# Patient Record
Sex: Female | Born: 1960 | Race: Asian | Hispanic: No | Marital: Married | Smoking: Never smoker
Health system: Southern US, Community
[De-identification: ages and names within clinical notes are randomized; demographics above are authoritative.]

## PROBLEM LIST (undated history)

## (undated) DIAGNOSIS — M1990 Unspecified osteoarthritis, unspecified site: Secondary | ICD-10-CM

## (undated) DIAGNOSIS — R002 Palpitations: Secondary | ICD-10-CM

## (undated) DIAGNOSIS — J45909 Unspecified asthma, uncomplicated: Secondary | ICD-10-CM

## (undated) DIAGNOSIS — K5900 Constipation, unspecified: Secondary | ICD-10-CM

## (undated) DIAGNOSIS — Z8709 Personal history of other diseases of the respiratory system: Secondary | ICD-10-CM

## (undated) DIAGNOSIS — N979 Female infertility, unspecified: Secondary | ICD-10-CM

## (undated) DIAGNOSIS — Z8619 Personal history of other infectious and parasitic diseases: Secondary | ICD-10-CM

## (undated) DIAGNOSIS — L409 Psoriasis, unspecified: Secondary | ICD-10-CM

## (undated) DIAGNOSIS — Z8489 Family history of other specified conditions: Secondary | ICD-10-CM

## (undated) DIAGNOSIS — R51 Headache: Secondary | ICD-10-CM

## (undated) DIAGNOSIS — F329 Major depressive disorder, single episode, unspecified: Secondary | ICD-10-CM

## (undated) DIAGNOSIS — M62838 Other muscle spasm: Secondary | ICD-10-CM

## (undated) DIAGNOSIS — R05 Cough: Secondary | ICD-10-CM

## (undated) DIAGNOSIS — K219 Gastro-esophageal reflux disease without esophagitis: Secondary | ICD-10-CM

## (undated) DIAGNOSIS — L4050 Arthropathic psoriasis, unspecified: Secondary | ICD-10-CM

## (undated) DIAGNOSIS — T7840XA Allergy, unspecified, initial encounter: Secondary | ICD-10-CM

## (undated) DIAGNOSIS — K829 Disease of gallbladder, unspecified: Secondary | ICD-10-CM

## (undated) DIAGNOSIS — R519 Headache, unspecified: Secondary | ICD-10-CM

## (undated) DIAGNOSIS — M2540 Effusion, unspecified joint: Secondary | ICD-10-CM

## (undated) DIAGNOSIS — M2550 Pain in unspecified joint: Secondary | ICD-10-CM

## (undated) DIAGNOSIS — R059 Cough, unspecified: Secondary | ICD-10-CM

## (undated) DIAGNOSIS — F32A Depression, unspecified: Secondary | ICD-10-CM

## (undated) DIAGNOSIS — I10 Essential (primary) hypertension: Secondary | ICD-10-CM

## (undated) DIAGNOSIS — M7989 Other specified soft tissue disorders: Secondary | ICD-10-CM

## (undated) DIAGNOSIS — J189 Pneumonia, unspecified organism: Secondary | ICD-10-CM

## (undated) DIAGNOSIS — R0602 Shortness of breath: Secondary | ICD-10-CM

## (undated) DIAGNOSIS — G8929 Other chronic pain: Secondary | ICD-10-CM

## (undated) DIAGNOSIS — M549 Dorsalgia, unspecified: Secondary | ICD-10-CM

## (undated) DIAGNOSIS — F419 Anxiety disorder, unspecified: Secondary | ICD-10-CM

## (undated) DIAGNOSIS — R7303 Prediabetes: Secondary | ICD-10-CM

## (undated) DIAGNOSIS — M797 Fibromyalgia: Secondary | ICD-10-CM

## (undated) DIAGNOSIS — E559 Vitamin D deficiency, unspecified: Secondary | ICD-10-CM

## (undated) DIAGNOSIS — D649 Anemia, unspecified: Secondary | ICD-10-CM

## (undated) DIAGNOSIS — E039 Hypothyroidism, unspecified: Secondary | ICD-10-CM

## (undated) HISTORY — DX: Shortness of breath: R06.02

## (undated) HISTORY — PX: DILATION AND CURETTAGE OF UTERUS: SHX78

## (undated) HISTORY — DX: Anemia, unspecified: D64.9

## (undated) HISTORY — DX: Vitamin D deficiency, unspecified: E55.9

## (undated) HISTORY — DX: Unspecified asthma, uncomplicated: J45.909

## (undated) HISTORY — PX: DIAGNOSTIC LAPAROSCOPY: SUR761

## (undated) HISTORY — DX: Psoriasis, unspecified: L40.9

## (undated) HISTORY — DX: Female infertility, unspecified: N97.9

## (undated) HISTORY — DX: Constipation, unspecified: K59.00

## (undated) HISTORY — DX: Hypothyroidism, unspecified: E03.9

## (undated) HISTORY — PX: APPENDECTOMY: SHX54

## (undated) HISTORY — DX: Other specified soft tissue disorders: M79.89

## (undated) HISTORY — DX: Depression, unspecified: F32.A

## (undated) HISTORY — PX: REPLACEMENT TOTAL KNEE: SUR1224

## (undated) HISTORY — DX: Prediabetes: R73.03

## (undated) HISTORY — DX: Arthropathic psoriasis, unspecified: L40.50

## (undated) HISTORY — DX: Palpitations: R00.2

## (undated) HISTORY — DX: Disease of gallbladder, unspecified: K82.9

## (undated) HISTORY — DX: Dorsalgia, unspecified: M54.9

## (undated) HISTORY — DX: Gastro-esophageal reflux disease without esophagitis: K21.9

## (undated) HISTORY — DX: Anxiety disorder, unspecified: F41.9

## (undated) HISTORY — PX: TONSILLECTOMY: SUR1361

---
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
## 1998-07-12 ENCOUNTER — Other Ambulatory Visit: Admission: RE | Admit: 1998-07-12 | Discharge: 1998-07-12 | Payer: Self-pay | Admitting: Obstetrics and Gynecology

## 1998-08-12 ENCOUNTER — Ambulatory Visit (HOSPITAL_COMMUNITY): Admission: RE | Admit: 1998-08-12 | Discharge: 1998-08-12 | Payer: Self-pay | Admitting: Obstetrics and Gynecology

## 1998-12-01 ENCOUNTER — Ambulatory Visit (HOSPITAL_COMMUNITY): Admission: RE | Admit: 1998-12-01 | Discharge: 1998-12-01 | Payer: Self-pay | Admitting: Internal Medicine

## 1998-12-02 ENCOUNTER — Encounter: Payer: Self-pay | Admitting: Internal Medicine

## 1999-07-20 ENCOUNTER — Other Ambulatory Visit: Admission: RE | Admit: 1999-07-20 | Discharge: 1999-07-20 | Payer: Self-pay | Admitting: Obstetrics and Gynecology

## 1999-09-17 ENCOUNTER — Encounter (INDEPENDENT_AMBULATORY_CARE_PROVIDER_SITE_OTHER): Payer: Self-pay

## 1999-09-17 ENCOUNTER — Ambulatory Visit (HOSPITAL_COMMUNITY): Admission: RE | Admit: 1999-09-17 | Discharge: 1999-09-17 | Payer: Self-pay | Admitting: Obstetrics and Gynecology

## 2000-07-25 ENCOUNTER — Other Ambulatory Visit: Admission: RE | Admit: 2000-07-25 | Discharge: 2000-07-25 | Payer: Self-pay | Admitting: Obstetrics and Gynecology

## 2001-08-22 ENCOUNTER — Other Ambulatory Visit: Admission: RE | Admit: 2001-08-22 | Discharge: 2001-08-22 | Payer: Self-pay | Admitting: Obstetrics and Gynecology

## 2006-07-10 HISTORY — PX: CHOLECYSTECTOMY: SHX55

## 2006-09-21 ENCOUNTER — Encounter: Admission: RE | Admit: 2006-09-21 | Discharge: 2006-09-21 | Payer: Self-pay | Admitting: Gastroenterology

## 2006-10-23 ENCOUNTER — Ambulatory Visit (HOSPITAL_COMMUNITY): Admission: RE | Admit: 2006-10-23 | Discharge: 2006-10-23 | Payer: Self-pay | Admitting: General Surgery

## 2006-10-23 ENCOUNTER — Encounter (INDEPENDENT_AMBULATORY_CARE_PROVIDER_SITE_OTHER): Payer: Self-pay | Admitting: Specialist

## 2007-06-08 ENCOUNTER — Emergency Department (HOSPITAL_COMMUNITY): Admission: EM | Admit: 2007-06-08 | Discharge: 2007-06-08 | Payer: Self-pay | Admitting: *Deleted

## 2008-05-17 ENCOUNTER — Encounter: Admission: RE | Admit: 2008-05-17 | Discharge: 2008-05-17 | Payer: Self-pay | Admitting: Orthopedic Surgery

## 2008-05-29 ENCOUNTER — Ambulatory Visit (HOSPITAL_BASED_OUTPATIENT_CLINIC_OR_DEPARTMENT_OTHER): Admission: RE | Admit: 2008-05-29 | Discharge: 2008-05-29 | Payer: Self-pay | Admitting: Orthopedic Surgery

## 2008-09-04 ENCOUNTER — Encounter: Admission: RE | Admit: 2008-09-04 | Discharge: 2008-09-04 | Payer: Self-pay | Admitting: Occupational Medicine

## 2008-10-09 ENCOUNTER — Emergency Department (HOSPITAL_COMMUNITY): Admission: EM | Admit: 2008-10-09 | Discharge: 2008-10-09 | Payer: Self-pay | Admitting: Emergency Medicine

## 2009-07-10 HISTORY — PX: KNEE ARTHROSCOPY: SHX127

## 2009-07-26 ENCOUNTER — Ambulatory Visit (HOSPITAL_COMMUNITY): Admission: RE | Admit: 2009-07-26 | Discharge: 2009-07-26 | Payer: Self-pay | Admitting: Surgery

## 2009-07-30 ENCOUNTER — Ambulatory Visit (HOSPITAL_COMMUNITY): Admission: RE | Admit: 2009-07-30 | Discharge: 2009-07-30 | Payer: Self-pay | Admitting: Surgery

## 2010-07-31 ENCOUNTER — Encounter: Payer: Self-pay | Admitting: Gastroenterology

## 2010-11-22 NOTE — Op Note (Signed)
NAME:  Natasha Lyons, Natasha Lyons          ACCOUNT NO.:  [id number]   MEDICAL RECORD NO.:  [id number]          PATIENT TYPE:  AMB   LOCATION:  DSC                          FACILITY:  [facility]   PHYSICIAN:  Harvie Junior, M.D.   DATE OF BIRTH:  1960-10-13   DATE OF PROCEDURE:  05/29/2008  DATE OF DISCHARGE:                               OPERATIVE REPORT   PREOPERATIVE DIAGNOSIS:  Medial meniscal tear and lateral meniscus tear.   POSTOPERATIVE DIAGNOSES:  1. Lateral meniscal tear.  2. Medial femoral condylar defect.  3. Chondromalacia of the trochlea.   PROCEDURES:  1. Partial lateral meniscectomy.  2. Medial femoral chondroplasty.  3. Chondroplasty of the trochlea.   SURGEON:  Harvie Junior, MD   ASSISTANT:  Marshia Ly, PA   ANESTHESIA:  General.   BRIEF HISTORY:  Ms. Belleville is a 50 year old female with a history of  having had significant left knee pain.  We treated conservatively for a  period of time.  Because of continued complaints of left knee  pain, she  was taken to the operating room for operative knee arthroscopy.  After  failure of conservative care, she was brought to the operating room for  this procedure.   PROCEDURE IN DETAIL:  The patient was taken to the operating room.  After adequate anesthesia obtained with general anesthetic, the patient  was placed supine on the operating room table.  The left leg was prepped  and draped in usual sterile fashion.  Following the routine arthroscopic  examination, the knee revealed there was medial femoral condylar defect.  This was debrided with a smooth and stable rim back to a smooth surface.  Once this was completed, attention was turned to the medial meniscus,  which was probed at length.  As I had talked about some undersurface  tears on her MRI.  The posterior horn was probed at length and felt to  be within normal limits.  There was some degenerative feel, but  certainly not a frank tear and felt giving her size and  the condylar  defect that she needed to preserve the meniscus and at this point, no  action was taken at the meniscus.  Attention was turned to the ACL,  which was normal.  Attention was turned to the lateral meniscus where  MRI talked about the mid body changes, the mid body was fine, was probed  at length.  There was a small tear of the posterior horn right at the  root, which was debrided.  Attention was turned to the lateral femoral  condyle, which had no significant condylar defect.  Attention was turned  up to the patellofemoral joint where there was chondral injury of the  patellofemoral trochlea, which was debrided with a suction shaver.  Once  this was completed, a final check was made for loosening and fragment  pieces and seeing none, the knee was copiously and thoroughly irrigated  and suctioned dry.  The  arthroscopic portals were closed with bandage.  Sterile compression  dressing was applied and the patient was taken to the recovery room and  was noted to be  in satisfactory condition.   Estimated blood loss for the procedure was none.      Harvie Junior, M.D.  Electronically Signed     JLG/MEDQ  D:  05/29/2008  T:  05/29/2008  Job:  [id number]

## 2010-11-25 NOTE — Op Note (Signed)
NAME:  Natasha Lyons, Natasha Lyons          ACCOUNT NO.:  [id number]   MEDICAL RECORD NO.:  [id number]          PATIENT TYPE:  AMB   LOCATION:  DAY                          FACILITY:  [facility]   PHYSICIAN:  Timothy E. Earlene Plater, M.D. DATE OF BIRTH:  Jun 15, 1961   DATE OF PROCEDURE:  10/23/2006  DATE OF DISCHARGE:                               OPERATIVE REPORT   PREOPERATIVE DIAGNOSIS:  Cholecystolithiasis.   POSTOPERATIVE DIAGNOSIS:  Cholecystolithiasis.   PROCEDURE:  Laparoscopic cholecystectomy with cholangiogram.   SURGEON:  Timothy E. Earlene Plater, M.D.   ASSISTANT:  Clovis Pu. Cornett, M.D.   ANESTHESIA:  Generalized.   INDICATION:  The patient is symptomatic with gallstones, known obesity,  hypertension, and other stable medical conditions.  She has agreed to  proceed with this operation as planned.  She was seen and identified,  and the permit signed.   PROCEDURE IN DETAIL:  The patient was taken to the operating room and  placed supine.  General endotracheal anesthesia was administered.  The  abdomen was prepped and draped in the usual fashion.  I elected to go  above the umbilicus after infiltrating the skin.  The fascia was  identified and opened in the midline and the peritoneum entered.  I did  buttonhole the umbilical skin, which I repaired with a 3-0 Monocryl.  The [facility] catheter was placed, and tied in place with a #1 Vicryl.  The  abdomen was insufflated.  General peritoneoscopy was unremarkable,  except for a chronically inflamed gallbladder and some adhesions to the  lower midline.  Attention was turned to the gallbladder.  A second 10-mm  trocar in midepigastrium, two 5-mm trocars in the right upper quadrant.  The gallbladder was grasped, placed on tension.  Careful dissection at  the base of the gallbladder revealed a normal-appearing cystic duct.  It  was completely dissected out as well behind the cystic artery.  A clear  window was seen.  A clip was placed on the gallbladder  side of the  cystic duct.  The cystic duct was opened.  Its lumen was tiny, perhaps 1  mm.  I was able to thread a catheter past percutaneously into the cystic  duct remnant and a clip applied and real time cholangiography showing a  rapid and complete filling of the biliary tree and into the duodenum.  The clip and catheter were removed.  The stump of the cystic duct was  doubly clipped and divided.  The cystic artery was triply clipped and  divided.  The gallbladder was dissected from the gallbladder bed without  incident or complication.  The gallbladder bed was carefully checked and  cautery used where necessary.  The gallbladder was placed in an  EndoCatch bag, and then that was passed from the abdomen through the  supraumbilical incision.  That incision was  closed with the existing #1 Vicryl.  All areas were checked, and then  all irrigant, CO2, instruments, and trocars were removed under direct  vision.  the wounds were closed with momocrile. pt removed to the  recovery room.  instructions were given and she will be followed as op  Timothy E. Earlene Plater, M.D.  Electronically Signed     TED/MEDQ  D:  10/23/2006  T:  10/23/2006  Job:  [id number]

## 2010-11-25 NOTE — Op Note (Signed)
[facility]  Patient:    Natasha Lyons, Natasha Lyons                 MRN: [id number] Proc. Date: 09/17/99 Adm. Date:  [id number] Disc. Date: [id number] Attending:  Cordelia Pen Ii                           Operative Report  PREOPERATIVE DIAGNOSIS:       Endometriosis.  Menorrhagia.  POSTOPERATIVE DIAGNOSIS:      Endometriosis.  Pelvic adhesions.  Endometrial polyps.  OPERATION:                    Laparoscopy with ablation of endometriosis and lysis of adhesions.  Hysteroscopy with dilatation and curettage.  SURGEON:                      Guy Sandifer. Arleta Creek, M.D.  ASSISTANT:  ANESTHESIA:                   General with endotracheal intubation by Octaviano Glow.  Pamalee Leyden, M.D.  ESTIMATED BLOOD LOSS:         Less than 50 cc.  SORBITOL:                     I&Os are even.  INDICATIONS:                  This patient is a 50 year old married black female, gravida 0, para 0, with known endometriosis, increasing pelvic pain, and menorrhagia.  Details are dictated in the history and physical.  Hysteroscopy with D&C and laparoscopy is discussed with the patient.  Possible risks and complications have been discussed including, but not limited to, infection, uterine perforation, bowel, bladder, or ureteral damage, bleeding requiring transfusion of blood products with possible transfusion reactive, HIV, and hepatitis acquisition, DVT, PE, and pneumonia, and hysterectomy.  All questions have been answered and  consent is signed on the chart.  FINDINGS:                     The uterine cavity has multiple polypoid structures approximately 5 to 8 mm in length.  These are found in the uterine fundus as well as bilaterally on the superior lateral walls of the endometrial canal.  The upper abdomen is normal.  There are omental adhesions to the anterior abdominal wall ver the site of her previous appendectomy.  The uterus is upper limits of  normal size, smooth in contour.  The anterior cul-de-sac is normal.  The right tube and ovary are normal.  The left tube contains a small hydatid cyst.  The left pelvic side  wall contains several small white patches of endometriosis.  The right pelvic side wall contains a single raised, berry-shaped lesion of endometriosis approximately 5 mm in diameter.  The posterior cul-de-sac is normal.  DESCRIPTION OF PROCEDURE:     The patient is taken to the operating room where he is placed in the dorsal supine position.  General anesthesia is then induced via endotracheal intubation.  She is then placed in the dorsal lithotomy position where she is prepped abdominally, vaginally, the bladder is straight catheterized, and she is draped in a sterile fashion.  Bivalve speculum is placed in the vagina and the cervix is injected with 1% Xylocaine and then grasped with a single tooth tenaculum.  Paracervical block is placed at  the 2, 4, 5, 7, 8, and 10 oclock positions with 1% Xylocaine.  The cervix is gently and progressively dilated to a 29 Pratt dilator.  Diagnostic hysteroscope is then placed in the cervix and advanced under direct visualization using Sorbitol distending media.  The above  findings are noted.  The hysteroscope is withdrawn and thorough sharp curettage is done.  Reinspection with the hysteroscope reveals two or three small polypoid structures remaining.  Sharp curettage is then again done.  Final inspection with the hysteroscope reveals that all the polypoid structures have been removed in their entirety.  Hulka tenaculum is then placed in the uterus as a manipulator.  The single tooth tenaculum is removed and attention is turned to the abdomen. Small infraumbilical incision is made and  10/11 mm disposable trocar sleeve is  placed without difficulty on the first attempt.  Placement is verified with the  laparoscope and no damage to the surrounding structures is  noted.  The pneumoperitoneum is induced.  A small suprapubic incision is made and a 5 mm nondisposable trocar sleeve is placed under direct visualization without difficulty. The above findings are noted.  The adhesions to the anterior abdominal wall are cauterized with bipolar cautery at their point of insertion into the anterior abdominal wall and taken down sharply.  Good hemostasis is obtained. Irrigation is carried out and excess fluid is removed and good hemostasis is again noted at all sites.  The above findings are then noted in the pelvis.  Course of the ureters is carefully identified bilaterally and bipolar cautery is used to ablate the areas of endometriosis.  The hydatid cyst on the left tube is also cauterized.  Excess fluid is removed.  The suprapubic trocar sleeve is removed, the pneumoperitoneum is reduced, and no bleeding is noted.  The umbilical trocar sleeve is removed and the skin incisions are closed with subcuticular 3-0 Vicryl suture. The incisions are injected with 0.5% plain Marcaine.  The Hulka tenaculum is removed and no bleeding is noted.  All counts are correct.  The patient is awakened and taken to the recovery room in stable condition. DD:  09/17/99 TD:  09/18/99 Job: [id number]

## 2011-04-11 LAB — BASIC METABOLIC PANEL
BUN: 7
CO2: 27
Calcium: 9
Chloride: 106
Creatinine, Ser: 0.58
GFR calc Af Amer: >60
GFR calc non Af Amer: >60
Glucose, Bld: 105 — ABNORMAL HIGH
Potassium: 4
Sodium: 138

## 2011-04-11 LAB — GLUCOSE, CAPILLARY
Glucose-Capillary: 123 — ABNORMAL HIGH
Glucose-Capillary: 127 — ABNORMAL HIGH

## 2011-04-11 LAB — POCT HEMOGLOBIN-HEMACUE: Hemoglobin: 14.2

## 2012-06-27 ENCOUNTER — Other Ambulatory Visit: Payer: Self-pay | Admitting: Orthopedic Surgery

## 2012-06-27 DIAGNOSIS — M545 Low back pain, unspecified: Secondary | ICD-10-CM

## 2012-07-05 ENCOUNTER — Ambulatory Visit
Admission: RE | Admit: 2012-07-05 | Discharge: 2012-07-05 | Disposition: A | Discharge status: Home / Self Care | Payer: BC Managed Care – PPO | Source: Ambulatory Visit | Attending: Orthopedic Surgery | Admitting: Orthopedic Surgery

## 2012-07-05 DIAGNOSIS — M545 Low back pain, unspecified: Secondary | ICD-10-CM

## 2012-07-10 HISTORY — PX: BIOPSY THYROID: PRO38

## 2013-01-09 ENCOUNTER — Other Ambulatory Visit: Payer: Self-pay | Admitting: *Deleted

## 2013-01-09 MED ORDER — PHENTERMINE-TOPIRAMATE ER 7.5-46 MG PO CP24: 1.0000 | ORAL_CAPSULE | Freq: Every day | ORAL | Status: DC

## 2013-01-09 NOTE — Telephone Encounter (Signed)
Ok per Dr Lucianne Muss.

## 2013-02-24 ENCOUNTER — Other Ambulatory Visit: Payer: Self-pay | Admitting: *Deleted

## 2013-02-24 ENCOUNTER — Other Ambulatory Visit (INDEPENDENT_AMBULATORY_CARE_PROVIDER_SITE_OTHER): Payer: BC Managed Care – PPO

## 2013-02-24 DIAGNOSIS — R7303 Prediabetes: Secondary | ICD-10-CM

## 2013-02-24 DIAGNOSIS — E785 Hyperlipidemia, unspecified: Secondary | ICD-10-CM

## 2013-02-24 DIAGNOSIS — R7309 Other abnormal glucose: Secondary | ICD-10-CM

## 2013-02-24 DIAGNOSIS — E039 Hypothyroidism, unspecified: Secondary | ICD-10-CM

## 2013-02-24 LAB — LIPID PANEL
Cholesterol: 129 mg/dL (ref 0–200)
HDL: 31 mg/dL — ABNORMAL LOW (ref >39.00)
LDL Cholesterol: 84 mg/dL (ref 0–99)
Total CHOL/HDL Ratio: 4
Triglycerides: 68 mg/dL (ref 0.0–149.0)
VLDL: 13.6 mg/dL (ref 0.0–40.0)

## 2013-02-24 LAB — COMPREHENSIVE METABOLIC PANEL
ALT: 24 U/L (ref 0–35)
AST: 23 U/L (ref 0–37)
Albumin: 4 g/dL (ref 3.5–5.2)
Alkaline Phosphatase: 57 U/L (ref 39–117)
BUN: 9 mg/dL (ref 6–23)
CO2: 25 mEq/L (ref 19–32)
Calcium: 9 mg/dL (ref 8.4–10.5)
Chloride: 108 mEq/L (ref 96–112)
Creatinine, Ser: 0.6 mg/dL (ref 0.4–1.2)
GFR: 138.63 mL/min (ref >60.00)
Glucose, Bld: 107 mg/dL — ABNORMAL HIGH (ref 70–99)
Potassium: 4 mEq/L (ref 3.5–5.1)
Sodium: 138 mEq/L (ref 135–145)
Total Bilirubin: 0.6 mg/dL (ref 0.3–1.2)
Total Protein: 7.2 g/dL (ref 6.0–8.3)

## 2013-02-24 LAB — VITAMIN B12: Vitamin B-12: >1500 pg/mL — ABNORMAL HIGH (ref 211–911)

## 2013-02-24 LAB — HEMOGLOBIN A1C: Hgb A1c MFr Bld: 5.9 % (ref 4.6–6.5)

## 2013-02-24 LAB — T4, FREE: Free T4: 1.06 ng/dL (ref 0.60–1.60)

## 2013-02-24 LAB — TSH: TSH: 0.15 u[IU]/mL — ABNORMAL LOW (ref 0.35–5.50)

## 2013-02-25 ENCOUNTER — Ambulatory Visit: Payer: BC Managed Care – PPO | Admitting: Endocrinology

## 2013-02-25 LAB — VITAMIN D 25 HYDROXY (VIT D DEFICIENCY, FRACTURES): Vit D, 25-Hydroxy: 40 ng/mL (ref 30–89)

## 2013-02-27 ENCOUNTER — Encounter: Payer: Self-pay | Admitting: Endocrinology

## 2013-02-27 ENCOUNTER — Ambulatory Visit (INDEPENDENT_AMBULATORY_CARE_PROVIDER_SITE_OTHER): Payer: BC Managed Care – PPO | Admitting: Endocrinology

## 2013-02-27 VITALS — BP 138/70 | HR 86 | Temp 98.3°F | Resp 12 | Ht 64.0 in | Wt 224.2 lb

## 2013-02-27 DIAGNOSIS — I10 Essential (primary) hypertension: Secondary | ICD-10-CM

## 2013-02-27 DIAGNOSIS — R7309 Other abnormal glucose: Secondary | ICD-10-CM

## 2013-02-27 DIAGNOSIS — J45909 Unspecified asthma, uncomplicated: Secondary | ICD-10-CM

## 2013-02-27 DIAGNOSIS — E669 Obesity, unspecified: Secondary | ICD-10-CM

## 2013-02-27 DIAGNOSIS — R7303 Prediabetes: Secondary | ICD-10-CM | POA: Insufficient documentation

## 2013-02-27 DIAGNOSIS — E039 Hypothyroidism, unspecified: Secondary | ICD-10-CM

## 2013-02-27 NOTE — Progress Notes (Signed)
Patient ID: Natasha Lyons, female   DOB: 03-23-1961, 52 y.o.   MRN: [id number]   History of Present Illness:  She has had multiple medical problems and is here for followup:  1. OBESITY: Because of failure of diet and exercise to help her lose weight she was started on Qsymia a few months ago and has had excellent results with continued weight loss. Her maximum weight has been about 258 in the past. She has also been able to get more active with walking and running although unable to do this recently with joint pain. Has good portion control and has very little craving for sweets and carbohydrates well. Has lost 15 pounds in the last 2 months also. 2.  Prediabetes: She has been on metformin previously since 2009 with mostly impaired fasting glucose and normal A1c but recently has stopped taking this on her own. However fasting glucose is still relatively high at 107 3. HYPOTHYROIDISM: She has had primary hypothyroidism since about 2006. She does feel subjectively better with taking Armour Thyroid compared to Synthroid. No unusual fatigue or cold intolerance. No palpitations but her TSH is now relatively low 4. HYPERTENSION: She has been on treatment for the last few years and has been on stable doses of Micardis. Blood pressure is higher and she has not taken her medication. Usually blood pressure at work or otherwise is 125-30/80. No dizziness. She does not know what her pulse is at home but it is relatively fast today  5. Vitamin B12 deficiency: This appears to have resolved with stopping metformin and level is normal      Medication List       This list is accurate as of: 02/27/13 11:59 PM.  Always use your most recent med list.               cetirizine 10 MG tablet  Commonly known as:  ZYRTEC  Take 10 mg by mouth daily.     etanercept 50 MG/ML injection  Commonly known as:  ENBREL  Inject 50 mg into the skin once a week.     mometasone 220 MCG/INH inhaler  Commonly known as:   ASMANEX  Inhale 2 puffs into the lungs daily.     Phentermine-Topiramate 7.5-46 MG Cp24  Commonly known as:  QSYMIA  Take 1 capsule by mouth daily.     PROVENTIL HFA 108 (90 BASE) MCG/ACT inhaler  Generic drug:  albuterol  Inhale 2 puffs into the lungs every 6 (six) hours as needed for wheezing.     telmisartan 40 MG tablet  Commonly known as:  MICARDIS  Take 40 mg by mouth daily.     thyroid 90 MG tablet  Commonly known as:  ARMOUR  Take 90 mg by mouth daily.     vitamin B-12 500 MCG tablet  Commonly known as:  CYANOCOBALAMIN  Take 500 mcg by mouth daily.     Vitamin D (Ergocalciferol) 50000 UNITS Caps capsule  Commonly known as:  DRISDOL  Take 50,000 Units by mouth.        Allergies: Allergies not on file  No past medical history on file.  No past surgical history on file.  No family history on file.  Social History:  reports that she has never smoked. She has never used smokeless tobacco. Her alcohol and drug histories are not on file.  Review of Systems -   Has had history of severe psoriasis, is on treatment History of vitamin D deficiency, taking 50,000 units  weekly Had low HDL, LDL is normal   Appointment on 02/24/2013  Component Date Value Range Status  . Hemoglobin A1C 02/24/2013 5.9  4.6 - 6.5 % Final   Glycemic Control Guidelines for People with Diabetes:Non Diabetic:  <6%Goal of Therapy: <7%Additional Action Suggested:  >8%   . Sodium 02/24/2013 138  135 - 145 mEq/L Final  . Potassium 02/24/2013 4.0  3.5 - 5.1 mEq/L Final  . Chloride 02/24/2013 108  96 - 112 mEq/L Final  . CO2 02/24/2013 25  19 - 32 mEq/L Final  . Glucose, Bld 02/24/2013 107* 70 - 99 mg/dL Final  . BUN 16/04/9603 9  6 - 23 mg/dL Final  . Creatinine, Ser 02/24/2013 0.6  0.4 - 1.2 mg/dL Final  . Total Bilirubin 02/24/2013 0.6  0.3 - 1.2 mg/dL Final  . Alkaline Phosphatase 02/24/2013 57  39 - 117 U/L Final  . AST 02/24/2013 23  0 - 37 U/L Final  . ALT 02/24/2013 24  0 - 35 U/L  Final  . Total Protein 02/24/2013 7.2  6.0 - 8.3 g/dL Final  . Albumin 54/03/8118 4.0  3.5 - 5.2 g/dL Final  . Calcium 14/78/2956 9.0  8.4 - 10.5 mg/dL Final  . GFR 21/30/8657 138.63  >60.00 mL/min Final  . Vit D, 25-Hydroxy 02/24/2013 40  30 - 89 ng/mL Final   Comment: This assay accurately quantifies Vitamin D, which is the sum of the                          25-Hydroxy forms of Vitamin D2 and D3.  Studies have shown that the                          optimum concentration of 25-Hydroxy Vitamin D is 30 ng/mL or higher.                           Concentrations of Vitamin D between 20 and 29 ng/mL are considered to                          be insufficient and concentrations less than 20 ng/mL are considered                          to be deficient for Vitamin D.  . Vitamin B-12 02/24/2013 >1500* 211 - 911 pg/mL Final  . Free T4 02/24/2013 1.06  0.60 - 1.60 ng/dL Final  . TSH 84/69/6295 0.15* 0.35 - 5.50 uIU/mL Final  . Cholesterol 02/24/2013 129  0 - 200 mg/dL Final   ATP III Classification       Desirable:  < 200 mg/dL               Borderline High:  200 - 239 mg/dL          High:  > = 284 mg/dL  . Triglycerides 02/24/2013 68.0  0.0 - 149.0 mg/dL Final   Normal:  <132 mg/dLBorderline High:  150 - 199 mg/dL  . HDL 02/24/2013 31.00* >39.00 mg/dL Final  . VLDL 44/07/270 13.6  0.0 - 40.0 mg/dL Final  . LDL Cholesterol 02/24/2013 84  0 - 99 mg/dL Final  . Total CHOL/HDL Ratio 02/24/2013 4   Final  Men          Women1/2 Average Risk     3.4          3.3Average Risk          5.0          4.42X Average Risk          9.6          7.13X Average Risk          15.0          11.0                        EXAM:  BP 138/70  Pulse 86  Temp(Src) 98.3 F (36.8 C)  Resp 12  Ht 5\' 4"  (1.626 m)  Wt 224 lb 3.2 oz (101.696 kg)  BMI 38.46 kg/m2  SpO2 99%  LMP 02/07/2013 Pulse 76 on repeat measurement No pedal edema  Assessment/Plan:   See history of present illness for list of  problems. Changes made to her regimen: She will leave off B12 as the level is now normal without taking metformin. A1c is upper normal She will reduce her Armour Thyroid by half tablet twice a week. Resume exercise, avoid running which will affect her knees Consider lipoprotein NMR analysis Followup TSH short-term Consider reducing dose of Qsymia   Bjorn Hallas 03/06/2013, 7:56 AM

## 2013-02-27 NOTE — Patient Instructions (Addendum)
Armour thyroid 1/2  Twice a week and 1 daily other days  Watch pulse and call if consistently over 80  Monitor blood pressure at least every other week  Resume his walking

## 2013-03-06 ENCOUNTER — Encounter: Payer: Self-pay | Admitting: Endocrinology

## 2013-03-06 DIAGNOSIS — E039 Hypothyroidism, unspecified: Secondary | ICD-10-CM | POA: Insufficient documentation

## 2013-03-06 DIAGNOSIS — J45909 Unspecified asthma, uncomplicated: Secondary | ICD-10-CM | POA: Insufficient documentation

## 2013-03-06 DIAGNOSIS — E669 Obesity, unspecified: Secondary | ICD-10-CM | POA: Insufficient documentation

## 2013-03-06 DIAGNOSIS — I10 Essential (primary) hypertension: Secondary | ICD-10-CM | POA: Insufficient documentation

## 2013-03-19 ENCOUNTER — Other Ambulatory Visit: Payer: Self-pay | Admitting: Internal Medicine

## 2013-03-19 DIAGNOSIS — E010 Iodine-deficiency related diffuse (endemic) goiter: Secondary | ICD-10-CM

## 2013-03-26 ENCOUNTER — Ambulatory Visit
Admission: RE | Admit: 2013-03-26 | Discharge: 2013-03-26 | Disposition: A | Discharge status: Home / Self Care | Payer: BC Managed Care – PPO | Source: Ambulatory Visit | Attending: Internal Medicine | Admitting: Internal Medicine

## 2013-03-26 DIAGNOSIS — E010 Iodine-deficiency related diffuse (endemic) goiter: Secondary | ICD-10-CM

## 2013-04-08 ENCOUNTER — Other Ambulatory Visit: Payer: Self-pay | Admitting: Internal Medicine

## 2013-04-08 DIAGNOSIS — E041 Nontoxic single thyroid nodule: Secondary | ICD-10-CM

## 2013-04-09 ENCOUNTER — Telehealth: Payer: Self-pay | Admitting: *Deleted

## 2013-04-09 ENCOUNTER — Ambulatory Visit
Admission: RE | Admit: 2013-04-09 | Discharge: 2013-04-09 | Disposition: A | Discharge status: Home / Self Care | Payer: BC Managed Care – PPO | Source: Ambulatory Visit | Attending: Internal Medicine | Admitting: Internal Medicine

## 2013-04-09 ENCOUNTER — Other Ambulatory Visit (HOSPITAL_COMMUNITY)
Admission: RE | Admit: 2013-04-09 | Discharge: 2013-04-09 | Disposition: A | Discharge status: Home / Self Care | Payer: BC Managed Care – PPO | Source: Ambulatory Visit | Attending: Diagnostic Radiology | Admitting: Diagnostic Radiology

## 2013-04-09 DIAGNOSIS — E041 Nontoxic single thyroid nodule: Secondary | ICD-10-CM

## 2013-04-09 NOTE — Telephone Encounter (Signed)
Pts mom has an appt on Friday, wants to know if she can have her labs drawn on the same day?   Patient also says since she's lost so much weight her bp has come down and she's stopped taking her meds,  She also wants to know if she should wean herself off the Qysmia?  Please advise

## 2013-04-09 NOTE — Telephone Encounter (Signed)
Okay to have labs, will need followup visit however She can take Qsymia every other day and then stop after 10 days

## 2013-04-09 NOTE — Telephone Encounter (Signed)
What about her bp meds?

## 2013-04-09 NOTE — Telephone Encounter (Signed)
Noted, pt is aware 

## 2013-04-09 NOTE — Telephone Encounter (Signed)
None

## 2013-04-11 ENCOUNTER — Other Ambulatory Visit: Payer: Self-pay | Admitting: Endocrinology

## 2013-04-11 LAB — BASIC METABOLIC PANEL
BUN: 6 mg/dL (ref 6–23)
CO2: 25 mEq/L (ref 19–32)
Calcium: 9.6 mg/dL (ref 8.4–10.5)
Chloride: 106 mEq/L (ref 96–112)
Creat: 0.53 mg/dL (ref 0.50–1.10)
Glucose, Bld: 93 mg/dL (ref 70–99)
Potassium: 3.9 mEq/L (ref 3.5–5.3)
Sodium: 137 mEq/L (ref 135–145)

## 2013-04-12 LAB — TSH: TSH: 1.84 u[IU]/mL (ref 0.350–4.500)

## 2013-04-12 LAB — T4, FREE: Free T4: 1.12 ng/dL (ref 0.80–1.80)

## 2013-04-14 ENCOUNTER — Telehealth: Payer: Self-pay | Admitting: *Deleted

## 2013-04-14 NOTE — Telephone Encounter (Signed)
Message copied by Hermenia Bers on Mon Apr 14, 2013  5:17 PM ------      Message from: Reather Littler      Created: Sat Apr 12, 2013  4:35 PM       Labs okay but need to see her for followup office visit within the next week, next appointment is next month ------

## 2013-04-14 NOTE — Telephone Encounter (Signed)
Left message to return call 

## 2013-04-22 ENCOUNTER — Telehealth: Payer: Self-pay | Admitting: *Deleted

## 2013-04-22 NOTE — Telephone Encounter (Signed)
Left message to return call and schedule appt

## 2013-05-02 ENCOUNTER — Ambulatory Visit (INDEPENDENT_AMBULATORY_CARE_PROVIDER_SITE_OTHER): Payer: BC Managed Care – PPO | Admitting: Endocrinology

## 2013-05-02 ENCOUNTER — Encounter: Payer: Self-pay | Admitting: Endocrinology

## 2013-05-02 VITALS — BP 120/70 | HR 75 | Temp 97.7°F | Ht 64.0 in | Wt 203.6 lb

## 2013-05-02 DIAGNOSIS — Z23 Encounter for immunization: Secondary | ICD-10-CM

## 2013-05-02 DIAGNOSIS — E669 Obesity, unspecified: Secondary | ICD-10-CM

## 2013-05-02 DIAGNOSIS — E041 Nontoxic single thyroid nodule: Secondary | ICD-10-CM

## 2013-05-02 NOTE — Progress Notes (Addendum)
Patient ID: Natasha Lyons, female   DOB: Aug 15, 1960, 52 y.o.   MRN: [id number]  History of Present Illness:  She has had multiple medical problems and is here for followup:  1. OBESITY: Because of failure of diet and exercise to help her lose weight she has been on Qsymia a few months ago and has had excellent results with continued weight loss. Her maximum weight has been about 258 in the past. She has also been able to get more active with walking and running although unable to do this consistently wit because of knee joint pain. Has good portion control and has very little craving for sweets and carbohydrates well. She is now trying to avoid carbohydrates and have smoothies for one or 2 meals a day. With her rapid weight loss with the different diet she wanted to get off Qsymia and has been able to taper it off  Wt Readings from Last 3 Encounters:  05/02/13 203 lb 9.6 oz (92.352 kg)  02/27/13 224 lb 3.2 oz (101.696 kg)    2.  Prediabetes: She had been on metformin previously since 2009 with mostly impaired fasting glucose and normal A1c but she has stopped taking this on her own. Recent glucose 93, previously 107 3. ?  HYPOTHYROIDISM: She reportedly had primary hypothyroidism since about 2006. She felt subjectively better with taking Armour Thyroid compared to Synthroid. Because of low TSH she was told to reduce her dose by 1 tablet a week but she misunderstood and is taking only a half tablet twice a week. Her thyroid level is still normal  4.  HYPERTENSION: She has been on treatment for the last few years and has been able to get off meds, not taking Micardis now for at least a couple of weeks. Blood pressure is still normal. Usually blood pressure at work or otherwise is  118-125/75-80.  5. Thyroid nodule: On the routine exam with a new primary care physician he was found to have mostly cystic thyroid nodule on the left which is benign on biopsy. Has not had left-sided thyroid enlargement  in the past      Medication List       This list is accurate as of: 05/02/13 11:59 PM.  Always use your most recent med list.               cetirizine 10 MG tablet  Commonly known as:  ZYRTEC  Take 10 mg by mouth daily.     etanercept 50 MG/ML injection  Commonly known as:  ENBREL  Inject 50 mg into the skin once a week.     mometasone 220 MCG/INH inhaler  Commonly known as:  ASMANEX  Inhale 2 puffs into the lungs daily.     Phentermine-Topiramate 7.5-46 MG Cp24  Commonly known as:  QSYMIA  Take 1 capsule by mouth daily.     PROVENTIL HFA 108 (90 BASE) MCG/ACT inhaler  Generic drug:  albuterol  Inhale 2 puffs into the lungs every 6 (six) hours as needed for wheezing.     telmisartan 40 MG tablet  Commonly known as:  MICARDIS  Take 40 mg by mouth daily.     thyroid 90 MG tablet  Commonly known as:  ARMOUR  Take 90 mg by mouth daily.     vitamin B-12 500 MCG tablet  Commonly known as:  CYANOCOBALAMIN  Take 500 mcg by mouth daily.     Vitamin D (Ergocalciferol) 50000 UNITS Caps capsule  Commonly known as:  DRISDOL  Take 50,000 Units by mouth.        Allergies: No Known Allergies  Past Medical History  Diagnosis Date  . Asthma     History reviewed. No pertinent past surgical history.  Family History  Problem Relation Age of Onset  . Diabetes Mother   . Hypertension Mother     Social History:  reports that she has never smoked. She has never used smokeless tobacco. Her alcohol and drug histories are not on file.  Review of Systems -   Has had history of severe psoriasis, is on treatment History of vitamin D deficiency, taking 50,000 units weekly Had history of low HDL, LDL is normal  LABS  No visits with results within 1 Week(s) from this visit. Latest known visit with results is:  Orders Only on 04/11/2013  Component Date Value Range Status  . Sodium 04/11/2013 137  135 - 145 mEq/L Final  . Potassium 04/11/2013 3.9  3.5 - 5.3 mEq/L Final   . Chloride 04/11/2013 106  96 - 112 mEq/L Final  . CO2 04/11/2013 25  19 - 32 mEq/L Final  . Glucose, Bld 04/11/2013 93  70 - 99 mg/dL Final  . BUN 16/04/9603 6  6 - 23 mg/dL Final  . Creat 54/03/8118 0.53  0.50 - 1.10 mg/dL Final  . Calcium 14/78/2956 9.6  8.4 - 10.5 mg/dL Final  . TSH 21/30/8657 1.840  0.350 - 4.500 uIU/mL Final  . Free T4 04/11/2013 1.12  0.80 - 1.80 ng/dL Final    EXAM:  BP 846/96  Pulse 75  Temp(Src) 97.7 F (36.5 C) (Oral)  Ht 5\' 4"  (1.626 m)  Wt 203 lb 9.6 oz (92.352 kg)  BMI 34.93 kg/m2  SpO2 98%  She has a 2.5-2 cm smooth left-sided thyroid nodule Right lobe was just palpable, slightly firm  Assessment/Plan:   1. Weight loss: She is now achieving this on her own without any weight loss medications and can continue her diet and exercise regimen 2. Her glucose appears normal now 3. Her thyroid is normal with minimal doses of Armour Thyroid. Most likely did not have primary hypothyroidism but only a goiter in the past. She will continue without thyroid supplement and discussed that are mostly cystic thyroid nodules would not respond to suppressive therapy 4. Blood pressure is still normal without medications, continue monitoring   Kellin Fifer 05/04/2013, 9:45 PM

## 2013-05-02 NOTE — Patient Instructions (Signed)
No thyroid Rx   Check BP weekly

## 2013-05-28 ENCOUNTER — Other Ambulatory Visit: Payer: BC Managed Care – PPO

## 2013-05-30 ENCOUNTER — Ambulatory Visit: Payer: BC Managed Care – PPO | Admitting: Endocrinology

## 2013-05-30 DIAGNOSIS — Z0289 Encounter for other administrative examinations: Secondary | ICD-10-CM

## 2013-07-28 ENCOUNTER — Other Ambulatory Visit: Payer: Self-pay | Admitting: Endocrinology

## 2013-07-28 ENCOUNTER — Other Ambulatory Visit: Payer: BC Managed Care – PPO

## 2013-07-28 LAB — COMPREHENSIVE METABOLIC PANEL
ALT: 11 U/L (ref 0–35)
AST: 16 U/L (ref 0–37)
Albumin: 3.5 g/dL (ref 3.5–5.2)
Alkaline Phosphatase: 54 U/L (ref 39–117)
BUN: 7 mg/dL (ref 6–23)
CO2: 25 mEq/L (ref 19–32)
Calcium: 8.5 mg/dL (ref 8.4–10.5)
Chloride: 107 mEq/L (ref 96–112)
Creat: 0.51 mg/dL (ref 0.50–1.10)
Glucose, Bld: 112 mg/dL — ABNORMAL HIGH (ref 70–99)
Potassium: 4 mEq/L (ref 3.5–5.3)
Sodium: 139 mEq/L (ref 135–145)
Total Bilirubin: 0.4 mg/dL (ref 0.3–1.2)
Total Protein: 6.2 g/dL (ref 6.0–8.3)

## 2013-07-28 LAB — LIPID PANEL
Cholesterol: 149 mg/dL (ref 0–200)
HDL: 42 mg/dL (ref >39)
LDL Cholesterol: 96 mg/dL (ref 0–99)
Total CHOL/HDL Ratio: 3.5 Ratio
Triglycerides: 56 mg/dL (ref <150)
VLDL: 11 mg/dL (ref 0–40)

## 2013-07-28 LAB — T3, FREE: T3, Free: 2.6 pg/mL (ref 2.3–4.2)

## 2013-07-28 LAB — T4, FREE: Free T4: 0.89 ng/dL (ref 0.80–1.80)

## 2013-07-30 LAB — TSH: TSH: 1.984 u[IU]/mL (ref 0.350–4.500)

## 2013-08-08 ENCOUNTER — Other Ambulatory Visit: Payer: Self-pay | Admitting: *Deleted

## 2013-08-08 ENCOUNTER — Encounter: Payer: Self-pay | Admitting: Endocrinology

## 2013-08-08 ENCOUNTER — Ambulatory Visit (INDEPENDENT_AMBULATORY_CARE_PROVIDER_SITE_OTHER): Payer: BC Managed Care – PPO | Admitting: Endocrinology

## 2013-08-08 VITALS — BP 118/74 | HR 68 | Temp 98.2°F | Resp 14 | Ht 64.0 in | Wt 195.8 lb

## 2013-08-08 DIAGNOSIS — R7303 Prediabetes: Secondary | ICD-10-CM

## 2013-08-08 DIAGNOSIS — L408 Other psoriasis: Secondary | ICD-10-CM

## 2013-08-08 DIAGNOSIS — E559 Vitamin D deficiency, unspecified: Secondary | ICD-10-CM

## 2013-08-08 DIAGNOSIS — E041 Nontoxic single thyroid nodule: Secondary | ICD-10-CM

## 2013-08-08 DIAGNOSIS — L409 Psoriasis, unspecified: Secondary | ICD-10-CM

## 2013-08-08 DIAGNOSIS — R7309 Other abnormal glucose: Secondary | ICD-10-CM

## 2013-08-08 MED ORDER — GLUCOSE BLOOD VI STRP: ORAL_STRIP | Status: DC

## 2013-08-08 NOTE — Patient Instructions (Signed)
Please check blood sugars at least half the time about 2 hours after any meal and weekly on waking up. Please bring blood sugar monitor to each visit

## 2013-08-08 NOTE — Progress Notes (Signed)
Patient ID: ILYA ESS, female   DOB: 02-09-1961, 53 y.o.   MRN: [id number]   History of Present Illness:  She has had multiple medical problems and is here for three-month followup:  1. OBESITY: Because of failure of diet and exercise to help her lose weight she tried Qsymia a few months ago with excellent results and significant weight loss. Her maximum weight has been about 258 in the past. She has also been able to get more active with walking and toning exercises despite her joint pains. Has good portion control and has very little craving for sweets and carbohydrates. She stopped Qsymia in 10/14 and has been able to lose more weight with lifestyle changes only  Wt Readings from Last 3 Encounters:  08/08/13 195 lb 12.8 oz (88.814 kg)  05/02/13 203 lb 9.6 oz (92.352 kg)  02/27/13 224 lb 3.2 oz (101.696 kg)    2.  Prediabetes: She had been on metformin previously since 2009 with mostly impaired fasting glucose and normal A1c but she has stopped taking this on her own. Recent fasting glucose 112, previously 93, no recent A1c done recently 3.  Thyroid nodule: On the routine exam with a new primary care physician  was found to have mostly cystic thyroid nodule on the left which is benign on biopsy. Has no local discomfort but she can feel the nodule itself. Also likely she has had hypothyroidism in the past and thyroid levels are quite normal with not taking any supplement 4. History of hypertension: She has been on treatment for the last few years and has been able to get off medications completely with her weight loss. She thinks blood pressure readings have been normal.       Medication List       This list is accurate as of: 08/08/13  4:25 PM.  Always use your most recent med list.               cetirizine 10 MG tablet  Commonly known as:  ZYRTEC  Take 10 mg by mouth daily.     etanercept 50 MG/ML injection  Commonly known as:  ENBREL  Inject 50 mg into the skin once a  week.     Fish Oil 1000 MG Caps  Take by mouth.     mometasone 220 MCG/INH inhaler  Commonly known as:  ASMANEX  Inhale 2 puffs into the lungs daily.     PROVENTIL HFA 108 (90 BASE) MCG/ACT inhaler  Generic drug:  albuterol  Inhale 2 puffs into the lungs every 6 (six) hours as needed for wheezing.     Vitamin D (Ergocalciferol) 50000 UNITS Caps capsule  Commonly known as:  DRISDOL  Take 50,000 Units by mouth.        Allergies: No Known Allergies  Past Medical History  Diagnosis Date  . Asthma     No past surgical history on file.  Family History  Problem Relation Age of Onset  . Diabetes Mother   . Hypertension Mother     Social History:  reports that she has never smoked. She has never used smokeless tobacco. Her alcohol and drug histories are not on file.  Review of Systems -   Has had history of severe psoriasis, is on treatment History of vitamin D deficiency, taking 50,000 units weekly over the last 2 or 3 years with previously normal level Had history of low HDL: This is now improved at 42, LDL is normal  LABS  Orders  Only on 07/28/2013  Component Date Value Range Status  . Free T4 07/28/2013 0.89  0.80 - 1.80 ng/dL Final  Orders Only on 07/28/2013  Component Date Value Range Status  . Cholesterol 07/28/2013 149  0 - 200 mg/dL Final   Comment: ATP III Classification:                                < 200        mg/dL        Desirable                               200 - 239     mg/dL        Borderline High                               >= 240        mg/dL        High                             . Triglycerides 07/28/2013 56  <150 mg/dL Final  . HDL 07/28/2013 42  >39 mg/dL Final  . Total CHOL/HDL Ratio 07/28/2013 3.5   Final  . VLDL 07/28/2013 11  0 - 40 mg/dL Final  . LDL Cholesterol 07/28/2013 96  0 - 99 mg/dL Final   Comment:                            Total Cholesterol/HDL Ratio:CHD Risk                                                 Coronary  Heart Disease Risk Table                                                                 Men       Women                                   1/2 Average Risk              3.4        3.3                                       Average Risk              5.0        4.4                                    2X Average Risk  9.6        7.1                                    3X Average Risk             23.4       11.0                          Use the calculated Patient Ratio above and the CHD Risk table                           to determine the patient's CHD Risk.                          ATP III Classification (LDL):                                < 100        mg/dL         Optimal                               100 - 129     mg/dL         Near or Above Optimal                               130 - 159     mg/dL         Borderline High                               160 - 189     mg/dL         High                                > 190        mg/dL         Very High                             Orders Only on 07/28/2013  Component Date Value Range Status  . Sodium 07/28/2013 139  135 - 145 mEq/L Final  . Potassium 07/28/2013 4.0  3.5 - 5.3 mEq/L Final  . Chloride 07/28/2013 107  96 - 112 mEq/L Final  . CO2 07/28/2013 25  19 - 32 mEq/L Final  . Glucose, Bld 07/28/2013 112* 70 - 99 mg/dL Final  . BUN 07/28/2013 7  6 - 23 mg/dL Final  . Creat 07/28/2013 0.51  0.50 - 1.10 mg/dL Final  . Total Bilirubin 07/28/2013 0.4  0.3 - 1.2 mg/dL Final  . Alkaline Phosphatase 07/28/2013 54  39 - 117 U/L Final  . AST 07/28/2013 16  0 - 37 U/L Final  . ALT 07/28/2013 11  0 - 35 U/L Final  . Total Protein 07/28/2013 6.2  6.0 - 8.3 g/dL Final  . Albumin 07/28/2013 3.5  3.5 - 5.2 g/dL Final  . Calcium 07/28/2013 8.5  8.4 - 10.5 mg/dL Final  . T3, Free 07/28/2013 2.6  2.3 - 4.2 pg/mL Final  Orders Only on 07/28/2013  Component Date Value Range Status  . TSH 07/28/2013 1.984  0.350 - 4.500 uIU/mL Final      EXAM:  BP 118/74  Pulse 68  Temp(Src) 98.2 F (36.8 C)  Resp 14  Ht 5\' 4"  (1.626 m)  Wt 195 lb 12.8 oz (88.814 kg)  BMI 33.59 kg/m2  SpO2 97%  LMP 08/02/2013  She has a 2.5 cm smooth left-sided thyroid nodule which is slightly tender Right lobe just palpable, no distinct nodule  Assessment/Plan:   1. Weight loss: She is now achieving this on her own without any weight loss medications and can continue her diet and exercise regimen. This has also helped her dyslipidemia 2. Her glucose appears a little higher fasting this month despite her weight loss. We'll try to get her home glucose monitoring started to help identify if blood sugars are consistently high. Given Contour meter to start. Consider metformin if fasting readings are over 110 consistently 3. Her thyroid nodule is stable and thyroid levels again quite good without medications 4. Blood pressure is still normal without medications, continue monitoring 5. Continue vitamin D supplements, and followup level on the next visit   Josua Ferrebee 08/08/2013, 4:25 PM

## 2013-08-10 DIAGNOSIS — L409 Psoriasis, unspecified: Secondary | ICD-10-CM | POA: Insufficient documentation

## 2013-08-10 DIAGNOSIS — E559 Vitamin D deficiency, unspecified: Secondary | ICD-10-CM | POA: Insufficient documentation

## 2013-11-04 ENCOUNTER — Other Ambulatory Visit (INDEPENDENT_AMBULATORY_CARE_PROVIDER_SITE_OTHER): Payer: BC Managed Care – PPO

## 2013-11-04 DIAGNOSIS — R7309 Other abnormal glucose: Secondary | ICD-10-CM

## 2013-11-04 DIAGNOSIS — E041 Nontoxic single thyroid nodule: Secondary | ICD-10-CM

## 2013-11-04 DIAGNOSIS — R7303 Prediabetes: Secondary | ICD-10-CM

## 2013-11-04 DIAGNOSIS — E559 Vitamin D deficiency, unspecified: Secondary | ICD-10-CM

## 2013-11-04 LAB — COMPREHENSIVE METABOLIC PANEL
ALT: 15 U/L (ref 0–35)
AST: 17 U/L (ref 0–37)
Albumin: 3.9 g/dL (ref 3.5–5.2)
Alkaline Phosphatase: 58 U/L (ref 39–117)
BUN: 8 mg/dL (ref 6–23)
CO2: 25 mEq/L (ref 19–32)
Calcium: 9 mg/dL (ref 8.4–10.5)
Chloride: 106 mEq/L (ref 96–112)
Creatinine, Ser: 0.5 mg/dL (ref 0.4–1.2)
GFR: 188.98 mL/min (ref >60.00)
Glucose, Bld: 92 mg/dL (ref 70–99)
Potassium: 3.7 mEq/L (ref 3.5–5.1)
Sodium: 137 mEq/L (ref 135–145)
Total Bilirubin: 0.4 mg/dL (ref 0.3–1.2)
Total Protein: 7.2 g/dL (ref 6.0–8.3)

## 2013-11-04 LAB — TSH: TSH: 0.63 u[IU]/mL (ref 0.35–5.50)

## 2013-11-04 LAB — HEMOGLOBIN A1C: Hgb A1c MFr Bld: 5.2 % (ref 4.6–6.5)

## 2013-11-05 LAB — VITAMIN D 25 HYDROXY (VIT D DEFICIENCY, FRACTURES): Vit D, 25-Hydroxy: 33 ng/mL (ref 30–89)

## 2013-11-07 ENCOUNTER — Encounter: Payer: Self-pay | Admitting: Endocrinology

## 2013-11-07 ENCOUNTER — Ambulatory Visit (INDEPENDENT_AMBULATORY_CARE_PROVIDER_SITE_OTHER): Payer: BC Managed Care – PPO | Admitting: Endocrinology

## 2013-11-07 VITALS — BP 120/78 | HR 62 | Temp 97.9°F | Wt 192.6 lb

## 2013-11-07 DIAGNOSIS — E786 Lipoprotein deficiency: Secondary | ICD-10-CM

## 2013-11-07 DIAGNOSIS — E041 Nontoxic single thyroid nodule: Secondary | ICD-10-CM

## 2013-11-07 DIAGNOSIS — E785 Hyperlipidemia, unspecified: Secondary | ICD-10-CM

## 2013-11-07 DIAGNOSIS — R7301 Impaired fasting glucose: Secondary | ICD-10-CM

## 2013-11-07 DIAGNOSIS — E669 Obesity, unspecified: Secondary | ICD-10-CM

## 2013-11-07 NOTE — Progress Notes (Signed)
Patient ID: Natasha Lyons, female   DOB: 11/29/1960, 53 y.o.   MRN: [id number]   History of Present Illness:  She is here for periodic followup:  1. OBESITY: Because of failure of diet and exercise to help her lose weight she was on Qsymia in 2014 with excellent results and significant weight loss. Her maximum weight has been about 258 in the past. She stopped Qsymia in 10/14 and has been able to lose more weight with lifestyle changes only. She has been consistently active with walking and toning exercises despite her joint pains. Has good portion control and has very little craving for sweets and carbohydrates. She is concerned that her weight loss has slowed down.  Wt Readings from Last 3 Encounters:  11/07/13 192 lb 9.6 oz (87.363 kg)  08/08/13 195 lb 12.8 oz (88.814 kg)  05/02/13 203 lb 9.6 oz (92.352 kg)    2.  Prediabetes: She had been on metformin previously since 2009 with mostly impaired fasting glucose and normal A1c but she has stopped taking this on her own. Recent fasting glucose 92, previously 112 and highest reading in the past 115. A1c is quite normal at 5.2 now. She is checking periodically at home and her readings are about 100-108 fasting but slightly lower after meals. 3.  Thyroid nodule: On the routine exam with a new primary care physician  was found to have mostly cystic thyroid nodule on the left which was benign on biopsy.  Also apparently had hypothyroidism in the past but her thyroid levels are quite normal with not taking any supplement  4. Vitamin D deficiency: She is not taking OTC vitamin D3 2000 units qd and her level is normal at 33       Medication List       This list is accurate as of: 11/07/13  3:54 PM.  Always use your most recent med list.               cetirizine 10 MG tablet  Commonly known as:  ZYRTEC  Take 10 mg by mouth daily.     etanercept 50 MG/ML injection  Commonly known as:  ENBREL  Inject 50 mg into the skin once a week.      Fish Oil 1000 MG Caps  Take by mouth.     glucose blood test strip  Commonly known as:  BAYER CONTOUR NEXT TEST  Use as instructed to check blood sugars once a day     mometasone 220 MCG/INH inhaler  Commonly known as:  ASMANEX  Inhale 2 puffs into the lungs daily.     PROVENTIL HFA 108 (90 BASE) MCG/ACT inhaler  Generic drug:  albuterol  Inhale 2 puffs into the lungs every 6 (six) hours as needed for wheezing.     Vitamin D (Ergocalciferol) 50000 UNITS Caps capsule  Commonly known as:  DRISDOL  Take 50,000 Units by mouth.        Allergies: No Known Allergies  Past Medical History  Diagnosis Date  . Asthma     No past surgical history on file.  Family History  Problem Relation Age of Onset  . Diabetes Mother   . Hypertension Mother     Social History:  reports that she has never smoked. She has never used smokeless tobacco. Her alcohol and drug histories are not on file.  Review of Systems    History of hypertension: She had been on treatment for the last few years and has been able  to get off medications completely with her weight loss.   Has had history of severe psoriasis, is on treatment with Enbrel  Had history of low HDL: This improved with her weight loss  Lab Results  Component Value Date   CHOL 149 07/28/2013   HDL 42 07/28/2013   LDLCALC 96 07/28/2013   TRIG 56 07/28/2013   CHOLHDL 3.5 07/28/2013     LABS  Appointment on 11/04/2013  Component Date Value Ref Range Status  . Hemoglobin A1C 11/04/2013 5.2  4.6 - 6.5 % Final   Glycemic Control Guidelines for People with Diabetes:Non Diabetic:  <6%Goal of Therapy: <7%Additional Action Suggested:  >8%   . Sodium 11/04/2013 137  135 - 145 mEq/L Final  . Potassium 11/04/2013 3.7  3.5 - 5.1 mEq/L Final  . Chloride 11/04/2013 106  96 - 112 mEq/L Final  . CO2 11/04/2013 25  19 - 32 mEq/L Final  . Glucose, Bld 11/04/2013 92  70 - 99 mg/dL Final  . BUN 11/04/2013 8  6 - 23 mg/dL Final  . Creatinine, Ser  11/04/2013 0.5  0.4 - 1.2 mg/dL Final  . Total Bilirubin 11/04/2013 0.4  0.3 - 1.2 mg/dL Final  . Alkaline Phosphatase 11/04/2013 58  39 - 117 U/L Final  . AST 11/04/2013 17  0 - 37 U/L Final  . ALT 11/04/2013 15  0 - 35 U/L Final  . Total Protein 11/04/2013 7.2  6.0 - 8.3 g/dL Final  . Albumin 11/04/2013 3.9  3.5 - 5.2 g/dL Final  . Calcium 11/04/2013 9.0  8.4 - 10.5 mg/dL Final  . GFR 11/04/2013 188.98  >60.00 mL/min Final  . Vit D, 25-Hydroxy 11/04/2013 33  30 - 89 ng/mL Final   Comment: This assay accurately quantifies Vitamin D, which is the sum of the                          25-Hydroxy forms of Vitamin D2 and D3.  Studies have shown that the                          optimum concentration of 25-Hydroxy Vitamin D is 30 ng/mL or higher.                           Concentrations of Vitamin D between 20 and 29 ng/mL are considered to                          be insufficient and concentrations less than 20 ng/mL are considered                          to be deficient for Vitamin D.  . TSH 11/04/2013 0.63  0.35 - 5.50 uIU/mL Final     EXAM:  BP 120/78  Pulse 62  Temp(Src) 97.9 F (36.6 C) (Oral)  Wt 192 lb 9.6 oz (87.363 kg)  SpO2 96%  LMP 09/26/2013    Right lobe enlargement about 1-1/2-2 times palpable, firm, no distinct nodule. No nodule palpable on the left side which is minimally enlarged  Assessment/Plan:   1. Weight loss: She is now maintaining her weight loss without any weight loss medications. Although she wants to lose weight more rapidly explained to her that this may be more difficult to achieve; she has had significant metabolic  benefit from losing over 60 pounds. She will continue her diet and exercise regimen.  2. Her glucose is quite normal fasting this month along with excellent A1c. Also blood sugars are relatively good at home although maybe just over 100 on waking up. She can check her blood glucose twice a week 3. Her thyroid nodule is nonpalpable on the left  side but she does appear to have small goiter on the right. Thyroid functions are still normal without any supplement. Will follow her goiter clinically 4. Blood pressure is still normal without medications 5. Continue vitamin D supplements unchanged   Natasha Lyons Snare 11/07/2013, 3:54 PM

## 2014-02-04 ENCOUNTER — Telehealth: Payer: Self-pay | Admitting: Endocrinology

## 2014-02-04 NOTE — Telephone Encounter (Signed)
Pt would like a call back after 4 pm if possible--no reason given

## 2014-02-04 NOTE — Telephone Encounter (Signed)
Has nothing to do with cortisol. We can see her and check her thyroid. May need to consider Qsymia again

## 2014-02-04 NOTE — Telephone Encounter (Signed)
Your first available time she can make it is August 24th, but that's the first day of school, she's coming in for labs on 8/4, what labs do you want ordered?

## 2014-02-04 NOTE — Telephone Encounter (Signed)
Patient has been working very hard on losing weight, but she says lately she has started to gain again and she has no idea why, she wants to know if you would do hormone testing or check her cortisol level before school starts again.  Please advise

## 2014-02-05 ENCOUNTER — Other Ambulatory Visit: Payer: Self-pay | Admitting: *Deleted

## 2014-02-05 DIAGNOSIS — E041 Nontoxic single thyroid nodule: Secondary | ICD-10-CM

## 2014-02-05 NOTE — Telephone Encounter (Signed)
Free T4, TSH. Let her know that we need to do cortisol only if exam is abnormal

## 2014-02-06 NOTE — Telephone Encounter (Signed)
Patient asked if you would call her sometime after 4:00.  Thank you

## 2014-02-10 ENCOUNTER — Other Ambulatory Visit: Payer: BC Managed Care – PPO

## 2014-02-12 NOTE — Telephone Encounter (Signed)
Pl let her know 

## 2014-02-12 NOTE — Telephone Encounter (Signed)
She has already been told.

## 2014-03-23 ENCOUNTER — Other Ambulatory Visit (HOSPITAL_COMMUNITY)
Admission: RE | Admit: 2014-03-23 | Discharge: 2014-03-23 | Disposition: A | Discharge status: Home / Self Care | Payer: BC Managed Care – PPO | Source: Ambulatory Visit | Attending: Internal Medicine | Admitting: Internal Medicine

## 2014-03-23 ENCOUNTER — Other Ambulatory Visit: Payer: Self-pay | Admitting: Internal Medicine

## 2014-03-23 DIAGNOSIS — Z1151 Encounter for screening for human papillomavirus (HPV): Secondary | ICD-10-CM | POA: Insufficient documentation

## 2014-03-23 DIAGNOSIS — Z01419 Encounter for gynecological examination (general) (routine) without abnormal findings: Secondary | ICD-10-CM | POA: Diagnosis present

## 2014-03-26 LAB — CYTOLOGY - PAP

## 2014-04-06 ENCOUNTER — Other Ambulatory Visit (INDEPENDENT_AMBULATORY_CARE_PROVIDER_SITE_OTHER): Payer: Self-pay | Admitting: Surgery

## 2014-04-15 ENCOUNTER — Encounter (HOSPITAL_BASED_OUTPATIENT_CLINIC_OR_DEPARTMENT_OTHER): Payer: Self-pay | Admitting: *Deleted

## 2014-04-15 NOTE — Progress Notes (Signed)
No labs needed

## 2014-04-20 NOTE — H&P (Signed)
Natasha Lyons 04/06/2014 4:27 PM Location: [facility] Patient #: [id number] DOB: [birth year] Married / Language: English / Race: Black or African American Female  History of Present Illness (Shemia Bevel A. Ninfa Linden MD; 04/06/2014 4:42 PM) Patient words: rt upper back subcuateneous lesions.  The patient is a 53 year old female who presents with a complaint of Mass. She is referred to me by [facility] for evaluation of a mass on her left back. It has been there for some time but is now becoming much larger and causing her to have significant discomfort. It hurts with contact as well and keeps her up at night. She has no other complaints.   Other Problems Ivor Costa, [place]; 04/06/2014 4:28 PM) Arthritis Asthma High blood pressure Thyroid Disease  Past Surgical History Ivor [place], [place]; 04/06/2014 4:28 PM) Appendectomy Gallbladder Surgery - Laparoscopic Knee Surgery Bilateral. Oral Surgery Tonsillectomy  Diagnostic Studies History Ivor [place], [place]; 04/06/2014 4:28 PM) Colonoscopy never Mammogram 1-3 years ago Pap Smear 1-5 years ago  Allergies Ivor [place], [place]; 04/06/2014 4:29 PM) No Known Drug Allergies09/28/2015  Medication History Ivor [place], [place]; 04/06/2014 4:31 PM) Albuterol (90MCG/ACT Aerosol Soln, Inhalation as needed) Active. Enbrel SureClick (50MG /ML Soln Auto-inj, Subcutaneous) Active. Vitamin D (Ergocalciferol) (50000UNIT Capsule, Oral) Active. Fish Oil (1000MG  Capsule, Oral) Active.  Social History Alyse Low [place], [place]; 04/06/2014 4:28 PM) Alcohol use Occasional alcohol use. Caffeine use Coffee, Tea. No drug use Tobacco use Never smoker.  Family History Alyse Low [place], [place]; 04/06/2014 4:28 PM) Arthritis Father, Mother. Cerebrovascular Accident Mother. Colon Polyps Mother. Diabetes Mellitus Mother. Hypertension Father, Mother. Respiratory Condition Father. Thyroid problems Mother.  Pregnancy / Birth History Ivor [place], [place]; 04/06/2014 4:28 PM) Age at menarche 52 years. Contraceptive History Oral contraceptives. Gravida 0 Irregular periods Para 0  Review of Systems Alyse Low [place]; 04/06/2014 4:28 PM) General Present- Fatigue. Not Present- Appetite Loss, Chills, Fever, Night Sweats, Weight Gain and Weight Loss. Skin Not Present- Change in Wart/Mole, Dryness, Hives, Jaundice, New Lesions, Non-Healing Wounds, Rash and Ulcer. HEENT Present- Seasonal Allergies. Not Present- Earache, Hearing Loss, Hoarseness, Nose Bleed, Oral Ulcers, Ringing in the Ears, Sinus Pain, Sore Throat, Visual Disturbances, Wears glasses/contact lenses and Yellow Eyes. Respiratory Present- Snoring. Not Present- Bloody sputum, Chronic Cough, Difficulty Breathing and Wheezing. Breast Not Present- Breast Mass, Breast Pain, Nipple Discharge and Skin Changes. Cardiovascular Not Present- Chest Pain, Difficulty Breathing Lying Down, Leg Cramps, Palpitations, Rapid Heart Rate, Shortness of Breath and Swelling of Extremities. Gastrointestinal Not Present- Abdominal Pain, Bloating, Bloody Stool, Change in Bowel Habits, Chronic diarrhea, Constipation, Difficulty Swallowing, Excessive gas, Gets full quickly at meals, Hemorrhoids, Indigestion, Nausea, Rectal Pain and Vomiting. Female Genitourinary Not Present- Frequency, Nocturia, Painful Urination, Pelvic Pain and Urgency. Musculoskeletal Present- Back Pain, Joint Pain, Joint Stiffness, Muscle Pain and Swelling of Extremities. Not Present- Muscle Weakness. Neurological Not Present- Decreased Memory, Fainting, Headaches, Numbness, Seizures, Tingling, Tremor, Trouble walking and Weakness. Psychiatric Not Present- Anxiety, Bipolar, Change in Sleep Pattern, Depression, Fearful and Frequent crying. Endocrine Present- Cold Intolerance and Hot flashes. Not Present- Excessive Hunger, Hair Changes, Heat Intolerance and New Diabetes. Hematology Not Present- Easy Bruising, Excessive bleeding, Gland  problems, HIV and Persistent Infections.   Vitals Ivor Costa MA; 04/06/2014 4:28 PM) 04/06/2014 4:28 PM Weight: 206.4 lb Height: 63.5in Body Surface Area: 2.05 m Body Mass Index: 35.99 kg/m Temp.: 97.10F(Temporal)  Pulse: 72 (Regular)  BP: 118/90 (Sitting, Left Arm, Standard)    Physical Exam (Winta Barcelo A. Ninfa Linden MD; 04/06/2014 4:43 PM) General Note: well-developed, well-nourished  in no distress   Integumentary Note: there is a large, 6 cm mass on the left back at the lower edge of the scapula which is deep, mobile, and tender. It feels well-circumscribed. There are no skin changes   Chest and Lung Exam Note: Clear bilaterally with no respiratory effort   Cardiovascular Note: regular rate and rhythm     Assessment & Plan (Zadyn Yardley A. Ninfa Linden MD; 04/06/2014 4:45 PM) MASS ON BACK (782.2  R22.2) Impression: because of her symptoms, excision of the mass is recommended. This is also recommended for histologic evaluation to rule out malignancy given the size. I discussed this with the patient in detail. I discussed the risk of surgery which includes but is not limited to bleeding, infection, seroma formation, recurrence, need for fuether surgery if malignancy is present, et Ronney Asters. I also discussed postoperative recovery. She understands and wishes to proceed.

## 2014-04-21 ENCOUNTER — Ambulatory Visit (HOSPITAL_BASED_OUTPATIENT_CLINIC_OR_DEPARTMENT_OTHER): Payer: BC Managed Care – PPO | Admitting: Anesthesiology

## 2014-04-21 ENCOUNTER — Ambulatory Visit (HOSPITAL_BASED_OUTPATIENT_CLINIC_OR_DEPARTMENT_OTHER)
Admission: RE | Admit: 2014-04-21 | Discharge: 2014-04-21 | Disposition: A | Discharge status: Home / Self Care | Payer: BC Managed Care – PPO | Source: Ambulatory Visit | Attending: Surgery | Admitting: Surgery

## 2014-04-21 ENCOUNTER — Encounter (HOSPITAL_BASED_OUTPATIENT_CLINIC_OR_DEPARTMENT_OTHER)
Admission: RE | Disposition: A | Discharge status: Home / Self Care | Payer: Self-pay | Source: Ambulatory Visit | Attending: Surgery

## 2014-04-21 ENCOUNTER — Encounter (HOSPITAL_BASED_OUTPATIENT_CLINIC_OR_DEPARTMENT_OTHER): Payer: Self-pay | Admitting: *Deleted

## 2014-04-21 ENCOUNTER — Encounter (HOSPITAL_BASED_OUTPATIENT_CLINIC_OR_DEPARTMENT_OTHER): Payer: BC Managed Care – PPO | Admitting: Anesthesiology

## 2014-04-21 DIAGNOSIS — Z79899 Other long term (current) drug therapy: Secondary | ICD-10-CM | POA: Diagnosis not present

## 2014-04-21 DIAGNOSIS — J45909 Unspecified asthma, uncomplicated: Secondary | ICD-10-CM | POA: Diagnosis not present

## 2014-04-21 DIAGNOSIS — M1990 Unspecified osteoarthritis, unspecified site: Secondary | ICD-10-CM | POA: Diagnosis not present

## 2014-04-21 DIAGNOSIS — I10 Essential (primary) hypertension: Secondary | ICD-10-CM | POA: Diagnosis not present

## 2014-04-21 DIAGNOSIS — E079 Disorder of thyroid, unspecified: Secondary | ICD-10-CM | POA: Insufficient documentation

## 2014-04-21 DIAGNOSIS — D171 Benign lipomatous neoplasm of skin and subcutaneous tissue of trunk: Secondary | ICD-10-CM | POA: Insufficient documentation

## 2014-04-21 DIAGNOSIS — R222 Localized swelling, mass and lump, trunk: Secondary | ICD-10-CM | POA: Diagnosis present

## 2014-04-21 DIAGNOSIS — E669 Obesity, unspecified: Secondary | ICD-10-CM | POA: Insufficient documentation

## 2014-04-21 HISTORY — PX: MASS EXCISION: SHX2000

## 2014-04-21 LAB — POCT HEMOGLOBIN-HEMACUE: Hemoglobin: 11.9 g/dL — ABNORMAL LOW (ref 12.0–15.0)

## 2014-04-21 SURGERY — EXCISION MASS
Anesthesia: General | Site: Back | Laterality: Right

## 2014-04-21 MED ORDER — ALBUTEROL SULFATE HFA 108 (90 BASE) MCG/ACT IN AERS
INHALATION_SPRAY | RESPIRATORY_TRACT | Status: DC | PRN
  Administered 2014-04-21: 2 via RESPIRATORY_TRACT

## 2014-04-21 MED ORDER — BUPIVACAINE-EPINEPHRINE 0.5% -1:200000 IJ SOLN
INTRAMUSCULAR | Status: DC | PRN
  Administered 2014-04-21: 20 mL

## 2014-04-21 MED ORDER — FENTANYL CITRATE 0.05 MG/ML IJ SOLN
INTRAMUSCULAR | Status: AC
  Filled 2014-04-21: qty 6

## 2014-04-21 MED ORDER — OXYCODONE HCL 5 MG/5ML PO SOLN: 5.0000 mg | Freq: Once | ORAL | Status: DC | PRN

## 2014-04-21 MED ORDER — CEFAZOLIN SODIUM-DEXTROSE 2-3 GM-% IV SOLR
INTRAVENOUS | Status: DC | PRN
  Administered 2014-04-21: 2 g via INTRAVENOUS

## 2014-04-21 MED ORDER — PROPOFOL 10 MG/ML IV BOLUS
INTRAVENOUS | Status: AC
  Filled 2014-04-21: qty 20

## 2014-04-21 MED ORDER — FENTANYL CITRATE 0.05 MG/ML IJ SOLN
INTRAMUSCULAR | Status: AC
  Filled 2014-04-21: qty 2

## 2014-04-21 MED ORDER — SODIUM CHLORIDE 0.9 % IJ SOLN
3.0000 mL | Freq: Two times a day (BID) | INTRAMUSCULAR | Status: DC
Start: 2014-04-21 — End: 2014-04-21

## 2014-04-21 MED ORDER — SODIUM CHLORIDE 0.9 % IV SOLN: 250.0000 mL | INTRAVENOUS | Status: DC | PRN

## 2014-04-21 MED ORDER — KETOROLAC TROMETHAMINE 30 MG/ML IJ SOLN
INTRAMUSCULAR | Status: DC | PRN
  Administered 2014-04-21: 30 mg via INTRAVENOUS

## 2014-04-21 MED ORDER — MIDAZOLAM HCL 2 MG/2ML IJ SOLN: 1.0000 mg | INTRAMUSCULAR | Status: DC | PRN

## 2014-04-21 MED ORDER — FENTANYL CITRATE 0.05 MG/ML IJ SOLN
25.0000 ug | INTRAMUSCULAR | Status: DC | PRN
  Administered 2014-04-21: 50 ug via INTRAVENOUS
  Administered 2014-04-21: 25 ug via INTRAVENOUS

## 2014-04-21 MED ORDER — ACETAMINOPHEN 325 MG PO TABS: 650.0000 mg | ORAL_TABLET | ORAL | Status: DC | PRN

## 2014-04-21 MED ORDER — MORPHINE SULFATE 2 MG/ML IJ SOLN: 1.0000 mg | INTRAMUSCULAR | Status: DC | PRN

## 2014-04-21 MED ORDER — OXYCODONE-ACETAMINOPHEN 5-325 MG PO TABS: 1.0000 | ORAL_TABLET | ORAL | Status: DC | PRN

## 2014-04-21 MED ORDER — CEFAZOLIN SODIUM-DEXTROSE 2-3 GM-% IV SOLR
INTRAVENOUS | Status: AC
  Filled 2014-04-21: qty 50

## 2014-04-21 MED ORDER — OXYCODONE HCL 5 MG PO TABS: 5.0000 mg | ORAL_TABLET | ORAL | Status: DC | PRN

## 2014-04-21 MED ORDER — BUPIVACAINE-EPINEPHRINE (PF) 0.5% -1:200000 IJ SOLN
INTRAMUSCULAR | Status: AC
  Filled 2014-04-21: qty 30

## 2014-04-21 MED ORDER — ACETAMINOPHEN 650 MG RE SUPP: 650.0000 mg | RECTAL | Status: DC | PRN

## 2014-04-21 MED ORDER — LIDOCAINE HCL (PF) 1 % IJ SOLN
INTRAMUSCULAR | Status: AC
  Filled 2014-04-21: qty 30

## 2014-04-21 MED ORDER — ONDANSETRON HCL 4 MG/2ML IJ SOLN
INTRAMUSCULAR | Status: DC | PRN
  Administered 2014-04-21: 4 mg via INTRAVENOUS

## 2014-04-21 MED ORDER — LACTATED RINGERS IV SOLN
INTRAVENOUS | Status: DC
  Administered 2014-04-21 (×2): via INTRAVENOUS

## 2014-04-21 MED ORDER — FENTANYL CITRATE 0.05 MG/ML IJ SOLN: 50.0000 ug | INTRAMUSCULAR | Status: DC | PRN

## 2014-04-21 MED ORDER — SODIUM BICARBONATE 4 % IV SOLN
INTRAVENOUS | Status: AC
  Filled 2014-04-21: qty 5

## 2014-04-21 MED ORDER — SODIUM CHLORIDE 0.9 % IJ SOLN: 3.0000 mL | INTRAMUSCULAR | Status: DC | PRN

## 2014-04-21 MED ORDER — SUCCINYLCHOLINE CHLORIDE 20 MG/ML IJ SOLN
INTRAMUSCULAR | Status: DC | PRN
  Administered 2014-04-21: 50 mg via INTRAVENOUS

## 2014-04-21 MED ORDER — MIDAZOLAM HCL 2 MG/2ML IJ SOLN
INTRAMUSCULAR | Status: AC
  Filled 2014-04-21: qty 2

## 2014-04-21 MED ORDER — MIDAZOLAM HCL 5 MG/5ML IJ SOLN
INTRAMUSCULAR | Status: DC | PRN
  Administered 2014-04-21: 2 mg via INTRAVENOUS

## 2014-04-21 MED ORDER — PROPOFOL 10 MG/ML IV BOLUS
INTRAVENOUS | Status: DC | PRN
  Administered 2014-04-21: 200 mg via INTRAVENOUS

## 2014-04-21 MED ORDER — ONDANSETRON HCL 4 MG/2ML IJ SOLN: 4.0000 mg | Freq: Four times a day (QID) | INTRAMUSCULAR | Status: DC | PRN

## 2014-04-21 MED ORDER — CEFAZOLIN SODIUM-DEXTROSE 2-3 GM-% IV SOLR: 2.0000 g | INTRAVENOUS | Status: DC

## 2014-04-21 MED ORDER — OXYCODONE HCL 5 MG PO TABS: 5.0000 mg | ORAL_TABLET | Freq: Once | ORAL | Status: DC | PRN

## 2014-04-21 MED ORDER — LIDOCAINE HCL (CARDIAC) 10 MG/ML IV SOLN
INTRAVENOUS | Status: DC | PRN
  Administered 2014-04-21: 100 mg via INTRAVENOUS

## 2014-04-21 MED ORDER — DEXAMETHASONE SODIUM PHOSPHATE 4 MG/ML IJ SOLN
INTRAMUSCULAR | Status: DC | PRN
  Administered 2014-04-21: 10 mg via INTRAVENOUS

## 2014-04-21 MED ORDER — FENTANYL CITRATE 0.05 MG/ML IJ SOLN
INTRAMUSCULAR | Status: DC | PRN
  Administered 2014-04-21 (×2): 50 ug via INTRAVENOUS

## 2014-04-21 SURGICAL SUPPLY — 45 items
BENZOIN TINCTURE PRP APPL 2/3 (GAUZE/BANDAGES/DRESSINGS) ×2 IMPLANT
BLADE CLIPPER SURG (BLADE) IMPLANT
BLADE HEX COATED 2.75 (ELECTRODE) ×2 IMPLANT
BLADE SURG 15 STRL LF DISP TIS (BLADE) ×1 IMPLANT
BLADE SURG 15 STRL SS (BLADE) ×1
CANISTER SUCT 1200ML W/VALVE (MISCELLANEOUS) ×2 IMPLANT
CHLORAPREP W/TINT 26ML (MISCELLANEOUS) ×2 IMPLANT
COVER BACK TABLE 60X90IN (DRAPES) ×2 IMPLANT
COVER MAYO STAND STRL (DRAPES) ×2 IMPLANT
DECANTER SPIKE VIAL GLASS SM (MISCELLANEOUS) IMPLANT
DRAPE PED LAPAROTOMY (DRAPES) ×2 IMPLANT
DRAPE UTILITY XL STRL (DRAPES) ×2 IMPLANT
DRSG TEGADERM 4X4.75 (GAUZE/BANDAGES/DRESSINGS) ×2 IMPLANT
ELECT REM PT RETURN 9FT ADLT (ELECTROSURGICAL) ×2
ELECTRODE REM PT RTRN 9FT ADLT (ELECTROSURGICAL) ×1 IMPLANT
GLOVE BIOGEL PI IND STRL 7.0 (GLOVE) ×1 IMPLANT
GLOVE BIOGEL PI INDICATOR 7.0 (GLOVE) ×1
GLOVE ECLIPSE 6.5 STRL STRAW (GLOVE) ×2 IMPLANT
GLOVE EXAM NITRILE EXT CUFF MD (GLOVE) ×2 IMPLANT
GLOVE SURG SIGNA 7.5 PF LTX (GLOVE) ×2 IMPLANT
GOWN STRL REUS W/ TWL LRG LVL3 (GOWN DISPOSABLE) ×1 IMPLANT
GOWN STRL REUS W/ TWL XL LVL3 (GOWN DISPOSABLE) ×1 IMPLANT
GOWN STRL REUS W/TWL LRG LVL3 (GOWN DISPOSABLE) ×1
GOWN STRL REUS W/TWL XL LVL3 (GOWN DISPOSABLE) ×1
NEEDLE HYPO 25X1 1.5 SAFETY (NEEDLE) ×2 IMPLANT
NS IRRIG 1000ML POUR BTL (IV SOLUTION) ×2 IMPLANT
PACK BASIN DAY SURGERY FS (CUSTOM PROCEDURE TRAY) ×2 IMPLANT
PENCIL BUTTON HOLSTER BLD 10FT (ELECTRODE) ×2 IMPLANT
SLEEVE SCD COMPRESS KNEE MED (MISCELLANEOUS) ×2 IMPLANT
SPONGE GAUZE 4X4 12PLY STER LF (GAUZE/BANDAGES/DRESSINGS) ×2 IMPLANT
SPONGE LAP 4X18 X RAY DECT (DISPOSABLE) IMPLANT
STRIP CLOSURE SKIN 1/2X4 (GAUZE/BANDAGES/DRESSINGS) ×2 IMPLANT
SUT MNCRL AB 4-0 PS2 18 (SUTURE) ×2 IMPLANT
SUT PROLENE 3 0 PS 2 (SUTURE) IMPLANT
SUT VIC AB 2-0 SH 27 (SUTURE) ×1
SUT VIC AB 2-0 SH 27XBRD (SUTURE) ×1 IMPLANT
SUT VIC AB 3-0 SH 27 (SUTURE) ×1
SUT VIC AB 3-0 SH 27X BRD (SUTURE) ×1 IMPLANT
SYR BULB 3OZ (MISCELLANEOUS) ×2 IMPLANT
SYR CONTROL 10ML LL (SYRINGE) ×2 IMPLANT
TOWEL OR 17X24 6PK STRL BLUE (TOWEL DISPOSABLE) ×2 IMPLANT
TOWEL OR NON WOVEN STRL DISP B (DISPOSABLE) IMPLANT
TRAY DSU PREP LF (CUSTOM PROCEDURE TRAY) IMPLANT
TUBE CONNECTING 20X1/4 (TUBING) ×2 IMPLANT
YANKAUER SUCT BULB TIP NO VENT (SUCTIONS) ×2 IMPLANT

## 2014-04-21 NOTE — Anesthesia Postprocedure Evaluation (Signed)
Anesthesia Post Note  Patient: Natasha Lyons  Procedure(s) Performed: Procedure(s) (LRB): EXCISION OF RIGHT BACK MASS (Right)  Anesthesia type: General  Patient location: PACU  Post pain: Pain level controlled and Adequate analgesia  Post assessment: Post-op Vital signs reviewed, Patient's Cardiovascular Status Stable, Respiratory Function Stable, Patent Airway and Pain level controlled  Last Vitals:  Filed Vitals:   04/21/14 1545  BP: 128/67  Pulse: 61  Temp:   Resp: 15    Post vital signs: Reviewed and stable  Level of consciousness: awake, alert  and oriented  Complications: No apparent anesthesia complications

## 2014-04-21 NOTE — Transfer of Care (Signed)
Immediate Anesthesia Transfer of Care Note  Patient: Natasha Lyons  Procedure(s) Performed: Procedure(s): EXCISION OF RIGHT BACK MASS (Right)  Patient Location: PACU  Anesthesia Type:General  Level of Consciousness: awake, alert  and patient cooperative  Airway & Oxygen Therapy: Patient Spontanous Breathing and Patient connected to face mask oxygen  Post-op Assessment: Report given to PACU RN and Post -op Vital signs reviewed and stable  Post vital signs: Reviewed and stable  Complications: No apparent anesthesia complications

## 2014-04-21 NOTE — Anesthesia Procedure Notes (Signed)
Procedure Name: Intubation Date/Time: 04/21/2014 2:13 PM Performed by: Marrianne Mood Pre-anesthesia Checklist: Patient identified, Emergency Drugs available, Suction available, Patient being monitored and Timeout performed Patient Re-evaluated:Patient Re-evaluated prior to inductionOxygen Delivery Method: Circle System Utilized Preoxygenation: Pre-oxygenation with 100% oxygen Intubation Type: IV induction Ventilation: Mask ventilation without difficulty Laryngoscope Size: Miller and 3 Grade View: Grade II Tube type: Oral Tube size: 7.0 mm Number of attempts: 1 Airway Equipment and Method: stylet and oral airway Placement Confirmation: ETT inserted through vocal cords under direct vision,  positive ETCO2 and breath sounds checked- equal and bilateral Secured at: 20 cm Tube secured with: Tape Dental Injury: Teeth and Oropharynx as per pre-operative assessment

## 2014-04-21 NOTE — Op Note (Signed)
EXCISION OF RIGHT BACK MASS  Procedure Note  Natasha Lyons 04/21/2014   Pre-op Diagnosis: Back Mass     Post-op Diagnosis: same  Procedure(s): EXCISION OF RIGHT BACK MASS  Surgeon(s): Coralie Keens, MD  Anesthesia: General  Staff:  Circulator: Billey Co, RN Scrub Person: Eston Esters, RN  Estimated Blood Loss: Minimal               Specimens:   12cm x 6cm mass, ?lipoma          [name]   Date: 04/21/2014  Time: 2:49 PM

## 2014-04-21 NOTE — Interval H&P Note (Signed)
History and Physical Interval Note: no change in h and p  04/21/2014 12:48 PM  Natasha Lyons  has presented today for surgery, with the diagnosis of Back Mass  The various methods of treatment have been discussed with the patient and family. After consideration of risks, benefits and other options for treatment, the patient has consented to  Procedure(s): EXCISION OF LEFT BACK MASS (Left) as a surgical intervention .  The patient's history has been reviewed, patient examined, no change in status, stable for surgery.  I have reviewed the patient's chart and labs.  Questions were answered to the patient's satisfaction.     Nkosi Cortright A

## 2014-04-21 NOTE — Interval H&P Note (Signed)
History and Physical Interval Note: H and P says left back.  The mass is actually on the right back and was mislabeled. Confirmed with patient to be right back mass which will be excised.  04/21/2014 2:11 PM  Amuthanjalee Lawson Fiscal  has presented today for surgery, with the diagnosis of Back Mass  The various methods of treatment have been discussed with the patient and family. After consideration of risks, benefits and other options for treatment, the patient has consented to  Procedure(s): EXCISION OF RIGHT BACK MASS (Right) as a surgical intervention .  The patient's history has been reviewed, patient examined, no change in status, stable for surgery.  I have reviewed the patient's chart and labs.  Questions were answered to the patient's satisfaction.     Carlisha Wisler A

## 2014-04-21 NOTE — Discharge Instructions (Signed)
Ok to remove bandage and shower tomorrow  Ice pack also for pain  Leave steri-strips on incision at least one week  Post Anesthesia Home Care Instructions  Activity: Get plenty of rest for the remainder of the day. A responsible adult should stay with you for 24 hours following the procedure.  For the next 24 hours, DO NOT: -Drive a car -Paediatric nurse -Drink alcoholic beverages -Take any medication unless instructed by your physician -Make any legal decisions or sign important papers.  Meals: Start with liquid foods such as gelatin or soup. Progress to regular foods as tolerated. Avoid greasy, spicy, heavy foods. If nausea and/or vomiting occur, drink only clear liquids until the nausea and/or vomiting subsides. Call your physician if vomiting continues.  Special Instructions/Symptoms: Your throat may feel dry or sore from the anesthesia or the breathing tube placed in your throat during surgery. If this causes discomfort, gargle with warm salt water. The discomfort should disappear within 24 hours.

## 2014-04-21 NOTE — Anesthesia Preprocedure Evaluation (Addendum)
Anesthesia Evaluation  Patient identified by MRN, date of birth, ID band Patient awake    Reviewed: Allergy & Precautions, H&P , NPO status , Patient's Chart, lab work & pertinent test results  Airway Mallampati: II  Neck ROM: full    Dental   Pulmonary asthma ,          Cardiovascular negative cardio ROS      Neuro/Psych    GI/Hepatic   Endo/Other  obese  Renal/GU      Musculoskeletal   Abdominal   Peds  Hematology   Anesthesia Other Findings   Reproductive/Obstetrics                           Anesthesia Physical Anesthesia Plan  ASA: II  Anesthesia Plan: General   Post-op Pain Management:    Induction: Intravenous  Airway Management Planned: Oral ETT  Additional Equipment:   Intra-op Plan:   Post-operative Plan: Extubation in OR  Informed Consent: I have reviewed the patients History and Physical, chart, labs and discussed the procedure including the risks, benefits and alternatives for the proposed anesthesia with the patient or authorized representative who has indicated his/her understanding and acceptance.     Plan Discussed with: CRNA, Anesthesiologist and Surgeon  Anesthesia Plan Comments:        Anesthesia Quick Evaluation

## 2014-04-22 ENCOUNTER — Encounter (HOSPITAL_BASED_OUTPATIENT_CLINIC_OR_DEPARTMENT_OTHER): Payer: Self-pay | Admitting: Surgery

## 2014-04-22 NOTE — Op Note (Signed)
NAMEOMEKA, HOLBEN NO.:  [id number]  MEDICAL RECORD NO.:  [id number]  LOCATION:                               FACILITY:  [facility]  PHYSICIAN:  Coralie Keens, M.D. DATE OF BIRTH:  07-13-1960  DATE OF PROCEDURE:  04/21/2014 DATE OF DISCHARGE:  04/21/2014                              OPERATIVE REPORT   PREOPERATIVE DIAGNOSIS:  Right back mass.  POSTOPERATIVE DIAGNOSIS:  Right back mass.  PROCEDURE:  Excision of 12-cm right back mass.  SURGEON:  Coralie Keens, MD  ANESTHESIA:  General and 0.5% Marcaine.  ESTIMATED BLOOD LOSS:  Minimal.  FINDINGS:  The patient was found to have a mass below the muscle of the right back.  It appeared consistent with a lipoma.  It measured 12 cm x 6 cm in size.  It was sent to Pathology for evaluation.  PROCEDURE IN DETAILS:  The patient was brought to the operating room and identified as the correct patient.  She was placed supine on the operating table and general anesthesia was induced.  The patient was then placed in the left lateral decubitus position.  Her back was then prepped and draped in the usual sterile fashion.  I made a longitudinal incision over the palpable mass with a scalpel.  I took this down through the subcutaneous tissue with electrocautery.  The mass was actually deep to the paraspinous muscles.  I was able to bluntly dissect and retract the muscles and identified the very large mass which appeared consistent with a lipoma.  I was able to finally easily elevated out of the wound and divided from its attachments with the electrocautery.  Once the mass was removed it was measured and found to be 12 cm x 6 cm in size.  I sent the mass to Pathology for evaluation. I then thoroughly irrigated the wound with saline.  Hemostasis was achieved with cautery.  I closed the deep tissue layer over the muscle with interrupted 2-0 Vicryl sutures.  I then closed the subcutaneous tissues with interrupted 3-0 Vicryl  sutures and closed the skin with a running 4-0 Monocryl.  Steri-Strips, gauze, and Tegaderm were then applied.  The patient tolerated the procedure well.  All the counts were correct at the end of procedure.  The patient was then extubated in the operating room and taken in stable condition to recovery room.     Coralie Keens, M.D.    DB/MEDQ  D:  04/21/2014  T:  04/21/2014  Job:  [id number]

## 2014-05-07 ENCOUNTER — Other Ambulatory Visit: Payer: Self-pay | Admitting: Gastroenterology

## 2014-05-11 ENCOUNTER — Ambulatory Visit: Payer: BC Managed Care – PPO | Admitting: Endocrinology

## 2014-05-25 ENCOUNTER — Telehealth: Payer: Self-pay | Admitting: Endocrinology

## 2014-05-25 ENCOUNTER — Encounter: Payer: Self-pay | Admitting: Endocrinology

## 2014-05-25 ENCOUNTER — Ambulatory Visit (INDEPENDENT_AMBULATORY_CARE_PROVIDER_SITE_OTHER): Payer: BC Managed Care – PPO | Admitting: Endocrinology

## 2014-05-25 VITALS — BP 128/84 | HR 68 | Temp 98.0°F | Resp 14 | Ht 64.0 in | Wt 216.0 lb

## 2014-05-25 DIAGNOSIS — R7301 Impaired fasting glucose: Secondary | ICD-10-CM

## 2014-05-25 DIAGNOSIS — E669 Obesity, unspecified: Secondary | ICD-10-CM

## 2014-05-25 DIAGNOSIS — E041 Nontoxic single thyroid nodule: Secondary | ICD-10-CM

## 2014-05-25 MED ORDER — PHENTERMINE-TOPIRAMATE ER 3.75-23 MG PO CP24: 3.7500 mg | ORAL_CAPSULE | Freq: Every day | ORAL | Status: DC

## 2014-05-25 MED ORDER — PHENTERMINE-TOPIRAMATE ER 7.5-46 MG PO CP24: 7.5000 mg | ORAL_CAPSULE | Freq: Every day | ORAL | Status: DC

## 2014-05-25 NOTE — Telephone Encounter (Signed)
Patient stated that pharmacy need a prior authorization of med Qysmia

## 2014-05-25 NOTE — Progress Notes (Signed)
Patient ID: Natasha Lyons, female   DOB: 1960-09-04, 53 y.o.   MRN: [id number]   History of Present Illness:  She is here for periodic followup:  1. OBESITY: Because of failure of diet and exercise to help her lose weight she was on Qsymia in 2014 with excellent results and significant weight loss. Her maximum weight has been about 258 in the past. She stopped Qsymia in 10/14 and initially was able to continue losing weight with lifestyle changes only. She has been trying to do her low carbohydrate diet along with cutting back on portions and fat intake most of the time. However has not been able to exercise at least in the last month because of lipoma surgery. She is concerned that her weight has gone back up significantly even with her attempts to keep it down  Wt Readings from Last 3 Encounters:  05/25/14 216 lb (97.977 kg)  04/21/14 212 lb (96.163 kg)  11/07/13 192 lb 9.6 oz (87.363 kg)     Prediabetes: She had been on metformin previously since 2009 with mostly impaired fasting glucose and normal A1c but she has stopped taking this on her own. Recent fasting glucosenot available but her random glucose with PCP was 93 and no A1c available recently.  The highest fasting glucose reading in the past was 115. A1c was 5.2 previously. She is not checking   3.  Thyroid nodule: On the routine exam with primary care physician  was found to have mostly cystic thyroid nodule on the left which was benign on biopsy.  Also apparently had hypothyroidism in the past but her thyroid levels are quite normal with not taking any supplement. Recent TSH is 2.5  4. Vitamin D deficiency: She is taking OTC vitamin D 50,000 units weekly now since her level has gone down to 21, done by PCP in September 2015       Medication List       This list is accurate as of: 05/25/14  8:10 AM.  Always use your most recent med list.               cetirizine 10 MG tablet  Commonly known as:  ZYRTEC  Take 10 mg  by mouth daily.     etanercept 50 MG/ML injection  Commonly known as:  ENBREL  Inject 50 mg into the skin once a week.     Fish Oil 1000 MG Caps  Take by mouth.     glucose blood test strip  Commonly known as:  BAYER CONTOUR NEXT TEST  Use as instructed to check blood sugars once a day     Milk Thistle 150 MG Caps  Take by mouth.     mometasone 220 MCG/INH inhaler  Commonly known as:  ASMANEX  Inhale 2 puffs into the lungs daily.     oxyCODONE-acetaminophen 5-325 MG per tablet  Commonly known as:  ROXICET  Take 1-2 tablets by mouth every 4 (four) hours as needed for severe pain.     PROVENTIL HFA 108 (90 BASE) MCG/ACT inhaler  Generic drug:  albuterol  Inhale 2 puffs into the lungs every 6 (six) hours as needed for wheezing.     Vitamin D (Ergocalciferol) 50000 UNITS Caps capsule  Commonly known as:  DRISDOL  Take 50,000 Units by mouth every 7 (seven) days.        Allergies: No Known Allergies  Past Medical History  Diagnosis Date  . Asthma     Past Surgical History  Procedure Laterality Date  . Tonsillectomy    . Appendectomy    . Cholecystectomy  2008  . Dilation and curettage of uterus    . Knee arthroscopy  2011    left  . Mass excision Right 04/21/2014    Procedure: EXCISION OF RIGHT BACK MASS;  Surgeon: Coralie Keens, MD;  Location: [facility];  Service: General;  Laterality: Right;    Family History  Problem Relation Age of Onset  . Diabetes Mother   . Hypertension Mother     Social History:  reports that she has never smoked. She has never used smokeless tobacco. She reports that she drinks alcohol. She reports that she does not use illicit drugs.  Review of Systems    History of hypertension:  She had been on treatment for the last few years and has been able to get off medications completely with her weight loss.  Home blood pressure readings:130s/80, slightly higher at times with her physician visits  Has had history of  severe psoriasis, is on treatment with Enbrel Still has some joint pains, difficulty sleeping at night and fatigue  Had history of low HDL: This is recently 44 with LDL 95  Lab Results  Component Value Date   CHOL 149 07/28/2013   HDL 42 07/28/2013   LDLCALC 96 07/28/2013   TRIG 56 07/28/2013   CHOLHDL 3.5 07/28/2013      EXAM:  BP 128/84 mmHg  Pulse 68  Temp(Src) 98 F (36.7 C)  Resp 14  Ht 5\' 4"  (1.626 m)  Wt 216 lb (97.977 kg)  BMI 37.06 kg/m2  SpO2 95%    Right lobe enlargement about 2 times palpable, firm, slightly irregular. Left lobe just palpable, firm  Assessment/Plan:   1. Weight gain: She is now gaining a significant amount of weight despite trying to watch her diet consistently.  However she has not exercise consistently. Discussed that she probably has a set point on her body weight and because of recent her endogenous hormonal factors may need to go back on a weight loss drug again.  She is agreeable to going back on Qsymia which had helped her significantly.  She will use the smaller dose for the first 2 weeks and then 7.5 mg until her next visit.Marland Kitchen She will continue her diet and exercise regimen.  2. History of prediabetes: Her glucose is quite normal recently and will continue to monitor without medication.  Will check fasting glucose and A1c on the next visit.  3. Her thyroid enlargement is about the same on the right, just palpable on the left now.  Most likely has a multinodular goiter.  Does not appear to have hypothyroidism now. Will follow her goiter clinically 4. Blood pressure is near normal without medications, relatively higher in the office.  She will continue to monitor at home and hopefully will improve readings with weight loss 5. Continue vitamin D supplement as prescribed by PCP   [facility] 05/25/2014, 8:10 AM

## 2014-05-26 NOTE — Telephone Encounter (Signed)
Pa has been sent

## 2014-06-03 ENCOUNTER — Ambulatory Visit: Payer: BC Managed Care – PPO | Admitting: Endocrinology

## 2014-06-15 ENCOUNTER — Encounter (HOSPITAL_COMMUNITY): Payer: Self-pay | Admitting: *Deleted

## 2014-06-22 ENCOUNTER — Other Ambulatory Visit: Payer: Self-pay | Admitting: Gastroenterology

## 2014-06-28 ENCOUNTER — Encounter (HOSPITAL_COMMUNITY): Payer: Self-pay | Admitting: Certified Registered Nurse Anesthetist

## 2014-06-29 ENCOUNTER — Ambulatory Visit (HOSPITAL_COMMUNITY)
Admission: RE | Admit: 2014-06-29 | Payer: BC Managed Care – PPO | Source: Ambulatory Visit | Admitting: Gastroenterology

## 2014-06-29 HISTORY — DX: Unspecified osteoarthritis, unspecified site: M19.90

## 2014-06-29 SURGERY — COLONOSCOPY WITH PROPOFOL
Anesthesia: Monitor Anesthesia Care

## 2014-06-29 MED ORDER — SODIUM CHLORIDE 0.9 % IJ SOLN
INTRAMUSCULAR | Status: AC
  Filled 2014-06-29: qty 10

## 2014-06-29 MED ORDER — PROPOFOL 10 MG/ML IV BOLUS
INTRAVENOUS | Status: AC
  Filled 2014-06-29: qty 40

## 2014-06-29 MED ORDER — LIDOCAINE HCL (CARDIAC) 20 MG/ML IV SOLN
INTRAVENOUS | Status: AC
  Filled 2014-06-29: qty 5

## 2014-06-29 MED ORDER — ONDANSETRON HCL 4 MG/2ML IJ SOLN
INTRAMUSCULAR | Status: AC
  Filled 2014-06-29: qty 2

## 2014-06-29 MED ORDER — ATROPINE SULFATE 0.4 MG/ML IJ SOLN
INTRAMUSCULAR | Status: AC
  Filled 2014-06-29: qty 1

## 2014-06-29 MED ORDER — EPHEDRINE SULFATE 50 MG/ML IJ SOLN
INTRAMUSCULAR | Status: AC
  Filled 2014-06-29: qty 1

## 2014-07-10 DIAGNOSIS — Z8709 Personal history of other diseases of the respiratory system: Secondary | ICD-10-CM

## 2014-07-10 HISTORY — DX: Personal history of other diseases of the respiratory system: Z87.09

## 2014-08-24 ENCOUNTER — Other Ambulatory Visit: Payer: BC Managed Care – PPO

## 2014-08-28 ENCOUNTER — Ambulatory Visit: Payer: BC Managed Care – PPO | Admitting: Endocrinology

## 2014-09-04 ENCOUNTER — Other Ambulatory Visit (INDEPENDENT_AMBULATORY_CARE_PROVIDER_SITE_OTHER): Payer: BC Managed Care – PPO

## 2014-09-04 DIAGNOSIS — R7301 Impaired fasting glucose: Secondary | ICD-10-CM

## 2014-09-04 LAB — HEMOGLOBIN A1C: Hgb A1c MFr Bld: 5.8 % (ref 4.6–6.5)

## 2014-09-04 LAB — GLUCOSE, RANDOM: Glucose, Bld: 115 mg/dL — ABNORMAL HIGH (ref 70–99)

## 2014-09-24 ENCOUNTER — Encounter: Payer: Self-pay | Admitting: Endocrinology

## 2014-09-24 ENCOUNTER — Ambulatory Visit (INDEPENDENT_AMBULATORY_CARE_PROVIDER_SITE_OTHER): Payer: BC Managed Care – PPO | Admitting: Endocrinology

## 2014-09-24 VITALS — BP 136/74 | HR 68 | Temp 97.7°F | Ht 64.0 in | Wt 211.0 lb

## 2014-09-24 DIAGNOSIS — E041 Nontoxic single thyroid nodule: Secondary | ICD-10-CM

## 2014-09-24 DIAGNOSIS — R7301 Impaired fasting glucose: Secondary | ICD-10-CM

## 2014-09-24 DIAGNOSIS — E669 Obesity, unspecified: Secondary | ICD-10-CM

## 2014-09-24 NOTE — Progress Notes (Signed)
Patient ID: Natasha Lyons, female   DOB: [birth year], 54 y.o.   MRN: [id number]   History of Present Illness:  She is here for periodic followup:  1. OBESITY: Because of failure of diet and exercise to help her lose weight she was on Qsymia in 2014 with excellent results and significant weight loss. Her maximum weight has been about 258 in the past. She stopped Qsymia in 10/14 and initially was able to continue losing weight with lifestyle changes only. Because of her starting to gain back her weight despite watching her diet and some exercise she was started back on Qsymia in 11/15 and she now taking 7.5 mg   She has been trying to reduce her caloric intake and she thinks Qsymia helps this but has not been able to exercise because of her having significant problem with his psoriasis Has lost only  5 pounds   Wt Readings from Last 3 Encounters:  09/24/14 211 lb (95.709 kg)  05/25/14 216 lb (97.977 kg)  04/21/14 212 lb (96.163 kg)     Prediabetes: She had been on metformin previously since 2009 with mostly impaired fasting glucose and normal A1c but she has stopped taking this on her own. Recent fasting glucose is back up to 115 A1c is still normal although relatively higher at 5.9  A1c was 5.2 previously. She is not checking blood sugars at home   3. Vitamin D deficiency: She is taking OTC vitamin D 50,000 units weekly from her PCP since her level had gone down to 21, done by PCP in September 2015       Medication List       This list is accurate as of: 09/24/14  4:25 PM.  Always use your most recent med list.               cetirizine 10 MG tablet  Commonly known as:  ZYRTEC  Take 10 mg by mouth daily.     etanercept 50 MG/ML injection  Commonly known as:  ENBREL  Inject 50 mg into the skin once a week.     Fish Oil 1000 MG Caps  Take by mouth.     glucose blood test strip  Commonly known as:  BAYER CONTOUR NEXT TEST  Use as instructed to check blood sugars once a  day     Milk Thistle 150 MG Caps  Take by mouth.     mometasone 220 MCG/INH inhaler  Commonly known as:  ASMANEX  Inhale 2 puffs into the lungs daily.     oxyCODONE-acetaminophen 5-325 MG per tablet  Commonly known as:  ROXICET  Take 1-2 tablets by mouth every 4 (four) hours as needed for severe pain.     Phentermine-Topiramate 3.75-23 MG Cp24  Commonly known as:  QSYMIA  Take 3.75 mg by mouth daily.     Phentermine-Topiramate 7.5-46 MG Cp24  Commonly known as:  QSYMIA  Take 7.5 mg by mouth daily.     PROVENTIL HFA 108 (90 BASE) MCG/ACT inhaler  Generic drug:  albuterol  Inhale 2 puffs into the lungs every 6 (six) hours as needed for wheezing.     Vitamin D (Ergocalciferol) 50000 UNITS Caps capsule  Commonly known as:  DRISDOL  Take 50,000 Units by mouth every 7 (seven) days.        Allergies: No Known Allergies  Past Medical History  Diagnosis Date  . Asthma     seasonal asthma  . Arthritis     psorriatric  arthritis    Past Surgical History  Procedure Laterality Date  . Tonsillectomy    . Appendectomy    . Cholecystectomy  2008  . Dilation and curettage of uterus    . Knee arthroscopy  2011    left  . Mass excision Right 04/21/2014    Procedure: EXCISION OF RIGHT BACK MASS;  Surgeon: Coralie Keens, MD;  Location: [facility];  Service: General;  Laterality: Right;  . Biopsy thyroid  2014    results benign    Family History  Problem Relation Age of Onset  . Diabetes Mother   . Hypertension Mother     Social History:  reports that she has never smoked. She has never used smokeless tobacco. She reports that she drinks alcohol. She reports that she does not use illicit drugs.  Review of Systems    History of hypertension:  She had been on treatment for the last few years and has been able to get off medications completely with her weight loss.  Home blood pressure readings:none recently  Has had history of severe psoriasis, is on  treatment with Enbrel but this is going to be changed  Had history of low HDL: This is recently 44 with LDL 95  Lab Results  Component Value Date   CHOL 149 07/28/2013   HDL 42 07/28/2013   LDLCALC 96 07/28/2013   TRIG 56 07/28/2013   CHOLHDL 3.5 07/28/2013      EXAM:  BP 136/74 mmHg  Pulse 68  Temp(Src) 97.7 F (36.5 C) (Oral)  Ht 5\' 4"  (1.626 m)  Wt 211 lb (95.709 kg)  BMI 36.20 kg/m2  SpO2 98%   Exam not indicated    Assessment/Plan:   1. Weight gain: She is not able to lose significant amount of weight with Qsymia but this may be partly related to her lack of exercise as well as increased problems with his psoriasis and discomfort.  She thinks she is watching her diet but is not able to exercise as yet. Will try to have her continue the same dose for now and reevaluate in 3 months with for increasing the Qsymia dose since she may be over to start exercising 2. History of prediabetes: Her glucose is relatively higher even though she has not had any steroids.  This may improve with exercise but would consider restarting metformin on her next visit quite normal recently and will continue to monitor without medication.  Will check fasting glucose and A1c on the next visit.     Kelby Lotspeich 09/24/2014, 4:25 PM

## 2014-11-13 ENCOUNTER — Other Ambulatory Visit: Payer: Self-pay | Admitting: *Deleted

## 2014-11-13 MED ORDER — PHENTERMINE-TOPIRAMATE ER 7.5-46 MG PO CP24: 7.5000 mg | ORAL_CAPSULE | Freq: Every day | ORAL | Status: DC

## 2014-12-25 ENCOUNTER — Ambulatory Visit: Payer: BC Managed Care – PPO | Admitting: Endocrinology

## 2014-12-25 ENCOUNTER — Other Ambulatory Visit: Payer: BC Managed Care – PPO

## 2014-12-30 ENCOUNTER — Other Ambulatory Visit (INDEPENDENT_AMBULATORY_CARE_PROVIDER_SITE_OTHER): Payer: BC Managed Care – PPO

## 2014-12-30 DIAGNOSIS — R7301 Impaired fasting glucose: Secondary | ICD-10-CM

## 2014-12-30 DIAGNOSIS — E041 Nontoxic single thyroid nodule: Secondary | ICD-10-CM | POA: Diagnosis not present

## 2014-12-30 LAB — T4, FREE: Free T4: 0.74 ng/dL (ref 0.60–1.60)

## 2014-12-30 LAB — LIPID PANEL
Cholesterol: 175 mg/dL (ref 0–200)
HDL: 50 mg/dL (ref >39.00)
LDL Cholesterol: 115 mg/dL — ABNORMAL HIGH (ref 0–99)
NonHDL: 125
Total CHOL/HDL Ratio: 4
Triglycerides: 48 mg/dL (ref 0.0–149.0)
VLDL: 9.6 mg/dL (ref 0.0–40.0)

## 2014-12-30 LAB — COMPREHENSIVE METABOLIC PANEL
ALT: 21 U/L (ref 0–35)
AST: 20 U/L (ref 0–37)
Albumin: 4.2 g/dL (ref 3.5–5.2)
Alkaline Phosphatase: 66 U/L (ref 39–117)
BUN: 10 mg/dL (ref 6–23)
CO2: 24 mEq/L (ref 19–32)
Calcium: 9.2 mg/dL (ref 8.4–10.5)
Chloride: 109 mEq/L (ref 96–112)
Creatinine, Ser: 0.58 mg/dL (ref 0.40–1.20)
GFR: 140.36 mL/min (ref >60.00)
Glucose, Bld: 125 mg/dL — ABNORMAL HIGH (ref 70–99)
Potassium: 3.9 mEq/L (ref 3.5–5.1)
Sodium: 138 mEq/L (ref 135–145)
Total Bilirubin: 0.5 mg/dL (ref 0.2–1.2)
Total Protein: 7.6 g/dL (ref 6.0–8.3)

## 2014-12-30 LAB — TSH: TSH: 0.85 u[IU]/mL (ref 0.35–4.50)

## 2015-01-13 ENCOUNTER — Encounter: Payer: Self-pay | Admitting: Endocrinology

## 2015-01-13 ENCOUNTER — Ambulatory Visit (INDEPENDENT_AMBULATORY_CARE_PROVIDER_SITE_OTHER): Payer: BC Managed Care – PPO | Admitting: Endocrinology

## 2015-01-13 VITALS — BP 140/96 | HR 60 | Temp 97.7°F | Resp 16 | Ht 64.0 in | Wt 200.8 lb

## 2015-01-13 DIAGNOSIS — E041 Nontoxic single thyroid nodule: Secondary | ICD-10-CM | POA: Diagnosis not present

## 2015-01-13 DIAGNOSIS — I10 Essential (primary) hypertension: Secondary | ICD-10-CM

## 2015-01-13 DIAGNOSIS — E669 Obesity, unspecified: Secondary | ICD-10-CM | POA: Diagnosis not present

## 2015-01-13 DIAGNOSIS — R7309 Other abnormal glucose: Secondary | ICD-10-CM | POA: Diagnosis not present

## 2015-01-13 DIAGNOSIS — R7303 Prediabetes: Secondary | ICD-10-CM

## 2015-01-13 MED ORDER — METFORMIN HCL ER 500 MG PO TB24: 1000.0000 mg | ORAL_TABLET | Freq: Every day | ORAL | Status: DC

## 2015-01-13 NOTE — Patient Instructions (Addendum)
Start taking Metformin 500 mg, 1 tablet with your main meal for 5 days. Occasionally this may initially cause loose stools or nausea. If  tolerating well after 5 days add a second Metformin tablet (500 mg) at the same time.  Continue adding another tablet after 5 days    Check with PCP on BP

## 2015-01-13 NOTE — Progress Notes (Signed)
Patient ID: Natasha Lyons, female   DOB: 1961/04/14, 54 y.o.   MRN: [id number]   History of Present Illness:  She is here for periodic followup:  1. OBESITY:  Because of failure of diet and exercise to help her lose weight she was on Qsymia in 2014 with excellent results and significant weight loss. Her maximum weight has been about 258 in the past. She stopped Qsymia in 10/14 and initially was able to continue losing weight with lifestyle changes only.  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  Recent fasting glucose is back up to 125  A1c is still normal  earlier this year It was relatively higher at 5.8, was 5.2 previously.    Lab Results  Component Value Date   HGBA1C 5.8 09/04/2014    3. Vitamin D deficiency: She is taking OTC vitamin D 50,000 units weekly from her PCP since her level had gone down to 21, done by PCP in September 2015       Medication List       This list is accurate as of: 01/13/15  9:36 PM.  Always use your most recent med list.               cetirizine 10 MG tablet  Commonly known as:  ZYRTEC  Take 10 mg by mouth daily.     diclofenac 25 MG EC tablet  Commonly known as:  VOLTAREN  Take 25 mg by mouth as needed.     etanercept 50 MG/ML injection    Commonly known as:  ENBREL  Inject 50 mg into the skin once a week.     Fish Oil 1000 MG Caps  Take by mouth.     Glucosamine 750 MG Tabs  Take by mouth.     glucose blood test strip  Commonly known as:  BAYER CONTOUR NEXT TEST  Use as instructed to check blood sugars once a day     Milk Thistle 150 MG Caps  Take by mouth.     mometasone 220 MCG/INH inhaler  Commonly known as:  ASMANEX  Inhale 2 puffs into the lungs daily.     oxyCODONE-acetaminophen 5-325 MG per tablet  Commonly known as:  ROXICET  Take 1-2 tablets by mouth every 4 (four) hours as needed for severe pain.     Phentermine-Topiramate 7.5-46 MG Cp24  Commonly known as:  QSYMIA  Take 7.5 mg by mouth daily.     PROVENTIL HFA 108 (90 BASE) MCG/ACT inhaler  Generic drug:  albuterol  Inhale 2 puffs into the lungs every 6 (six) hours as needed for wheezing.     thiamine 100 MG tablet  Commonly known as:  VITAMIN  B-1  Take 100 mg by mouth daily.     traMADol 50 MG tablet  Commonly known as:  ULTRAM  Take 50 mg by mouth every 6 (six) hours as needed.     vitamin A 8000 UNIT capsule  Take 8,000 Units by mouth daily.     Vitamin D (Ergocalciferol) 50000 UNITS Caps capsule  Commonly known as:  DRISDOL  Take 50,000 Units by mouth every 7 (seven) days.     vitamin E 200 UNIT capsule  Take 200 Units by mouth daily.        Allergies: No Known Allergies  Past Medical History  Diagnosis Date  . Asthma     seasonal asthma  . Arthritis     psorriatric arthritis    Past Surgical History  Procedure Laterality Date  . Tonsillectomy    . Appendectomy    . Cholecystectomy  2008  . Dilation and curettage of uterus    . Knee arthroscopy  2011    left  . Mass excision Right 04/21/2014    Procedure: EXCISION OF RIGHT BACK MASS;  Surgeon: Coralie Keens, MD;  Location: [facility];  Service: General;  Laterality: Right;  . Biopsy thyroid  2014    results benign    Family History   Problem Relation Age of Onset  . Diabetes Mother   . Hypertension Mother     Social History:  reports that she has never smoked. She has never used smokeless tobacco. She reports that she drinks alcohol. She reports that she does not use illicit drugs.  Review of Systems    History of hypertension:  She had been on treatment for the last few years and has been able to get off medications completely with her weight loss.  Home blood pressure readings: none recently  Her blood pressure is high on both measurements today in the office  Has had history of severe psoriasis, is on treatment with recent improvement  She has ahistory of low HDL: This is recently improved but LDL is over 100   Lab Results  Component Value Date   CHOL 175 12/30/2014   HDL 50.00 12/30/2014   LDLCALC 115* 12/30/2014   TRIG 48.0 12/30/2014   CHOLHDL 4 12/30/2014    THYROID NODULE:  This has been benign and small and stable in the past  She thinks that she feels a discomfort when she is bending her neck in certain ways Although she had been on thyroid supplements a few years ago she has been euthyroid without any levothyroxine now  Lab Results  Component Value Date   TSH 0.85 12/30/2014   TSH 0.63 11/04/2013   TSH 1.984 07/28/2013   FREET4 0.74 12/30/2014   FREET4 0.89 07/28/2013   FREET4 1.12 04/11/2013      EXAM:  BP 140/96 mmHg  Pulse 60  Temp(Src) 97.7 F (36.5 C)  Resp 16  Ht 5\' 4"  (1.626 m)  Wt 200 lb 12.8 oz (91.082 kg)  BMI 34.45 kg/m2  SpO2 96%   Repeat blood pressure 136/88     Her thyroid is enlarged about 1-1/2-2 times normal but mostly on the right side and firm without any distinct nodule   no edema Biceps reflexes normal.  Assessment/Plan:   1. Weight gain: She is  able to lose significant amount of weight with Qsymia and also with resuming significant amount of exercise  She thinks she is watching her diet  Will try to have her continue the  same dose for now, she  wants to go down to 150 pounds  2. History of prediabetes: Her glucose is  borderline diabetic at 125 now even though she has lost weight and is exercising Explained to the patient that she is likely to be insulin resistant and may have mild insulin deficiency also  Have recommended starting back on metformin and she can go up to 1000 mg a day Needs follow-up in 3 months with A1c  3.  HYPERTENSION: Her blood pressure is significantly high compared to previous measurements but she is reluctant to start medication until seen by her PCP next month for physical  4.  History of thyroid nodule: Clinically stable and still euthyroid.  Reassured her that her thyroid enlargement is not increased and neck discomfort is not likely to be related to her thyroid  5.  HYPERLIPIDEMIA: She has mild increase in LDL which has been inconsistent, currently not a candidate for statin treatment as it has not been consistently high and she does not have overt diabetes or significant family history  6.  Vitamin D deficiency: To be followed by PCP    [facility] 01/13/2015, 9:36 PM

## 2015-02-27 ENCOUNTER — Other Ambulatory Visit: Payer: Self-pay | Admitting: Endocrinology

## 2015-03-18 ENCOUNTER — Ambulatory Visit: Payer: BC Managed Care – PPO | Admitting: Family Medicine

## 2015-04-07 ENCOUNTER — Other Ambulatory Visit (INDEPENDENT_AMBULATORY_CARE_PROVIDER_SITE_OTHER): Payer: BC Managed Care – PPO

## 2015-04-07 ENCOUNTER — Encounter: Payer: Self-pay | Admitting: Family Medicine

## 2015-04-07 ENCOUNTER — Ambulatory Visit (INDEPENDENT_AMBULATORY_CARE_PROVIDER_SITE_OTHER): Payer: BC Managed Care – PPO | Admitting: Family Medicine

## 2015-04-07 VITALS — BP 140/94 | HR 73 | Ht 64.0 in | Wt 204.0 lb

## 2015-04-07 DIAGNOSIS — M25512 Pain in left shoulder: Secondary | ICD-10-CM

## 2015-04-07 DIAGNOSIS — G894 Chronic pain syndrome: Secondary | ICD-10-CM

## 2015-04-07 DIAGNOSIS — E559 Vitamin D deficiency, unspecified: Secondary | ICD-10-CM | POA: Diagnosis not present

## 2015-04-07 DIAGNOSIS — L4050 Arthropathic psoriasis, unspecified: Secondary | ICD-10-CM | POA: Insufficient documentation

## 2015-04-07 MED ORDER — VENLAFAXINE HCL ER 37.5 MG PO CP24: 37.5000 mg | ORAL_CAPSULE | Freq: Every day | ORAL | Status: DC

## 2015-04-07 NOTE — Assessment & Plan Note (Signed)
Increase patient's 50,000 units to twice weekly.last vitamin D was 21 while patient was on the same regimen.

## 2015-04-07 NOTE — Progress Notes (Signed)
Pre visit review using our clinic review tool, if applicable. No additional management support is needed unless otherwise documented below in the visit note. 

## 2015-04-07 NOTE — Progress Notes (Signed)
[name] [place],  [address] Phone: [phone] Subjective:    I'm seeing this patient by the request  of:  Shamleffer, Herschell Dimes, MD   CC: shoulder and neck pain  [id number] Natasha Lyons is a 54 y.o. female coming in with complaint of shoulder and neck pain. Patient states having pain  Usually at all times. Patient hasn't in multiple joints. Patient does have a diagnosis of psoriatic arthritis. Patient states that his season are to lift her arms above her head for more than multiple seconds. Denies any numbness or radiation of pain. Feels like muscles just are weak overall. States that even her knees and hips can give her trouble from time to time. Any range of motion of the shoulder seemed to be difficult. Describes it as a dull aching sensation. Was given an injection in the left shoulder previously with no significant improvement. Has had changes in her psoriatic arthritis medicine with no significant improvement.patient rates the severity of pain of 8 out of 10. Seems to be exacerbated by stress. 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 Patient is also having difficulty at home as well.  Past Medical History  Diagnosis Date  . Asthma     seasonal asthma  . Arthritis     psorriatric arthritis   Past Surgical History  Procedure Laterality Date  . Tonsillectomy    . Appendectomy    . Cholecystectomy  2008  . Dilation and curettage of uterus    . Knee arthroscopy  2011    left  . Mass excision Right 04/21/2014    Procedure: EXCISION OF RIGHT BACK MASS;  Surgeon: Coralie Keens, MD;  Location: [facility];  Service: General;  Laterality: Right;  . Biopsy thyroid  2014    results benign   Social History  Substance Use Topics  . Smoking status: Never Smoker   . Smokeless tobacco: Never Used  .  Alcohol Use: Yes     Comment: occ   No Known Allergies Family History  Problem Relation Age of Onset  . Diabetes Mother   . Hypertension Mother         Past medical history, social, surgical and family history all reviewed in electronic medical record.   Review of Systems: No headache, visual changes, nausea, vomiting, diarrhea, constipation, dizziness, abdominal pain, skin rash, fevers, chills, night sweats, weight loss, swollen lymph nodes, body aches, joint swelling, muscle aches, chest pain, shortness of breath, mood changes.   Objective Blood pressure 140/94, pulse 73, height 5\' 4"  (1.626 m), weight 204 lb (92.534 kg), SpO2 98 %.  General: No apparent distress alert and oriented x3 mood and affect normal, dressed appropriately.  HEENT: Pupils equal, extraocular movements intact  Respiratory: Patient's speak in full sentences and does not appear short of breath  Cardiovascular: No lower extremity edema, non tender, no erythema  Skin: Warm dry intact with no signs of infection or rash on extremities or on axial skeleton.  Abdomen: Soft nontender  Neuro: Cranial nerves II through XII are intact, neurovascularly intact in all extremities with 2+ DTRs and 2+ pulses.  Lymph: No lymphadenopathy of posterior or anterior cervical chain or axillae bilaterally.  Gait normal with good balance and coordination.  MSK:  Non tender with full range of motion and good stability and symmetric strength and tone of shoulders, elbows, wrist,  hip, knee and ankles bilaterally.  Patient has numerous tender areas of the shoulder, hip girdle, and knees bilaterally. Even to light palpation.18 of 20 tender points that are consistent with fibromyalgia is positive today.  Shoulder:left Inspection reveals no abnormalities, atrophy or asymmetry. Palpation is normal with no tenderness over AC joint or bicipital groove. ROM is full in all planes passively but severe pain throughout the range of motion. Rotator  cuff strength normal throughout. Positive impingement Speeds and Yergason's tests normal. No labral pathology noted with negative Obrien's, negative clunk and good stability. Normal scapular function observed. No painful arc and no drop arm sign. No apprehension sign Contralateral shoulder is also tender to palpation to even light palpation.  MSK US performed of: left This study was ordered, performed, and interpreted by Charlann Boxer D.O.  Shoulder:   Supraspinatus:  Appears normal on long and transverse views, no bursal bulge seen with shoulder abduction on impingement view. Infraspinatus:  Appears normal on long and transverse views. Subscapularis:  Appears normal on long and transverse views. Teres Minor:  Appears normal on long and transverse views. AC joint:  Capsule undistended, no geyser sign. Glenohumeral Joint:  Appears normal without effusion. Glenoid Labrum:  Intact without visualized tears. Biceps Tendon:  Appears normal on long and transverse views, no fraying of tendon, tendon located in intertubercular groove, no subluxation with shoulder internal or external rotation. No increased power doppler signal. Impression: Normal shoulder  Procedure note 02725; 15 minutes spent for Therapeutic exercises as stated in above notes.  This included exercises focusing on stretching, strengthening, with significant focus on eccentric aspects.- Shoulder Exercises that included:  Basic scapular stabilization to include adduction and depression of scapula Scaption, focusing on proper movement and good control Internal and External rotation utilizing a theraband, with elbow tucked at side entire time Rows with theraband   Proper technique shown and discussed handout in great detail with ATC.  All questions were discussed and answered.       Impression and Recommendations:     This case required medical decision making of moderate complexity.

## 2015-04-07 NOTE — Patient Instructions (Signed)
Good to see you pennsaid pinkie amount topically 2 times daily as needed.  Exercises 3 times a week.  Turmeric 500mg  twice daily Vitamin D 50000 twice a week. For next 8 weeks.  Tart cherry extract at night Fish oil 2 grams daily.  Effexor 37.5 mg daily for next 3 weeks See me again in 3 weeks.

## 2015-04-07 NOTE — Assessment & Plan Note (Signed)
I do believe the patient has more of a psoriatic arthritis. I do think that patient also has what seems to be more a chronic pain syndrome versus possible fibromyalgia. Patient is having significant increased stress, chronic pain, as well as having difficult he controlled her weight she states. We decided to start on Effexor. In a very low dose. Patient will start this and stop her phenterimine. We discussed other over-the-counter natural supplements that I think will be beneficial. We also discussed exercises. Patient did work with Product/process development scientist today. On exam today and did not find anything specific with the shoulder and I think that this is more of a systemic problem. Patient come back in 3 weeks and we'll consider increasing the medication.

## 2015-04-07 NOTE — Assessment & Plan Note (Signed)
Started on low dose of Effexor. I think that this could be an adjunct per patient's psoriatic arthritis medications.

## 2015-04-09 ENCOUNTER — Telehealth: Payer: Self-pay | Admitting: Family Medicine

## 2015-04-09 NOTE — Telephone Encounter (Signed)
Pt called stated Dr. Tamala Julian want to incrase her vit D but the pharmacy does not have the dosage that he send in. Please advise on what to do and call pt back

## 2015-04-12 MED ORDER — VITAMIN D (ERGOCALCIFEROL) 1.25 MG (50000 UNIT) PO CAPS: 50000.0000 [IU] | ORAL_CAPSULE | ORAL | Status: DC

## 2015-04-12 NOTE — Telephone Encounter (Signed)
Resent rx to pharmacy.  lmvom making pt aware.

## 2015-04-13 ENCOUNTER — Telehealth: Payer: Self-pay | Admitting: Endocrinology

## 2015-04-13 NOTE — Telephone Encounter (Signed)
Please send the lab orders to [facility] on [address] for this pt

## 2015-04-14 ENCOUNTER — Other Ambulatory Visit: Payer: BC Managed Care – PPO

## 2015-04-14 ENCOUNTER — Telehealth: Payer: Self-pay | Admitting: Endocrinology

## 2015-04-14 NOTE — Telephone Encounter (Signed)
Patient would like to get her and her mother Natasha Lyons [birth year]) Labs drawn at [facility], could you fax lab order over or do she need to come get them please advise phone [phone]

## 2015-04-14 NOTE — Telephone Encounter (Signed)
Orders faxed

## 2015-04-21 ENCOUNTER — Ambulatory Visit: Payer: BC Managed Care – PPO | Admitting: Endocrinology

## 2015-05-05 ENCOUNTER — Encounter: Payer: Self-pay | Admitting: Family Medicine

## 2015-05-05 ENCOUNTER — Other Ambulatory Visit (INDEPENDENT_AMBULATORY_CARE_PROVIDER_SITE_OTHER): Payer: BC Managed Care – PPO

## 2015-05-05 ENCOUNTER — Ambulatory Visit (INDEPENDENT_AMBULATORY_CARE_PROVIDER_SITE_OTHER): Payer: BC Managed Care – PPO | Admitting: Family Medicine

## 2015-05-05 VITALS — BP 136/84 | HR 78 | Ht 64.0 in | Wt 205.0 lb

## 2015-05-05 DIAGNOSIS — G894 Chronic pain syndrome: Secondary | ICD-10-CM | POA: Diagnosis not present

## 2015-05-05 DIAGNOSIS — M25512 Pain in left shoulder: Secondary | ICD-10-CM | POA: Diagnosis not present

## 2015-05-05 MED ORDER — VENLAFAXINE HCL ER 75 MG PO CP24: 75.0000 mg | ORAL_CAPSULE | Freq: Every day | ORAL | Status: DC

## 2015-05-05 NOTE — Progress Notes (Signed)
  [facility] [address], [place] [address] Phone: [phone] Subjective:    I'm seeing this patient by the request  of:  Shamleffer, Herschell Dimes, MD   CC: shoulder and neck pain  [id number] Natasha Lyons is a 54 y.o. female coming in with complaint of shoulder and neck pain. Patient was seen previously and there was significant anxiety that seemed to be causing some of the discomfort. Patient was examined. This is more for potential chronic pain syndrome as well. Patient is doing much better. States that she is unable to move a lot better. Denies any numbness. Denies any side effects to the medications. Overall feels like she is making great benefit. Patient is even motivated to start working out again on a regular basis.  Past Medical History  Diagnosis Date  . Asthma     seasonal asthma  . Arthritis     psorriatric arthritis   Past Surgical History  Procedure Laterality Date  . Tonsillectomy    . Appendectomy    . Cholecystectomy  2008  . Dilation and curettage of uterus    . Knee arthroscopy  2011    left  . Mass excision Right 04/21/2014    Procedure: EXCISION OF RIGHT BACK MASS;  Surgeon: Coralie Keens, MD;  Location: [facility];  Service: General;  Laterality: Right;  . Biopsy thyroid  2014    results benign   Social History  Substance Use Topics  . Smoking status: Never Smoker   . Smokeless tobacco: Never Used  . Alcohol Use: Yes     Comment: occ   No Known Allergies Family History  Problem Relation Age of Onset  . Diabetes Mother   . Hypertension Mother         Past medical history, social, surgical and family history all reviewed in electronic medical record.   Review of Systems: No headache, visual changes, nausea, vomiting, diarrhea, constipation, dizziness, abdominal pain, skin rash, fevers, chills, night sweats, weight loss, swollen lymph nodes, body aches, joint swelling, muscle  aches, chest pain, shortness of breath, mood changes.   Objective Blood pressure 136/84, pulse 78, height 5\' 4"  (1.626 m), weight 205 lb (92.987 kg), SpO2 95 %.  General: No apparent distress alert and oriented x3 mood and affect normal, dressed appropriately.  HEENT: Pupils equal, extraocular movements intact  Respiratory: Patient's speak in full sentences and does not appear short of breath  Cardiovascular: No lower extremity edema, non tender, no erythema  Skin: Warm dry intact with no signs of infection or rash on extremities or on axial skeleton.  Abdomen: Soft nontender  Neuro: Cranial nerves II through XII are intact, neurovascularly intact in all extremities with 2+ DTRs and 2+ pulses.  Lymph: No lymphadenopathy of posterior or anterior cervical chain or axillae bilaterally.  Gait normal with good balance and coordination.  MSK:  Non tender with full range of motion and good stability and symmetric strength and tone of shoulders, elbows, wrist, hip, knee and ankles bilaterally.  Patient has numerous tender areas of the shoulder, hip girdle, and knees bilaterally. Continuing to have any different trigger points that is consistent with a possible fibromyalgia but less sensitive than previous exam.       Impression and Recommendations:     This case required medical decision making of moderate complexity.

## 2015-05-05 NOTE — Patient Instructions (Signed)
Good to see you Continue the effexor but will go up to 75mg  daily.  Vitamin D once weekly and then 2000 IU daily Continue the turmeric See me again in 4-6 weeks Give your self a pat on the back.

## 2015-05-05 NOTE — Assessment & Plan Note (Signed)
Patient is doing very well at this time. Encourage her to continue with the flexor but we will increase her dose. Patient will watch her blood pressure. Warned of potential side effects. We discussed staying active. Weight loss would be beneficial. Patient is motivated and will get in the gym. Patient will follow-up again in 4-6 weeks for further evaluation.  Spent  25 minutes with patient face-to-face and had greater than 50% of counseling including as described above in assessment and plan.

## 2015-05-05 NOTE — Progress Notes (Signed)
Pre visit review using our clinic review tool, if applicable. No additional management support is needed unless otherwise documented below in the visit note. 

## 2015-05-06 ENCOUNTER — Ambulatory Visit: Payer: BC Managed Care – PPO | Admitting: Endocrinology

## 2015-05-27 ENCOUNTER — Ambulatory Visit: Payer: BC Managed Care – PPO | Admitting: Endocrinology

## 2015-06-17 ENCOUNTER — Ambulatory Visit: Payer: BC Managed Care – PPO | Admitting: Family Medicine

## 2015-06-28 ENCOUNTER — Ambulatory Visit: Payer: BC Managed Care – PPO | Admitting: Endocrinology

## 2015-07-02 ENCOUNTER — Ambulatory Visit: Payer: BC Managed Care – PPO | Admitting: Family Medicine

## 2015-08-16 ENCOUNTER — Encounter: Payer: Self-pay | Admitting: Family Medicine

## 2015-08-16 ENCOUNTER — Ambulatory Visit (INDEPENDENT_AMBULATORY_CARE_PROVIDER_SITE_OTHER): Payer: BC Managed Care – PPO | Admitting: Family Medicine

## 2015-08-16 VITALS — BP 140/88 | HR 75 | Ht 64.0 in | Wt 218.0 lb

## 2015-08-16 DIAGNOSIS — M06212 Rheumatoid bursitis, left shoulder: Secondary | ICD-10-CM | POA: Diagnosis not present

## 2015-08-16 DIAGNOSIS — L4050 Arthropathic psoriasis, unspecified: Secondary | ICD-10-CM | POA: Diagnosis not present

## 2015-08-16 DIAGNOSIS — G894 Chronic pain syndrome: Secondary | ICD-10-CM | POA: Diagnosis not present

## 2015-08-16 MED ORDER — VENLAFAXINE HCL ER 150 MG PO CP24: 150.0000 mg | ORAL_CAPSULE | Freq: Every day | ORAL | Status: DC

## 2015-08-16 MED ORDER — DICLOFENAC SODIUM 2 % TD SOLN: 2.0000 "application " | Freq: Two times a day (BID) | TRANSDERMAL | Status: DC

## 2015-08-16 NOTE — Progress Notes (Signed)
Pre visit review using our clinic review tool, if applicable. No additional management support is needed unless otherwise documented below in the visit note. 

## 2015-08-16 NOTE — Assessment & Plan Note (Signed)
Discuss again with patient about referral to rheumatology which patient declined.

## 2015-08-16 NOTE — Assessment & Plan Note (Signed)
Given injectin today  Full resolution of pain Discussed HEP, ice rx for pennsaid givne today  RTC in 3 weeks.

## 2015-08-16 NOTE — Patient Instructions (Signed)
Good to see you  Effexor 150 mg daily.  Sent in new prescription pennsaid pinkie amount topically 2 times daily as needed.  Tried injection in your shoulder Exercises 3 times a week.  Increase your vitamin D to 4000 IU daily  See me again in 3 weeks to make sure you are better

## 2015-08-16 NOTE — Assessment & Plan Note (Signed)
Increase patient's Effexor up to 150 kg. Patient warned the potential side effects. I do think that it will be beneficial for this individual. We discussed icing regimen. Discussed home exercises. Discussed an active. Patient will continue the different vitamin supplementations we've discussed previously. Patient will come back and see me again in 3 weeks to make sure she is responding well.

## 2015-08-16 NOTE — Progress Notes (Signed)
[facility] [place],  [address] Phone: [phone] Subjective:    I'm seeing this patient by the request  of:  Shamleffer, Natasha Dimes, MD   CC: shoulder and neck painfollow-up  [id number] Natasha Lyons is a 55 y.o. female coming in with complaint of shoulder and neck pain. Patient is seen for more of a chronic pain syndrome. Found the low vitamin D. Patient has been on once weekly of this. Patient was also put on Effexor. Was responding very well to conservative therapy. Patient states overall she was doing fairly well. Over the course last several weeks she has noticed some increasing discomfort. Seems to be more localized around her left shoulder. States that hurts her with certain range of motion. Patient states that the topical anti-inflammatories do help. Pain is waking her up at night. No radiation down the arm. No significant weakness. Has had to stop doing certain activities of secondary to the discomfort. Changing some of her daily activities like how she dresses secondary to the pain.    Past Medical History  Diagnosis Date  . Asthma     seasonal asthma  . Arthritis     psorriatric arthritis   Past Surgical History  Procedure Laterality Date  . Tonsillectomy    . Appendectomy    . Cholecystectomy  2008  . Dilation and curettage of uterus    . Knee arthroscopy  2011    left  . Mass excision Right 04/21/2014    Procedure: EXCISION OF RIGHT BACK MASS;  Surgeon: Coralie Keens, MD;  Location: [facility];  Service: General;  Laterality: Right;  . Biopsy thyroid  2014    results benign   Social History  Substance Use Topics  . Smoking status: Never Smoker   . Smokeless tobacco: Never Used  . Alcohol Use: Yes     Comment: occ   No Known Allergies Family History  Problem Relation Age of Onset  . Diabetes Mother   . Hypertension Mother         Past medical history, social, surgical  and family history all reviewed in electronic medical record.   Review of Systems: No headache, visual changes, nausea, vomiting, diarrhea, constipation, dizziness, abdominal pain, skin rash, fevers, chills, night sweats, weight loss, swollen lymph nodes, body aches, joint swelling, muscle aches, chest pain, shortness of breath, mood changes.   Objective Blood pressure 140/88, pulse 75, height 5\' 4"  (1.626 m), weight 218 lb (98.884 kg), SpO2 94 %.  General: No apparent distress alert and oriented x3 mood and affect normal, dressed appropriately.  HEENT: Pupils equal, extraocular movements intact  Respiratory: Patient's speak in full sentences and does not appear short of breath  Cardiovascular: No lower extremity edema, non tender, no erythema  Skin: Warm dry intact with no signs of infection or rash on extremities or on axial skeleton.  Abdomen: Soft nontender  Neuro: Cranial nerves II through XII are intact, neurovascularly intact in all extremities with 2+ DTRs and 2+ pulses.  Lymph: No lymphadenopathy of posterior or anterior cervical chain or axillae bilaterally.  Gait normal with good balance and coordination.  MSK:  Non tender with full range of motion and good stability and symmetric strength and tone of shoulders, elbows, wrist, hip, knee and ankles bilaterally.  Patient has numerous tender areas of the shoulder, hip girdle, and knees bilaterally. Multiple trigger point still noted over the upper thoracic spine.  Shoulder: left Inspection reveals no  abnormalities, atrophy or asymmetry. Palpation is normal with no tenderness over AC joint or bicipital groove. ROM is full in all planes passively. Rotator cuff strength normal throughout. signs of impingement with positive Neer and Hawkin's tests, but negative empty can sign. Speeds and Yergason's tests normal. No labral pathology noted with negative Obrien's, negative clunk and good stability. Normal scapular function observed. No  painful arc and no drop arm sign. No apprehension sign Contralateral shoulder unremarkable  After informed written and verbal consent, patient was seated on exam table. Left shoulder was prepped with alcohol swab and utilizing posterior approach, patient's right glenohumeral space was injected with 4:1  marcaine 0.5%: Kenalog 40mg /dL. Patient tolerated the procedure well without immediate complications.      Impression and Recommendations:     This case required medical decision making of moderate complexity.

## 2015-08-23 ENCOUNTER — Other Ambulatory Visit: Payer: Self-pay | Admitting: Endocrinology

## 2015-09-06 ENCOUNTER — Other Ambulatory Visit: Payer: Self-pay | Admitting: Family Medicine

## 2015-09-06 ENCOUNTER — Ambulatory Visit: Payer: BC Managed Care – PPO | Admitting: Family Medicine

## 2015-09-06 NOTE — Telephone Encounter (Signed)
Refill done.  

## 2015-09-22 ENCOUNTER — Ambulatory Visit: Payer: BC Managed Care – PPO | Admitting: Family Medicine

## 2015-10-04 ENCOUNTER — Ambulatory Visit: Payer: BC Managed Care – PPO | Admitting: Internal Medicine

## 2015-10-18 ENCOUNTER — Ambulatory Visit: Payer: BC Managed Care – PPO | Admitting: Family Medicine

## 2015-10-18 DIAGNOSIS — Z0289 Encounter for other administrative examinations: Secondary | ICD-10-CM

## 2016-03-08 ENCOUNTER — Other Ambulatory Visit: Payer: Self-pay | Admitting: Family Medicine

## 2016-03-08 NOTE — Telephone Encounter (Signed)
Refill done.  

## 2016-03-20 NOTE — Progress Notes (Signed)
[name] [place], [place] [address] Phone: [phone] Subjective:    I'm seeing this patient by the request  of:  Ileana Roup, MD   CC: shoulder and neck painfollow-up  [id number]  Natasha Lyons is a 55 y.o. female coming in with complaint of shoulder and neck pain. Patient is seen for more of a chronic pain syndrome. Found the low vitamin D. Patient has been on once weekly of this. Patient was also put on Effexor. States her chronic pain and femoral miles and seems to be doing very well. Having worsening pain of the lower back. Seems to be left-sided. Some mild radiation down the posterior aspect the leg. Patient rates the severity of pain a 7 out of 10. Starting to affect some of the daily activities. States that sometimes her leg feels heavy bullae denies any weakness. Sometimes make it very difficult to sleep at night. Patient is out of tramadol at this moment.    Past Medical History:  Diagnosis Date  . Arthritis    psorriatric arthritis  . Asthma    seasonal asthma   Past Surgical History:  Procedure Laterality Date  . APPENDECTOMY    . BIOPSY THYROID  2014   results benign  . CHOLECYSTECTOMY  2008  . DILATION AND CURETTAGE OF UTERUS    . KNEE ARTHROSCOPY  2011   left  . MASS EXCISION Right 04/21/2014   Procedure: EXCISION OF RIGHT BACK MASS;  Surgeon: Coralie Keens, MD;  Location: [facility];  Service: General;  Laterality: Right;  . TONSILLECTOMY     Social History  Substance Use Topics  . Smoking status: Never Smoker  . Smokeless tobacco: Never Used  . Alcohol use Yes     Comment: occ   No Known Allergies Family History  Problem Relation Age of Onset  . Diabetes Mother   . Hypertension Mother         Past medical history, social, surgical and family history all reviewed in electronic medical record.   Review of Systems: No headache, visual changes, nausea, vomiting,  diarrhea, constipation, dizziness, abdominal pain, skin rash, fevers, chills, night sweats, weight loss, swollen lymph nodes, body aches, joint swelling, muscle aches, chest pain, shortness of breath, mood changes.   Objective  Blood pressure 122/84, pulse 70, weight 224 lb (101.6 kg), last menstrual period 06/08/2014, SpO2 95 %.  General: No apparent distress alert and oriented x3 mood and affect normal, dressed appropriately.  HEENT: Pupils equal, extraocular movements intact  Respiratory: Patient's speak in full sentences and does not appear short of breath  Cardiovascular: No lower extremity edema, non tender, no erythema  Skin: Warm dry intact with no signs of infection or rash on extremities or on axial skeleton.  Abdomen: Soft nontender  Neuro: Cranial nerves II through XII are intact, neurovascularly intact in all extremities with 2+ DTRs and 2+ pulses.  Lymph: No lymphadenopathy of posterior or anterior cervical chain or axillae bilaterally.  Gait normal with good balance and coordination.  MSK:  Non tender with full range of motion and good stability and symmetric strength and tone of shoulders, elbows, wrist, hip, knee and ankles bilaterally.  Patient has numerous tender areas of the shoulder, hip girdle, and knees bilaterally. Multiple trigger point still noted over the upper thoracic spine.  Back Exam:  Inspection: Unremarkable  Motion: Flexion 45 deg, Extension 25 deg, Side Bending to 35 deg bilaterally,  Rotation to 35  deg bilaterally  SLR laying: Positive left XSLR laying: Negative  Palpable tenderness: Tender to palpation over the left-sided sacroiliac joint.Marland Kitchen FABER: Positive left Sensory change: Gross sensation intact to all lumbar and sacral dermatomes.  Reflexes: 2+ at both patellar tendons, 2+ at achilles tendons, Babinski's downgoing.  Strength at foot  Plantar-flexion: 5/5 Dorsi-flexion: 5/5 Eversion: 5/5 Inversion: 5/5  Leg strength  Quad: 5/5 Hamstring: 5/5 Hip  flexor: 5/5 Hip abductors: 5/5  Gait unremarkable.   Osteopathic findings T3 extended rotated and side bent right L2 flexed rotated and side bent right Sacrum left on left     Impression and Recommendations:     This case required medical decision making of moderate complexity.

## 2016-03-21 ENCOUNTER — Ambulatory Visit (INDEPENDENT_AMBULATORY_CARE_PROVIDER_SITE_OTHER): Payer: BC Managed Care – PPO | Admitting: Family Medicine

## 2016-03-21 ENCOUNTER — Encounter: Payer: Self-pay | Admitting: Family Medicine

## 2016-03-21 VITALS — BP 122/84 | HR 70 | Wt 224.0 lb

## 2016-03-21 DIAGNOSIS — M9902 Segmental and somatic dysfunction of thoracic region: Secondary | ICD-10-CM

## 2016-03-21 DIAGNOSIS — G894 Chronic pain syndrome: Secondary | ICD-10-CM

## 2016-03-21 DIAGNOSIS — M9904 Segmental and somatic dysfunction of sacral region: Secondary | ICD-10-CM

## 2016-03-21 DIAGNOSIS — M533 Sacrococcygeal disorders, not elsewhere classified: Secondary | ICD-10-CM | POA: Insufficient documentation

## 2016-03-21 DIAGNOSIS — E559 Vitamin D deficiency, unspecified: Secondary | ICD-10-CM | POA: Diagnosis not present

## 2016-03-21 DIAGNOSIS — L4050 Arthropathic psoriasis, unspecified: Secondary | ICD-10-CM | POA: Diagnosis not present

## 2016-03-21 DIAGNOSIS — M9903 Segmental and somatic dysfunction of lumbar region: Secondary | ICD-10-CM | POA: Diagnosis not present

## 2016-03-21 DIAGNOSIS — M999 Biomechanical lesion, unspecified: Secondary | ICD-10-CM

## 2016-03-21 MED ORDER — GABAPENTIN 100 MG PO CAPS: 200.0000 mg | ORAL_CAPSULE | Freq: Every day | ORAL | 3 refills | Status: DC

## 2016-03-21 MED ORDER — TRAMADOL HCL 50 MG PO TABS: 50.0000 mg | ORAL_TABLET | Freq: Four times a day (QID) | ORAL | 0 refills | Status: DC | PRN

## 2016-03-21 NOTE — Patient Instructions (Signed)
God to see you Ice is your friend Tramadol with tylenol as needed but try to avoid it if you can Duxis 3 times daily for next 6 days Gabapentin 200mg  at night Exercises 3 times a week.  See me again in 2-3 weeks to make sure you are doing well.

## 2016-03-21 NOTE — Assessment & Plan Note (Addendum)
Continue supplementation  ?

## 2016-03-21 NOTE — Assessment & Plan Note (Signed)
Sacroiliac Joint Mobilization and Rehab 1. Work on pretzel stretching, shoulder back and leg draped in front. 3-5 sets, 30 sec.. 2. hip abductor rotations. standing, hip flexion and rotation outward then inward. 3 sets, 15 reps. when can do comfortably, add ankle weights starting at 2 pounds.  3. cross over stretching - shoulder back to ground, same side leg crossover. 3-5 sets for 30 min..  4. rolling up and back knees to chest and rocking. 5. sacral tilt - 5 sets, hold for 5-10 seconds If worsening symptoms we'll consider imaging with patient having psoriatic arthritis and concern for sacroiliac arthritis.

## 2016-03-21 NOTE — Assessment & Plan Note (Signed)
I believe the patient's chronic pain syndrome as well as psoriatic arthritis is likely contributing some of the discomfort. Encourage patient to continue the Effexor could she seems to be doing well. Started on gabapentin low dose at night. Patient is on once weekly vitamin D. We discussed icing regimen. Patient come back and see me again in 3 weeks for further evaluation and treatment. Did respond well to osteopathic manipulation.

## 2016-03-21 NOTE — Assessment & Plan Note (Signed)
Decision today to treat with OMT was based on Physical Exam  After verbal consent patient was treated with HVLA, ME techniques in thoracic, lumbar and sacral areas  Patient tolerated the procedure well with improvement in symptoms  Patient given exercises, stretches and lifestyle modifications  See medications in patient instructions if given  Patient will follow up in 3 weeks      

## 2016-03-22 ENCOUNTER — Ambulatory Visit: Payer: BC Managed Care – PPO | Admitting: Family Medicine

## 2016-04-03 ENCOUNTER — Telehealth: Payer: Self-pay | Admitting: Internal Medicine

## 2016-04-03 NOTE — Telephone Encounter (Signed)
Pt states that she has been experiencing an "electric shock" this past weekend off and on all day. Pt also states that she has been having trouble sleeping and could this be coming from the effexor 150mg .

## 2016-04-03 NOTE — Telephone Encounter (Signed)
Pt called in and said that she has couple of questions about the meds that she was give.  She is having some side effect, dizzy and feels like a shock or sharpe pain in her head

## 2016-04-03 NOTE — Telephone Encounter (Signed)
Yes  We need to go down to 75mg  again  Or we could go to 111.5mg  with 3 37.5 mg Up to patient.

## 2016-04-04 MED ORDER — VENLAFAXINE HCL ER 75 MG PO CP24: ORAL_CAPSULE | ORAL | 1 refills | Status: DC

## 2016-04-04 NOTE — Telephone Encounter (Signed)
Discussed with pt

## 2016-04-10 ENCOUNTER — Ambulatory Visit: Payer: BC Managed Care – PPO | Admitting: Family Medicine

## 2016-04-17 ENCOUNTER — Other Ambulatory Visit: Payer: Self-pay | Admitting: Sports Medicine

## 2016-04-17 DIAGNOSIS — M545 Low back pain: Secondary | ICD-10-CM

## 2016-04-23 ENCOUNTER — Ambulatory Visit
Admission: RE | Admit: 2016-04-23 | Discharge: 2016-04-23 | Disposition: A | Discharge status: Home / Self Care | Payer: BC Managed Care – PPO | Source: Ambulatory Visit | Attending: Sports Medicine | Admitting: Sports Medicine

## 2016-04-23 DIAGNOSIS — M545 Low back pain: Secondary | ICD-10-CM

## 2016-04-23 NOTE — Progress Notes (Signed)
[name] [address] [place], [place] [address] Phone: [phone] Subjective:    I'm seeing this patient by the request  of:  Ileana Roup, MD   CC: shoulder and neck painfollow-up  [id number]  Natasha Lyons is a 55 y.o. female coming in with complaint of shoulder and neck pain. Patient is seen for more of a chronic pain syndrome. Found the low vitamin D. Patient has been on once weekly of this. Patient was also put on Effexor.  Patient was unable to tolerate the 150 mg of x-rays. Patient was given the choice to go down to 75 mg per 111. Patient states He continues to have neck pain. Seems to be more of a tightness. Did respond fairly well to osteopathic manipulation previously. Denies any deviation into the arms. Patient denies any weakness.  Patient has had low back pain. Was seen another provider. Patient was sent for an MRI. MRI was inability visualized by me. 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 Patient continues to have pain radiating down the leg. States that it's more on the left lateral and somewhat anterior aspect of the thigh.    Past Medical History:  Diagnosis Date  . Arthritis    psorriatric arthritis  . Asthma    seasonal asthma   Past Surgical History:  Procedure Laterality Date  . APPENDECTOMY    . BIOPSY THYROID  2014   results benign  . CHOLECYSTECTOMY  2008  . DILATION AND CURETTAGE OF UTERUS    . KNEE ARTHROSCOPY  2011   left  . MASS EXCISION Right 04/21/2014   Procedure: EXCISION OF RIGHT BACK MASS;  Surgeon: Coralie Keens, MD;  Location: [facility];  Service: General;  Laterality: Right;  . TONSILLECTOMY     Social History  Substance Use Topics  . Smoking status: Never Smoker  . Smokeless tobacco: Never Used  . Alcohol use Yes     Comment: occ   No Known Allergies Family History  Problem Relation Age of Onset  . Diabetes Mother   .  Hypertension Mother         Past medical history, social, surgical and family history all reviewed in electronic medical record.   Review of Systems: No headache, visual changes, nausea, vomiting, diarrhea, constipation, dizziness, abdominal pain, skin rash, fevers, chills, night sweats, weight loss, swollen lymph nodes, body aches, joint swelling, muscle aches, chest pain, shortness of breath, mood changes.   Objective  Last menstrual period 06/08/2014.  General: No apparent distress alert and oriented x3 mood and affect normal, dressed appropriately.  HEENT: Pupils equal, extraocular movements intact  Respiratory: Patient's speak in full sentences and does not appear short of breath  Cardiovascular: No lower extremity edema, non tender, no erythema  Skin: Warm dry intact with no signs of infection or rash on extremities or on axial skeleton.  Abdomen: Soft nontender  Neuro: Cranial nerves II through XII are intact, neurovascularly intact in all extremities with 2+ DTRs and 2+ pulses.  Lymph: No lymphadenopathy of posterior or anterior cervical chain or axillae bilaterally.  Gait normal with good balance and coordination.  MSK:  Non tender with full range of motion and good stability and symmetric strength and tone of shoulders, elbows, wrist, hip, knee and ankles bilaterally.  Patient has numerous tender areas of the shoulder, hip girdle, and knees bilaterally. Multiple trigger point still noted over the upper thoracic spine.  Back Exam:  Inspection: Unremarkable  Motion: Flexion 35 deg, Extension 15 deg, Side Bending to 35 deg bilaterally,  Rotation to 35 deg bilaterally  SLR laying: Positive left XSLR laying: Negative  Palpable tenderness: Worsening tenderness to palpation in the paraspinal musculature. FABER: Positive left Sensory change: Gross sensation intact to all lumbar and sacral dermatomes.  Reflexes: 2+ at both patellar tendons, 2+ at achilles tendons, Babinski's  downgoing.  Strength at foot  Plantar-flexion: 5/5 Dorsi-flexion: 5/5 Eversion: 5/5 Inversion: 5/5  Leg strength  Quad: 5/5 Hamstring: 5/5 Hip flexor: 5/5 Hip abductors: 5/5  Gait unremarkable.   Osteopathic findings C3 flexed rotated and side bent right T2 extended rotated and side bent left with inhaled second rib T3 extended rotated and side bent right      Impression and Recommendations:     This case required medical decision making of moderate complexity.

## 2016-04-24 ENCOUNTER — Encounter: Payer: Self-pay | Admitting: Family Medicine

## 2016-04-24 ENCOUNTER — Ambulatory Visit (INDEPENDENT_AMBULATORY_CARE_PROVIDER_SITE_OTHER): Payer: BC Managed Care – PPO | Admitting: Family Medicine

## 2016-04-24 DIAGNOSIS — M999 Biomechanical lesion, unspecified: Secondary | ICD-10-CM | POA: Diagnosis not present

## 2016-04-24 DIAGNOSIS — M5416 Radiculopathy, lumbar region: Secondary | ICD-10-CM

## 2016-04-24 DIAGNOSIS — G894 Chronic pain syndrome: Secondary | ICD-10-CM

## 2016-04-24 NOTE — Patient Instructions (Signed)
Good to see you  Sorry for the effexor The gabapentin 200mg  at night would be a great idea.  Stay active.  We will get a nerve root injection to see if that helps your lower back  Do not sleep with your elbow above your shoulder See me again 2 weeks after the injection.

## 2016-04-24 NOTE — Assessment & Plan Note (Signed)
Decision today to treat with OMT was based on Physical Exam  After verbal consent patient was treated with HVLA, ME techniques in thoracic, cervical and rib l areas  Patient tolerated the procedure well with improvement in symptoms  Patient given exercises, stretches and lifestyle modifications  See medications in patient instructions if given  Patient will follow up in 3 weeks

## 2016-04-24 NOTE — Assessment & Plan Note (Signed)
Continue Effexor.

## 2016-04-24 NOTE — Assessment & Plan Note (Signed)
Patient does have a nerve root impingement noted. Patient will be set up for a nerve root injection. We discussed icing regimen and home exercises. Discussed which activities to do an which was potentially avoid. Patient will continue to be active. Patient will come back and see me again in 2 weeks after the injection we'll see how patient does.

## 2016-04-25 ENCOUNTER — Other Ambulatory Visit: Payer: Self-pay | Admitting: *Deleted

## 2016-04-25 DIAGNOSIS — M5416 Radiculopathy, lumbar region: Secondary | ICD-10-CM

## 2016-05-04 ENCOUNTER — Other Ambulatory Visit: Payer: BC Managed Care – PPO

## 2016-05-05 ENCOUNTER — Other Ambulatory Visit: Payer: BC Managed Care – PPO

## 2016-05-08 ENCOUNTER — Other Ambulatory Visit: Payer: Self-pay | Admitting: *Deleted

## 2016-05-08 MED ORDER — VENLAFAXINE HCL ER 75 MG PO CP24: ORAL_CAPSULE | ORAL | 1 refills | Status: DC

## 2016-05-08 NOTE — Telephone Encounter (Signed)
Venlafaxine er 75mg  sent into pharmacy.

## 2016-05-24 ENCOUNTER — Other Ambulatory Visit: Payer: Self-pay | Admitting: Nurse Practitioner

## 2016-05-25 ENCOUNTER — Ambulatory Visit: Payer: Self-pay | Admitting: General Surgery

## 2016-06-02 ENCOUNTER — Encounter (HOSPITAL_COMMUNITY)
Admission: RE | Admit: 2016-06-02 | Discharge: 2016-06-02 | Disposition: A | Discharge status: Home / Self Care | Payer: BC Managed Care – PPO | Source: Ambulatory Visit | Attending: General Surgery | Admitting: General Surgery

## 2016-06-02 ENCOUNTER — Other Ambulatory Visit: Payer: Self-pay

## 2016-06-02 ENCOUNTER — Encounter (HOSPITAL_COMMUNITY): Payer: Self-pay

## 2016-06-02 DIAGNOSIS — Z01812 Encounter for preprocedural laboratory examination: Secondary | ICD-10-CM | POA: Insufficient documentation

## 2016-06-02 HISTORY — DX: Pneumonia, unspecified organism: J18.9

## 2016-06-02 HISTORY — DX: Personal history of other infectious and parasitic diseases: Z86.19

## 2016-06-02 HISTORY — DX: Other muscle spasm: M62.838

## 2016-06-02 HISTORY — DX: Headache, unspecified: R51.9

## 2016-06-02 HISTORY — DX: Other chronic pain: G89.29

## 2016-06-02 HISTORY — DX: Personal history of other diseases of the respiratory system: Z87.09

## 2016-06-02 HISTORY — DX: Effusion, unspecified joint: M25.40

## 2016-06-02 HISTORY — DX: Fibromyalgia: M79.7

## 2016-06-02 HISTORY — DX: Unspecified osteoarthritis, unspecified site: M19.90

## 2016-06-02 HISTORY — DX: Dorsalgia, unspecified: M54.9

## 2016-06-02 HISTORY — DX: Cough: R05

## 2016-06-02 HISTORY — DX: Headache: R51

## 2016-06-02 HISTORY — DX: Allergy, unspecified, initial encounter: T78.40XA

## 2016-06-02 HISTORY — DX: Pain in unspecified joint: M25.50

## 2016-06-02 HISTORY — DX: Cough, unspecified: R05.9

## 2016-06-02 HISTORY — DX: Family history of other specified conditions: Z84.89

## 2016-06-02 HISTORY — DX: Essential (primary) hypertension: I10

## 2016-06-02 LAB — BASIC METABOLIC PANEL
Anion gap: 8 (ref 5–15)
BUN: 9 mg/dL (ref 6–20)
CO2: 27 mmol/L (ref 22–32)
Calcium: 9.3 mg/dL (ref 8.9–10.3)
Chloride: 104 mmol/L (ref 101–111)
Creatinine, Ser: 0.59 mg/dL (ref 0.44–1.00)
GFR calc Af Amer: >60 mL/min (ref >60)
GFR calc non Af Amer: >60 mL/min (ref >60)
Glucose, Bld: 133 mg/dL — ABNORMAL HIGH (ref 65–99)
Potassium: 3.9 mmol/L (ref 3.5–5.1)
Sodium: 139 mmol/L (ref 135–145)

## 2016-06-02 LAB — HCG, SERUM, QUALITATIVE: Preg, Serum: NEGATIVE

## 2016-06-02 LAB — CBC
HCT: 40.2 % (ref 36.0–46.0)
Hemoglobin: 13 g/dL (ref 12.0–15.0)
MCH: 26.5 pg (ref 26.0–34.0)
MCHC: 32.3 g/dL (ref 30.0–36.0)
MCV: 81.9 fL (ref 78.0–100.0)
Platelets: 267 10*3/uL (ref 150–400)
RBC: 4.91 MIL/uL (ref 3.87–5.11)
RDW: 14.1 % (ref 11.5–15.5)
WBC: 8.7 10*3/uL (ref 4.0–10.5)

## 2016-06-02 MED ORDER — CHLORHEXIDINE GLUCONATE CLOTH 2 % EX PADS: 6.0000 | MEDICATED_PAD | Freq: Once | CUTANEOUS | Status: DC

## 2016-06-02 NOTE — Progress Notes (Addendum)
Cardiologist denies  Medical Md is Dr.Ibethal Shamlef  Echo/stress test/heart cath denies  EKG to request from [facility] denies

## 2016-06-02 NOTE — Pre-Procedure Instructions (Signed)
Natasha Lyons  06/02/2016      [facility] - [address], [place] [address] Phone: [phone] Fax: [phone]  [id number] - [place], [place] - [address] [facility] [place] [address] [place] [address] Phone: [phone] Fax: [place], [place], [place] - [address] [place] [address] Phone: [phone] Fax: [phone]    Your procedure is scheduled on Thurs, Dec 7 @ 1:30 PM  Report to [facility] Admitting at 11:30 AM  Call this number if you have problems the morning of surgery:  [phone]   Remember:  Do not eat food or drink liquids after midnight.  Take these medicines the morning of surgery with A SIP OF WATER Albuterol<Bring Your Inhaler With You>,Amlodipine(Norvasc),Zyrtec(Cetirizine),Gabapentin(Neurontin),and Effexor(Venlafaxine)              No Goody's,BC's,Aleve,Advil,Motrin,Ibuprofen,Fish Oil,or any Herbal Medications.      How to Manage Your Diabetes Before and After Surgery  Why is it important to control my blood sugar before and after surgery? . Improving blood sugar levels before and after surgery helps healing and can limit problems. . A way of improving blood sugar control is eating a healthy diet by: o  Eating less sugar and carbohydrates o  Increasing activity/exercise o  Talking with your doctor about reaching your blood sugar goals . High blood sugars (greater than 180 mg/dL) can raise your risk of infections and slow your recovery, so you will need to focus on controlling your diabetes during the weeks before surgery. . Make sure that the doctor who takes care of your diabetes knows about your planned surgery including the date and location.  How do I manage my blood sugar before surgery? . Check your blood sugar at least 4 times a day, starting 2 days before surgery, to  make sure that the level is not too high or low. o Check your blood sugar the morning of your surgery when you wake up and every 2 hours until you get to the Short Stay unit. . If your blood sugar is less than 70 mg/dL, you will need to treat for low blood sugar: o Do not take insulin. o Treat a low blood sugar (less than 70 mg/dL) with  cup of clear juice (cranberry or apple), 4 glucose tablets, OR glucose gel. o Recheck blood sugar in 15 minutes after treatment (to make sure it is greater than 70 mg/dL). If your blood sugar is not greater than 70 mg/dL on recheck, call [phone] for further instructions. . Report your blood sugar to the short stay nurse when you get to Short Stay.  . If you are admitted to the hospital after surgery: o Your blood sugar will be checked by the staff and you will probably be given insulin after surgery (instead of oral diabetes medicines) to make sure you have good blood sugar levels. o The goal for blood sugar control after surgery is 80-180 mg/dL.              WHAT DO I DO ABOUT MY DIABETES MEDICATION?   Marland Kitchen Do not take oral diabetes medicines (pills) the morning of surgery.      . The day of surgery, do not take other diabetes injectables, including Byetta (exenatide), Bydureon (exenatide ER), Victoza (liraglutide), or Trulicity (dulaglutide).  . If your CBG is greater than 220  mg/dL, you may take  of your sliding scale (correction) dose of insulin.  Other Instructions:          Patient Signature:  Date:   Nurse Signature:  Date:   Reviewed and Endorsed by [facility] Patient Education Committee, August 2015   Do not wear jewelry, make-up or nail polish.  Do not wear lotions, powders, perfumes, or deoderant.  Do not shave 48 hours prior to surgery.    Do not bring valuables to the hospital.  [facility] is not responsible for any belongings or valuables.  Contacts, dentures or bridgework may not be worn into surgery.  Leave  your suitcase in the car.  After surgery it may be brought to your room.  For patients admitted to the hospital, discharge time will be determined by your treatment team.  Patients discharged the day of surgery will not be allowed to drive home.    [facility]  Before surgery, you can play an important role.  Because skin is not sterile, your skin needs to be as free of germs as possible.  You can reduce the number of germs on you skin by washing with CHG (chlorahexidine gluconate) soap before surgery.  CHG is an antiseptic cleaner which kills germs and bonds with the skin to continue killing germs even after washing.  Please DO NOT use if you have an allergy to CHG or antibacterial soaps.  If your skin becomes reddened/irritated stop using the CHG and inform your nurse when you arrive at Short Stay.  Do not shave (including legs and underarms) for at least 48 hours prior to the first CHG shower.  You may shave your face.  Please follow these instructions carefully:   1.  Shower with CHG Soap the night before surgery and the                                morning of Surgery.  2.  If you choose to wash your hair, wash your hair first as usual with your       normal shampoo.  3.  After you shampoo, rinse your hair and body thoroughly to remove the                      Shampoo.  4.  Use CHG as you would any other liquid soap.  You can apply chg directly       to the skin and wash gently with scrungie or a clean washcloth.  5.  Apply the CHG Soap to your body ONLY FROM THE NECK DOWN.        Do not use on open wounds or open sores.  Avoid contact with your eyes,       ears, mouth and genitals (private parts).  Wash genitals (private parts)       with your normal soap.  6.  Wash thoroughly, paying special attention to the area where your surgery        will be performed.  7.  Thoroughly rinse your body with warm water from the neck down.  8.  DO NOT  shower/wash with your normal soap after using and rinsing off       the CHG Soap.  9.  Pat yourself dry with a clean towel.            10.  Wear clean pajamas.  11.  Place clean sheets on your bed the night of your first shower and do not        sleep with pets.  Day of Surgery  Do not apply any lotions/deoderants the morning of surgery.  Please wear clean clothes to the hospital/surgery center.    Please read over the following fact sheets that you were given. Pain Booklet, Coughing and Deep Breathing and Surgical Site Infection Prevention

## 2016-06-11 ENCOUNTER — Other Ambulatory Visit: Payer: Self-pay | Admitting: Family Medicine

## 2016-06-12 NOTE — Telephone Encounter (Signed)
Refill done.  

## 2016-06-15 ENCOUNTER — Ambulatory Visit (HOSPITAL_COMMUNITY)
Admission: RE | Admit: 2016-06-15 | Discharge: 2016-06-17 | Disposition: A | Discharge status: Home / Self Care | Payer: BC Managed Care – PPO | Source: Ambulatory Visit | Attending: General Surgery | Admitting: General Surgery

## 2016-06-15 ENCOUNTER — Ambulatory Visit (HOSPITAL_COMMUNITY): Payer: BC Managed Care – PPO | Admitting: Anesthesiology

## 2016-06-15 ENCOUNTER — Ambulatory Visit (HOSPITAL_COMMUNITY): Payer: BC Managed Care – PPO | Admitting: Emergency Medicine

## 2016-06-15 ENCOUNTER — Encounter (HOSPITAL_COMMUNITY)
Admission: RE | Disposition: A | Discharge status: Home / Self Care | Payer: Self-pay | Source: Ambulatory Visit | Attending: General Surgery

## 2016-06-15 ENCOUNTER — Encounter (HOSPITAL_COMMUNITY): Payer: Self-pay | Admitting: Anesthesiology

## 2016-06-15 DIAGNOSIS — K436 Other and unspecified ventral hernia with obstruction, without gangrene: Secondary | ICD-10-CM | POA: Diagnosis not present

## 2016-06-15 DIAGNOSIS — K439 Ventral hernia without obstruction or gangrene: Secondary | ICD-10-CM | POA: Diagnosis present

## 2016-06-15 DIAGNOSIS — Z23 Encounter for immunization: Secondary | ICD-10-CM | POA: Diagnosis not present

## 2016-06-15 DIAGNOSIS — Z6841 Body Mass Index (BMI) 40.0 and over, adult: Secondary | ICD-10-CM | POA: Insufficient documentation

## 2016-06-15 DIAGNOSIS — I10 Essential (primary) hypertension: Secondary | ICD-10-CM | POA: Diagnosis not present

## 2016-06-15 DIAGNOSIS — J45909 Unspecified asthma, uncomplicated: Secondary | ICD-10-CM | POA: Diagnosis not present

## 2016-06-15 HISTORY — PX: VENTRAL HERNIA REPAIR: SHX424

## 2016-06-15 HISTORY — PX: INSERTION OF MESH: SHX5868

## 2016-06-15 SURGERY — REPAIR, HERNIA, VENTRAL, LAPAROSCOPIC
Anesthesia: General | Site: Abdomen

## 2016-06-15 MED ORDER — METHOCARBAMOL 500 MG PO TABS: 500.0000 mg | ORAL_TABLET | Freq: Four times a day (QID) | ORAL | Status: DC | PRN

## 2016-06-15 MED ORDER — HEPARIN SODIUM (PORCINE) 5000 UNIT/ML IJ SOLN
5000.0000 [IU] | Freq: Three times a day (TID) | INTRAMUSCULAR | Status: DC
  Administered 2016-06-16 – 2016-06-17 (×4): 5000 [IU] via SUBCUTANEOUS
  Filled 2016-06-15 (×4): qty 1

## 2016-06-15 MED ORDER — CEFAZOLIN SODIUM-DEXTROSE 2-4 GM/100ML-% IV SOLN
2.0000 g | INTRAVENOUS | Status: AC
  Administered 2016-06-15: 2 g via INTRAVENOUS
  Filled 2016-06-15: qty 100

## 2016-06-15 MED ORDER — LIDOCAINE HCL (CARDIAC) 20 MG/ML IV SOLN
INTRAVENOUS | Status: DC | PRN
  Administered 2016-06-15: 60 mg via INTRAVENOUS

## 2016-06-15 MED ORDER — MIDAZOLAM HCL 5 MG/5ML IJ SOLN
INTRAMUSCULAR | Status: DC | PRN
  Administered 2016-06-15: 2 mg via INTRAVENOUS

## 2016-06-15 MED ORDER — MORPHINE SULFATE (PF) 2 MG/ML IV SOLN
1.0000 mg | INTRAVENOUS | Status: DC | PRN
  Administered 2016-06-15 (×2): 2 mg via INTRAVENOUS
  Filled 2016-06-15 (×3): qty 1

## 2016-06-15 MED ORDER — METFORMIN HCL ER 500 MG PO TB24
500.0000 mg | ORAL_TABLET | Freq: Every day | ORAL | Status: DC
  Administered 2016-06-16 – 2016-06-17 (×2): 500 mg via ORAL
  Filled 2016-06-15 (×2): qty 1

## 2016-06-15 MED ORDER — GABAPENTIN 100 MG PO CAPS
200.0000 mg | ORAL_CAPSULE | Freq: Every day | ORAL | Status: DC
  Administered 2016-06-15 – 2016-06-16 (×2): 200 mg via ORAL
  Filled 2016-06-15 (×2): qty 2

## 2016-06-15 MED ORDER — HYDROMORPHONE HCL 1 MG/ML IJ SOLN
INTRAMUSCULAR | Status: AC
  Filled 2016-06-15: qty 0.5

## 2016-06-15 MED ORDER — LIDOCAINE 2% (20 MG/ML) 5 ML SYRINGE
INTRAMUSCULAR | Status: AC
  Filled 2016-06-15: qty 5

## 2016-06-15 MED ORDER — FENTANYL CITRATE (PF) 100 MCG/2ML IJ SOLN
INTRAMUSCULAR | Status: DC | PRN
  Administered 2016-06-15: 100 ug via INTRAVENOUS
  Administered 2016-06-15 (×2): 50 ug via INTRAVENOUS

## 2016-06-15 MED ORDER — PANTOPRAZOLE SODIUM 40 MG IV SOLR
40.0000 mg | Freq: Every day | INTRAVENOUS | Status: DC
  Administered 2016-06-15: 40 mg via INTRAVENOUS
  Filled 2016-06-15: qty 40

## 2016-06-15 MED ORDER — PROPOFOL 10 MG/ML IV BOLUS
INTRAVENOUS | Status: AC
  Filled 2016-06-15: qty 20

## 2016-06-15 MED ORDER — ROCURONIUM BROMIDE 100 MG/10ML IV SOLN
INTRAVENOUS | Status: DC | PRN
  Administered 2016-06-15 (×2): 20 mg via INTRAVENOUS
  Administered 2016-06-15: 10 mg via INTRAVENOUS
  Administered 2016-06-15: 50 mg via INTRAVENOUS

## 2016-06-15 MED ORDER — SUGAMMADEX SODIUM 200 MG/2ML IV SOLN
INTRAVENOUS | Status: AC
  Filled 2016-06-15: qty 2

## 2016-06-15 MED ORDER — ONDANSETRON HCL 4 MG/2ML IJ SOLN
INTRAMUSCULAR | Status: DC | PRN
  Administered 2016-06-15: 4 mg via INTRAVENOUS

## 2016-06-15 MED ORDER — MIDAZOLAM HCL 2 MG/2ML IJ SOLN
INTRAMUSCULAR | Status: AC
  Filled 2016-06-15: qty 2

## 2016-06-15 MED ORDER — PROMETHAZINE HCL 25 MG/ML IJ SOLN: 6.2500 mg | INTRAMUSCULAR | Status: DC | PRN

## 2016-06-15 MED ORDER — VENLAFAXINE HCL ER 75 MG PO CP24
75.0000 mg | ORAL_CAPSULE | Freq: Every day | ORAL | Status: DC
  Administered 2016-06-16 – 2016-06-17 (×2): 75 mg via ORAL
  Filled 2016-06-15 (×2): qty 1

## 2016-06-15 MED ORDER — ROCURONIUM BROMIDE 10 MG/ML (PF) SYRINGE
PREFILLED_SYRINGE | INTRAVENOUS | Status: AC
  Filled 2016-06-15: qty 10

## 2016-06-15 MED ORDER — BUPIVACAINE-EPINEPHRINE 0.25% -1:200000 IJ SOLN
INTRAMUSCULAR | Status: DC | PRN
  Administered 2016-06-15: 13 mL

## 2016-06-15 MED ORDER — ONDANSETRON 4 MG PO TBDP: 4.0000 mg | ORAL_TABLET | Freq: Four times a day (QID) | ORAL | Status: DC | PRN

## 2016-06-15 MED ORDER — PHENYLEPHRINE 40 MCG/ML (10ML) SYRINGE FOR IV PUSH (FOR BLOOD PRESSURE SUPPORT)
PREFILLED_SYRINGE | INTRAVENOUS | Status: AC
  Filled 2016-06-15: qty 10

## 2016-06-15 MED ORDER — KETOROLAC TROMETHAMINE 30 MG/ML IJ SOLN: 30.0000 mg | Freq: Once | INTRAMUSCULAR | Status: DC | PRN

## 2016-06-15 MED ORDER — ALBUTEROL SULFATE HFA 108 (90 BASE) MCG/ACT IN AERS
INHALATION_SPRAY | RESPIRATORY_TRACT | Status: DC | PRN
  Administered 2016-06-15: 2 via RESPIRATORY_TRACT
  Administered 2016-06-15: 4 via RESPIRATORY_TRACT

## 2016-06-15 MED ORDER — KCL IN DEXTROSE-NACL 20-5-0.9 MEQ/L-%-% IV SOLN
INTRAVENOUS | Status: DC
Start: 2016-06-15 — End: 2016-06-17
  Administered 2016-06-15 – 2016-06-16 (×2): via INTRAVENOUS
  Filled 2016-06-15 (×5): qty 1000

## 2016-06-15 MED ORDER — ALBUTEROL SULFATE HFA 108 (90 BASE) MCG/ACT IN AERS
INHALATION_SPRAY | RESPIRATORY_TRACT | Status: AC
  Filled 2016-06-15: qty 6.7

## 2016-06-15 MED ORDER — SUGAMMADEX SODIUM 200 MG/2ML IV SOLN
INTRAVENOUS | Status: DC | PRN
  Administered 2016-06-15: 200 mg via INTRAVENOUS

## 2016-06-15 MED ORDER — EPHEDRINE SULFATE 50 MG/ML IJ SOLN
INTRAMUSCULAR | Status: DC | PRN
  Administered 2016-06-15 (×3): 5 mg via INTRAVENOUS

## 2016-06-15 MED ORDER — 0.9 % SODIUM CHLORIDE (POUR BTL) OPTIME
TOPICAL | Status: DC | PRN
  Administered 2016-06-15: 1000 mL

## 2016-06-15 MED ORDER — ONDANSETRON HCL 4 MG/2ML IJ SOLN: 4.0000 mg | Freq: Four times a day (QID) | INTRAMUSCULAR | Status: DC | PRN

## 2016-06-15 MED ORDER — ACETAMINOPHEN 10 MG/ML IV SOLN
INTRAVENOUS | Status: DC | PRN
  Administered 2016-06-15: 1000 mg via INTRAVENOUS

## 2016-06-15 MED ORDER — ALBUTEROL SULFATE (2.5 MG/3ML) 0.083% IN NEBU: 2.5000 mg | INHALATION_SOLUTION | Freq: Four times a day (QID) | RESPIRATORY_TRACT | Status: DC | PRN

## 2016-06-15 MED ORDER — PHENYLEPHRINE HCL 10 MG/ML IJ SOLN
INTRAMUSCULAR | Status: DC | PRN
  Administered 2016-06-15 (×2): 40 ug via INTRAVENOUS

## 2016-06-15 MED ORDER — ACETAMINOPHEN 10 MG/ML IV SOLN
1000.0000 mg | INTRAVENOUS | Status: DC
  Filled 2016-06-15: qty 100

## 2016-06-15 MED ORDER — FENTANYL CITRATE (PF) 100 MCG/2ML IJ SOLN
INTRAMUSCULAR | Status: AC
  Filled 2016-06-15: qty 4

## 2016-06-15 MED ORDER — HYDROCODONE-ACETAMINOPHEN 5-325 MG PO TABS
1.0000 | ORAL_TABLET | ORAL | Status: DC | PRN
Start: 2016-06-15 — End: 2016-06-17
  Administered 2016-06-15 – 2016-06-17 (×5): 2 via ORAL
  Filled 2016-06-15 (×5): qty 2

## 2016-06-15 MED ORDER — PROPOFOL 10 MG/ML IV BOLUS
INTRAVENOUS | Status: DC | PRN
  Administered 2016-06-15: 40 mg via INTRAVENOUS
  Administered 2016-06-15: 160 mg via INTRAVENOUS

## 2016-06-15 MED ORDER — PNEUMOCOCCAL VAC POLYVALENT 25 MCG/0.5ML IJ INJ
0.5000 mL | INJECTION | INTRAMUSCULAR | Status: AC
  Administered 2016-06-17: 0.5 mL via INTRAMUSCULAR
  Filled 2016-06-15: qty 0.5

## 2016-06-15 MED ORDER — BUPIVACAINE-EPINEPHRINE (PF) 0.25% -1:200000 IJ SOLN
INTRAMUSCULAR | Status: AC
  Filled 2016-06-15: qty 30

## 2016-06-15 MED ORDER — HYDROMORPHONE HCL 1 MG/ML IJ SOLN
0.2500 mg | INTRAMUSCULAR | Status: DC | PRN
  Administered 2016-06-15 (×2): 0.5 mg via INTRAVENOUS

## 2016-06-15 MED ORDER — DEXAMETHASONE SODIUM PHOSPHATE 10 MG/ML IJ SOLN
INTRAMUSCULAR | Status: DC | PRN
  Administered 2016-06-15: 10 mg via INTRAVENOUS

## 2016-06-15 MED ORDER — LACTATED RINGERS IV SOLN
INTRAVENOUS | Status: DC
  Administered 2016-06-15: 11:00:00 via INTRAVENOUS
  Administered 2016-06-15 (×2): 50 mL/h via INTRAVENOUS

## 2016-06-15 MED ORDER — AMLODIPINE BESYLATE 5 MG PO TABS
5.0000 mg | ORAL_TABLET | Freq: Every day | ORAL | Status: DC
  Administered 2016-06-16 – 2016-06-17 (×2): 5 mg via ORAL
  Filled 2016-06-15 (×2): qty 1

## 2016-06-15 SURGICAL SUPPLY — 51 items
APPLIER CLIP LOGIC TI 5 (MISCELLANEOUS) IMPLANT
APPLIER CLIP ROT 10 11.4 M/L (STAPLE)
BINDER ABDOMINAL 12 ML 46-62 (SOFTGOODS) ×2 IMPLANT
BNDG GAUZE ELAST 4 BULKY (GAUZE/BANDAGES/DRESSINGS) IMPLANT
CANISTER SUCTION 2500CC (MISCELLANEOUS) IMPLANT
CHLORAPREP W/TINT 26ML (MISCELLANEOUS) ×2 IMPLANT
CLIP APPLIE ROT 10 11.4 M/L (STAPLE) IMPLANT
COVER SURGICAL LIGHT HANDLE (MISCELLANEOUS) ×2 IMPLANT
DERMABOND ADVANCED (GAUZE/BANDAGES/DRESSINGS) ×1
DERMABOND ADVANCED .7 DNX12 (GAUZE/BANDAGES/DRESSINGS) ×1 IMPLANT
DEVICE SECURE STRAP 25 ABSORB (INSTRUMENTS) ×2 IMPLANT
DEVICE TROCAR PUNCTURE CLOSURE (ENDOMECHANICALS) ×2 IMPLANT
DRAPE INCISE IOBAN 66X45 STRL (DRAPES) ×2 IMPLANT
DRAPE LAPAROSCOPIC ABDOMINAL (DRAPES) ×2 IMPLANT
ELECT CAUTERY BLADE 6.4 (BLADE) ×2 IMPLANT
ELECT REM PT RETURN 9FT ADLT (ELECTROSURGICAL) ×2
ELECTRODE REM PT RTRN 9FT ADLT (ELECTROSURGICAL) ×1 IMPLANT
GLOVE BIO SURGEON STRL SZ7.5 (GLOVE) ×2 IMPLANT
GLOVE BIOGEL PI IND STRL 7.0 (GLOVE) ×3 IMPLANT
GLOVE BIOGEL PI IND STRL 7.5 (GLOVE) ×2 IMPLANT
GLOVE BIOGEL PI INDICATOR 7.0 (GLOVE) ×3
GLOVE BIOGEL PI INDICATOR 7.5 (GLOVE) ×2
GLOVE ECLIPSE 7.5 STRL STRAW (GLOVE) ×2 IMPLANT
GLOVE SURG SS PI 7.0 STRL IVOR (GLOVE) ×2 IMPLANT
GLOVE SURG SS PI 7.5 STRL IVOR (GLOVE) ×2 IMPLANT
GOWN STRL REUS W/ TWL LRG LVL3 (GOWN DISPOSABLE) ×3 IMPLANT
GOWN STRL REUS W/TWL LRG LVL3 (GOWN DISPOSABLE) ×3
KIT BASIN OR (CUSTOM PROCEDURE TRAY) ×2 IMPLANT
KIT ROOM TURNOVER OR (KITS) ×2 IMPLANT
MARKER SKIN DUAL TIP RULER LAB (MISCELLANEOUS) ×2 IMPLANT
MESH VENTRALIGHT ST 4.5IN (Mesh General) ×2 IMPLANT
NEEDLE SPNL 22GX3.5 QUINCKE BK (NEEDLE) ×2 IMPLANT
NS IRRIG 1000ML POUR BTL (IV SOLUTION) ×2 IMPLANT
PAD ARMBOARD 7.5X6 YLW CONV (MISCELLANEOUS) ×4 IMPLANT
PENCIL BUTTON HOLSTER BLD 10FT (ELECTRODE) ×2 IMPLANT
SCALPEL HARMONIC ACE (MISCELLANEOUS) IMPLANT
SCISSORS LAP 5X35 DISP (ENDOMECHANICALS) IMPLANT
SET IRRIG TUBING LAPAROSCOPIC (IRRIGATION / IRRIGATOR) IMPLANT
SLEEVE ENDOPATH XCEL 5M (ENDOMECHANICALS) ×2 IMPLANT
SUT MNCRL AB 4-0 PS2 18 (SUTURE) ×4 IMPLANT
SUT NOVA NAB DX-16 0-1 5-0 T12 (SUTURE) ×2 IMPLANT
SUT VIC AB 3-0 SH 27 (SUTURE) ×1
SUT VIC AB 3-0 SH 27XBRD (SUTURE) ×1 IMPLANT
TOWEL OR 17X24 6PK STRL BLUE (TOWEL DISPOSABLE) ×2 IMPLANT
TOWEL OR 17X26 10 PK STRL BLUE (TOWEL DISPOSABLE) ×2 IMPLANT
TRAY FOLEY CATH 16FR SILVER (SET/KITS/TRAYS/PACK) ×2 IMPLANT
TRAY LAPAROSCOPIC MC (CUSTOM PROCEDURE TRAY) ×2 IMPLANT
TROCAR XCEL BLUNT TIP 100MML (ENDOMECHANICALS) IMPLANT
TROCAR XCEL NON-BLD 11X100MML (ENDOMECHANICALS) IMPLANT
TROCAR XCEL NON-BLD 5MMX100MML (ENDOMECHANICALS) ×2 IMPLANT
TUBING INSUFFLATION (TUBING) ×2 IMPLANT

## 2016-06-15 NOTE — H&P (Signed)
Rod Can  Location: [facility] Patient #: [id number] DOB: 1960/09/18 Married / Language: English / Race: Black or African American Female   History of Present Illness  The patient is a 55 year old female who presents with abdominal pain. We are asked to see the patient in consultation by Dr. Kelton Pillar to evaluate her for a ventral hernia. The patient is a 55 year old female who began having pain just above the bellybutton about 2 days ago. She has had a recent upper respiratory infection with significant coughing. She denies any fevers or chills. She denies any nausea or vomiting. 2 days ago when she began having the pain she could also feel a small amount just above the umbilicus. She brought this to her doctor's attention and a CT scan was obtained. The scan shows a small ventral hernia just at the upper edge of the umbilicus with some omentum that is likely incarcerated. There is no sign of obstruction.   Allergies  No Known Drug Allergies  Medication History  Albuterol (90MCG/ACT Aerosol Soln, Inhalation as needed) Active. Enbrel SureClick (50MG /ML Soln Auto-inj, Subcutaneous) Active. Vitamin D (Ergocalciferol) (50000UNIT Capsule, Oral) Active. Fish Oil (1000MG  Capsule, Oral) Active. Methotrexate (2.5MG  Tablet, Oral once a week) Active. Omega 3 (1000MG  Capsule, Oral daily) Active. MetFORMIN HCl (500MG  Tablet, Oral daily) Active. Diclofenac Sodium (2% Solution, Transdermal As needed) Active. TraMADol HCl (50MG  Tablet, Oral as needed) Active. Venlafaxine HCl ER (75MG  Capsule ER 24HR, Oral daily) Active. Medications Reconciled    Review of Systems  General Not Present- Appetite Loss, Chills, Fatigue, Fever, Night Sweats, Weight Gain and Weight Loss. Skin Not Present- Change in Wart/Mole, Dryness, Hives, Jaundice, New Lesions, Non-Healing Wounds, Rash and Ulcer. HEENT Not Present- Earache, Hearing Loss, Hoarseness, Nose Bleed, Oral Ulcers,  Ringing in the Ears, Seasonal Allergies, Sinus Pain, Sore Throat, Visual Disturbances, Wears glasses/contact lenses and Yellow Eyes. Respiratory Not Present- Bloody sputum, Chronic Cough, Difficulty Breathing, Snoring and Wheezing. Breast Not Present- Breast Mass, Breast Pain, Nipple Discharge and Skin Changes. Cardiovascular Not Present- Chest Pain, Difficulty Breathing Lying Down, Leg Cramps, Palpitations, Rapid Heart Rate, Shortness of Breath and Swelling of Extremities. Gastrointestinal Present- Abdominal Pain. Not Present- Bloating, Bloody Stool, Change in Bowel Habits, Chronic diarrhea, Constipation, Difficulty Swallowing, Excessive gas, Gets full quickly at meals, Hemorrhoids, Indigestion, Nausea, Rectal Pain and Vomiting. Female Genitourinary Not Present- Frequency, Nocturia, Painful Urination, Pelvic Pain and Urgency. Musculoskeletal Not Present- Back Pain, Joint Pain, Joint Stiffness, Muscle Pain, Muscle Weakness and Swelling of Extremities. Neurological Not Present- Decreased Memory, Fainting, Headaches, Numbness, Seizures, Tingling, Tremor, Trouble walking and Weakness. Psychiatric Not Present- Anxiety, Bipolar, Change in Sleep Pattern, Depression, Fearful and Frequent crying. Endocrine Not Present- Cold Intolerance, Excessive Hunger, Hair Changes, Heat Intolerance, Hot flashes and New Diabetes. Hematology Not Present- Easy Bruising, Excessive bleeding, Gland problems, HIV and Persistent Infections.  Vitals  Weight: 234 lb Height: 63.5in Body Surface Area: 2.08 m Body Mass Index: 40.8 kg/m  Temp.: 98.25F  Pulse: 89 (Regular)  BP: 118/62 (Sitting, Left Arm, Standard)       Physical Exam  General Mental Status-Alert. General Appearance-Consistent with stated age. Hydration-Well hydrated. Voice-Normal.  Head and Neck Head-normocephalic, atraumatic with no lesions or palpable masses. Trachea-midline. Thyroid Gland Characteristics - normal size and  consistency.  Eye Eyeball - Bilateral-Extraocular movements intact. Sclera/Conjunctiva - Bilateral-No scleral icterus.  Chest and Lung Exam Chest and lung exam reveals -quiet, even and easy respiratory effort with no use of accessory muscles and on auscultation, normal  breath sounds, no adventitious sounds and normal vocal resonance. Inspection Chest Wall - Normal. Back - normal.  Cardiovascular Cardiovascular examination reveals -normal heart sounds, regular rate and rhythm with no murmurs and normal pedal pulses bilaterally.  Abdomen Note: The abdomen is soft and nondistended. There is moderate focal tenderness just above the umbilicus. She has a well-healed lower midline scar as well as a supraumbilical scar. There is minimal redness to the skin.   Neurologic Neurologic evaluation reveals -alert and oriented x 3 with no impairment of recent or remote memory. Mental Status-Normal.  Musculoskeletal Normal Exam - Left-Upper Extremity Strength Normal and Lower Extremity Strength Normal. Normal Exam - Right-Upper Extremity Strength Normal and Lower Extremity Strength Normal.  Lymphatic Head & Neck  General Head & Neck Lymphatics: Bilateral - Description - Normal. Axillary  General Axillary Region: Bilateral - Description - Normal. Tenderness - Non Tender. Femoral & Inguinal  Generalized Femoral & Inguinal Lymphatics: Bilateral - Description - Normal. Tenderness - Non Tender.    Assessment & Plan VENTRAL HERNIA WITHOUT OBSTRUCTION OR GANGRENE (K43.9) Impression: The patient appears to have a small ventral hernia with some incarcerated omentum. There is no sign of obstruction. At this point I would recommend that she consider having her hernia fixed. She would also like to have this done. I have discussed with her in detail the risks and benefits of the operation to fix the hernia as well as some of the technical aspects and she understands and wishes to proceed.  For a laparoscopic assisted ventral hernia repair with mesh. If she develops worsening pain than she knows to go to the emergency department Current Plans Pt Education - Hernia: discussed with patient and provided information.

## 2016-06-15 NOTE — Interval H&P Note (Signed)
History and Physical Interval Note:  06/15/2016 9:40 AM  Amuthanjalee Lawson Fiscal  has presented today for surgery, with the diagnosis of VENTRAL HERNIA  The various methods of treatment have been discussed with the patient and family. After consideration of risks, benefits and other options for treatment, the patient has consented to  Procedure(s): LAPAROSCOPIC VENTRAL HERNIA WITH MESH (N/A) INSERTION OF MESH (N/A) as a surgical intervention .  The patient's history has been reviewed, patient examined, no change in status, stable for surgery.  I have reviewed the patient's chart and labs.  Questions were answered to the patient's satisfaction.     TOTH III,PAUL S

## 2016-06-15 NOTE — Anesthesia Postprocedure Evaluation (Signed)
Anesthesia Post Note  Patient: Amuthanjalee S Thul  Procedure(s) Performed: Procedure(s) (LRB): LAPAROSCOPIC VENTRAL HERNIA WITH MESH (N/A) INSERTION OF MESH (N/A)  Patient location during evaluation: PACU Anesthesia Type: General Level of consciousness: awake and alert Pain management: pain level controlled Vital Signs Assessment: post-procedure vital signs reviewed and stable Respiratory status: spontaneous breathing, nonlabored ventilation, respiratory function stable and patient connected to nasal cannula oxygen Cardiovascular status: blood pressure returned to baseline and stable Postop Assessment: no signs of nausea or vomiting Anesthetic complications: no    Last Vitals:  Vitals:   06/15/16 1245 06/15/16 1307  BP:  123/66  Pulse: 66 71  Resp: 17   Temp:  36.6 C    Last Pain:  Vitals:   06/15/16 1307  TempSrc: Oral  PainSc:                  Hermilo Dutter S

## 2016-06-15 NOTE — Op Note (Signed)
06/15/2016  11:39 AM  PATIENT:  Natasha Lyons  55 y.o. female  PRE-OPERATIVE DIAGNOSIS:  VENTRAL HERNIA  POST-OPERATIVE DIAGNOSIS:  VENTRAL HERNIA  PROCEDURE:  Procedure(s): LAPAROSCOPIC VENTRAL HERNIA WITH MESH (N/A) INSERTION OF MESH (N/A)  SURGEON:  Surgeon(s) and Role:    * Jovita Kussmaul, MD - Primary  PHYSICIAN ASSISTANT:   ASSISTANTS: Judyann Munson, RNFA   ANESTHESIA:   general  EBL:  Total I/O In: 1500 [I.V.:1500] Out: -   BLOOD ADMINISTERED:none  DRAINS: none   LOCAL MEDICATIONS USED:  MARCAINE     SPECIMEN:  No Specimen  DISPOSITION OF SPECIMEN:  N/A  COUNTS:  YES  TOURNIQUET:  * No tourniquets in log *  DICTATION: .Dragon Dictation   After informed consent was obtained the patient was brought to the operating room and placed in the supine position on the operating room table. After adequate induction of general anesthesia the patient's abdomen was prepped with ChloraPrep, allowed to dry, and draped in usual sterile manner including the use of an Ioban drape. An appropriate timeout was performed. A site was chosen in the left upper quadrant to access the abdominal cavity. This area was infiltrated with quarter percent Marcaine. A small stab incision was made with a 15 blade knife. A 5 mm Optiview port and camera were used to bluntly dissect to the layers of the abdominal wall under direct vision until access was gained to the abdominal cavity. The abdomen was then insufflated with carbon dioxide without difficulty. The abdomen was done generally inspected. The only abnormality noted was a small ventral hernia with some incarcerated omentum. This was located at the umbilicus. Another 5 mm port was placed in the left lower quadrant under direct vision without difficulty. Using sharp dissection with the laparoscopic scissors the adhesions of omentum to the hernia were taken down. Hemostasis was achieved using the Bovie electrocautery. Once the hernia was  reduced the omentum was inspected and found to be hemostatic. The fascial defect was very small at the umbilicus. At this point a 11 cm piece of circular ventral light mesh was chosen. 4 #1 Novafil stitches were placed at equidistant points around the edge of the mesh and the mesh was oriented so that the coated side was down towards the bowel. Next a small incision was made at the upper edge of the umbilicus with a 15 blade knife. The incision was carried through the skin and subcutaneous tissue sharply with the electrocautery until the hernia was entered. The hernia sac was excised sharply with the electrocautery. Next the mesh was moistened with saline and placed through the incision into the abdominal cavity. The fascial defect was closed with a #1 Novafil stitch. 4 small stab incisions were then made at points corresponding to the 4 stitches at the edge of the mesh. A suture passer was placed through each of these incisions then used to bring the appropriate tails of the suture through the abdominal wall. Once this was accomplished each of these stitches was cinched down and tied. The mesh was observed to be in good apposition to the anterior abdominal wall in appropriate orientation with no redundancy or gaps. The spaces between the stitches were filled in with a secure strap of absorbable tacker. At this point the mesh was in very good apposition to the abdominal wall with no gaps or redundancy. The area was examined and found to be hemostatic. At this point the gas was allowed to escape and the mesh was observed  to remain in good position. Next the skin incisions were closed with interrupted 4-0 Monocryl subcuticular stitches. Dermabond dressings were applied. Patient tolerated the procedure well. At the end of the case all needle sponge counts were correct. The patient was then awakened and taken to recovery in stable condition. Sterile dressings and an abdominal binder was also applied.  PLAN OF CARE:  Admit for overnight observation  PATIENT DISPOSITION:  PACU - hemodynamically stable.   Delay start of Pharmacological VTE agent (>24hrs) due to surgical blood loss or risk of bleeding: no

## 2016-06-15 NOTE — Anesthesia Preprocedure Evaluation (Addendum)
Anesthesia Evaluation  Patient identified by MRN, date of birth, ID band Patient awake    Reviewed: Allergy & Precautions, NPO status , Patient's Chart, lab work & pertinent test results  Airway Mallampati: II  TM Distance: <3 FB Neck ROM: Full    Dental no notable dental hx. (+) Teeth Intact, Dental Advisory Given   Pulmonary asthma ,    Pulmonary exam normal breath sounds clear to auscultation       Cardiovascular hypertension, Pt. on medications Normal cardiovascular exam Rhythm:Regular Rate:Normal     Neuro/Psych negative neurological ROS  negative psych ROS   GI/Hepatic negative GI ROS, Neg liver ROS,   Endo/Other  negative endocrine ROSMorbid obesity  Renal/GU negative Renal ROS  negative genitourinary   Musculoskeletal negative musculoskeletal ROS (+)   Abdominal   Peds negative pediatric ROS (+)  Hematology negative hematology ROS (+)   Anesthesia Other Findings   Reproductive/Obstetrics negative OB ROS                            Anesthesia Physical Anesthesia Plan  ASA: II  Anesthesia Plan: General   Post-op Pain Management:    Induction: Intravenous  Airway Management Planned: Oral ETT  Additional Equipment:   Intra-op Plan:   Post-operative Plan: Extubation in OR  Informed Consent: I have reviewed the patients History and Physical, chart, labs and discussed the procedure including the risks, benefits and alternatives for the proposed anesthesia with the patient or authorized representative who has indicated his/her understanding and acceptance.   Dental advisory given  Plan Discussed with: CRNA and Surgeon  Anesthesia Plan Comments:         Anesthesia Quick Evaluation

## 2016-06-15 NOTE — Anesthesia Procedure Notes (Signed)
Procedure Name: Intubation Date/Time: 06/15/2016 10:07 AM Performed by: Luciana Axe K Pre-anesthesia Checklist: Patient identified, Emergency Drugs available, Suction available and Patient being monitored Patient Re-evaluated:Patient Re-evaluated prior to inductionOxygen Delivery Method: Circle System Utilized Preoxygenation: Pre-oxygenation with 100% oxygen Intubation Type: IV induction Ventilation: Mask ventilation without difficulty and Oral airway inserted - appropriate to patient size Laryngoscope Size: Miller and 2 Grade View: Grade I Tube type: Oral Tube size: 7.0 mm Number of attempts: 1 Airway Equipment and Method: Stylet and Oral airway Placement Confirmation: ETT inserted through vocal cords under direct vision,  positive ETCO2 and breath sounds checked- equal and bilateral Secured at: 20 cm Tube secured with: Tape Dental Injury: Teeth and Oropharynx as per pre-operative assessment

## 2016-06-15 NOTE — Transfer of Care (Signed)
Immediate Anesthesia Transfer of Care Note  Patient: Natasha Lyons  Procedure(s) Performed: Procedure(s): LAPAROSCOPIC VENTRAL HERNIA WITH MESH (N/A) INSERTION OF MESH (N/A)  Patient Location: PACU  Anesthesia Type:General  Level of Consciousness: awake, oriented and patient cooperative  Airway & Oxygen Therapy: Patient Spontanous Breathing and Patient connected to face mask oxygen  Post-op Assessment: Report given to RN and Post -op Vital signs reviewed and stable  Post vital signs: Reviewed and stable  Last Vitals:  Vitals:   06/15/16 0831 06/15/16 0832  BP: (!) 145/86   Pulse:  64  Resp: 20   Temp: 36.7 C     Last Pain:  Vitals:   06/15/16 0843  PainSc: 0-No pain      Patients Stated Pain Goal: 6 ([id number])  Complications: No apparent anesthesia complications

## 2016-06-16 ENCOUNTER — Encounter (HOSPITAL_COMMUNITY): Payer: Self-pay | Admitting: General Surgery

## 2016-06-16 DIAGNOSIS — K436 Other and unspecified ventral hernia with obstruction, without gangrene: Secondary | ICD-10-CM | POA: Diagnosis not present

## 2016-06-16 MED ORDER — PANTOPRAZOLE SODIUM 40 MG PO TBEC
40.0000 mg | DELAYED_RELEASE_TABLET | Freq: Every day | ORAL | Status: DC
  Administered 2016-06-16: 40 mg via ORAL
  Filled 2016-06-16: qty 1

## 2016-06-16 NOTE — Progress Notes (Signed)
1 Day Post-Op  Subjective: Complains of pain and still needs IV pain meds  Objective: Vital signs in last 24 hours: Temp:  [97.8 F (36.6 C)-98.5 F (36.9 C)] 98.5 F (36.9 C) (12/08 0634) Pulse Rate:  [66-89] 80 (12/08 0634) Resp:  [16-20] 18 (12/08 0634) BP: (108-140)/(47-92) 108/47 (12/08 0634) SpO2:  [88 %-100 %] 95 % (12/08 0634) Last BM Date: 06/14/16  Intake/Output from previous day: 12/07 0701 - 12/08 0700 In: 2742.5 [I.V.:2742.5] Out: 1550 [Urine:1500; Blood:50] Intake/Output this shift: No intake/output data recorded.  Resp: clear to auscultation bilaterally Cardio: regular rate and rhythm GI: soft, tender near incisions  Lab Results:  No results for input(s): WBC, HGB, HCT, PLT in the last 72 hours. BMET No results for input(s): NA, K, CL, CO2, GLUCOSE, BUN, CREATININE, CALCIUM in the last 72 hours. PT/INR No results for input(s): LABPROT, INR in the last 72 hours. ABG No results for input(s): PHART, HCO3 in the last 72 hours.  Invalid input(s): PCO2, PO2  Studies/Results: No results found.  Anti-infectives: Anti-infectives    Start     Dose/Rate Route Frequency Ordered Stop   06/15/16 0821  ceFAZolin (ANCEF) IVPB 2g/100 mL premix     2 g 200 mL/hr over 30 Minutes Intravenous On call to O.R. 06/15/16 0821 06/15/16 1011      Assessment/Plan: s/p Procedure(s): LAPAROSCOPIC VENTRAL HERNIA WITH MESH (N/A) INSERTION OF MESH (N/A) Advance diet  Continue to work on pain control. Ambulate Not ready for discharge yet  LOS: 0 days    TOTH III,PAUL S 06/16/2016

## 2016-06-17 DIAGNOSIS — K436 Other and unspecified ventral hernia with obstruction, without gangrene: Secondary | ICD-10-CM | POA: Diagnosis not present

## 2016-06-17 MED ORDER — HYDROCODONE-ACETAMINOPHEN 5-325 MG PO TABS: 1.0000 | ORAL_TABLET | ORAL | 0 refills | Status: DC | PRN

## 2016-06-17 NOTE — Progress Notes (Signed)
2 Days Post-Op  Subjective: Feeling better  Objective: Vital signs in last 24 hours: Temp:  [97.9 F (36.6 C)-98.6 F (37 C)] 97.9 F (36.6 C) (12/09 0555) Pulse Rate:  [65-79] 65 (12/09 0555) Resp:  [18] 18 (12/09 0555) BP: (116-134)/(48-65) 116/55 (12/09 0555) SpO2:  [93 %-96 %] 95 % (12/09 0555) Weight:  [106.6 kg (235 lb)] 106.6 kg (235 lb) (12/09 0700) Last BM Date: 06/16/16  Intake/Output from previous day: 12/08 0701 - 12/09 0700 In: 1420 [P.O.:1420] Out: [id number] [Urine:1650] Intake/Output this shift: No intake/output data recorded.  Exam: Comfortable in appearance Abdomen soft, binder in place  Lab Results:  No results for input(s): WBC, HGB, HCT, PLT in the last 72 hours. BMET No results for input(s): NA, K, CL, CO2, GLUCOSE, BUN, CREATININE, CALCIUM in the last 72 hours. PT/INR No results for input(s): LABPROT, INR in the last 72 hours. ABG No results for input(s): PHART, HCO3 in the last 72 hours.  Invalid input(s): PCO2, PO2  Studies/Results: No results found.  Anti-infectives: Anti-infectives    Start     Dose/Rate Route Frequency Ordered Stop   06/15/16 0821  ceFAZolin (ANCEF) IVPB 2g/100 mL premix     2 g 200 mL/hr over 30 Minutes Intravenous On call to O.R. 06/15/16 0821 06/15/16 1011      Assessment/Plan: s/p Procedure(s): LAPAROSCOPIC VENTRAL HERNIA WITH MESH (N/A) INSERTION OF MESH (N/A)  Improved.  Wants to go home which is reasonable Will d/c  LOS: 0 days    Irish Piech A 06/17/2016

## 2016-06-17 NOTE — Discharge Summary (Signed)
Physician Discharge Summary  Patient ID: Natasha Lyons MRN: [id number] DOB/AGE: 53-Dec-1962 53 y.o.  Admit date: 06/15/2016 Discharge date: 06/17/2016  Admission Diagnoses:  Discharge Diagnoses:  Active Problems:   Ventral hernia without obstruction or gangrene   Discharged Condition: good  Hospital Course: uneventful post op recovery.  Discharged home pOD#2  Consults: None  Significant Diagnostic Studies:   Treatments: surgery: lap ventral hernia repair with mesh  Discharge Exam: Blood pressure (!) 116/55, pulse 65, temperature 97.9 F (36.6 C), temperature source Oral, resp. rate 18, height 5\' 4"  (1.626 m), weight 106.6 kg (235 lb), last menstrual period 04/02/2016, SpO2 95 %. General appearance: alert, cooperative and no distress Resp: clear to auscultation bilaterally Cardio: regular rate and rhythm, S1, S2 normal, no murmur, click, rub or gallop Incision/Wound:abdomen soft  Disposition: 01-Home or Self Care     Medication List    TAKE these medications   amLODipine 5 MG tablet Commonly known as:  NORVASC Take 5 mg by mouth daily.   BAYER CONTOUR NEXT TEST test strip Generic drug:  glucose blood USE AS INSTRUCTED TO CHECK BLOOD SUGARS ONCE A DAY   BIOFREEZE ROLL-ON COLORLESS 4 % Gel Generic drug:  Menthol (Topical Analgesic) Apply 1-2 application topically 4 (four) times daily as needed (for pain.).   cetirizine 10 MG tablet Commonly known as:  ZYRTEC Take 10 mg by mouth daily.   Cinnamon 500 MG Tabs Take 500 mg by mouth daily.   COSENTYX 300 DOSE 150 MG/ML Sosy Generic drug:  Secukinumab Inject 300 mg into the skin every 30 (thirty) days.   dextromethorphan 30 MG/5ML liquid Commonly known as:  DELSYM Take 30 mg by mouth 3 (three) times daily as needed for cough.   Diclofenac Sodium 2 % Soln Commonly known as:  PENNSAID Place 2 application onto the skin 2 (two) times daily.   Fish Oil 1000 MG Caps Take 1,000 mg by mouth daily.   folic  acid [id number] MCG tablet Commonly known as:  FOLVITE Take 800-1,600 mcg by mouth as directed. 1600 mcg on Sundays and 800 mcg on all other days.   gabapentin 100 MG capsule Commonly known as:  NEURONTIN Take 2 capsules (200 mg total) by mouth at bedtime.   HYDROcodone-acetaminophen 5-325 MG tablet Commonly known as:  NORCO/VICODIN Take 1-2 tablets by mouth every 4 (four) hours as needed for moderate pain.   metFORMIN 500 MG 24 hr tablet Commonly known as:  GLUCOPHAGE-XR TAKE 2 TABLETS (1,000 MG TOTAL) BY MOUTH DAILY WITH SUPPER.   methocarbamol 500 MG tablet Commonly known as:  ROBAXIN Take 500 mg by mouth 4 (four) times daily as needed for muscle spasms.   methotrexate 2.5 MG tablet Commonly known as:  RHEUMATREX Take 10 mg by mouth every Sunday. Caution:Chemotherapy. Protect from light.   multivitamin with minerals Tabs tablet Take 1 tablet by mouth daily.   polyvinyl alcohol 1.4 % ophthalmic solution Commonly known as:  LIQUIFILM TEARS Place 1-2 drops into both eyes 3 (three) times daily as needed for dry eyes.   albuterol (2.5 MG/3ML) 0.083% nebulizer solution Commonly known as:  PROVENTIL Take 2.5 mg by nebulization every 6 (six) hours as needed for wheezing or shortness of breath.   PROVENTIL HFA 108 (90 Base) MCG/ACT inhaler Generic drug:  albuterol Inhale 2 puffs into the lungs every 6 (six) hours as needed for wheezing.   traMADol 50 MG tablet Commonly known as:  ULTRAM Take 1 tablet (50 mg total) by mouth every 6 (six)  hours as needed. What changed:  reasons to take this   Turmeric 500 MG Tabs Take 500 mg by mouth daily.   venlafaxine XR 75 MG 24 hr capsule Commonly known as:  EFFEXOR-XR TAKE 1 CAPSULE (75 MG TOTAL) BY MOUTH DAILY What changed:  how much to take  how to take this  when to take this  additional instructions   venlafaxine XR 150 MG 24 hr capsule Commonly known as:  EFFEXOR-XR TAKE ONE CAPSULE BY MOUTH EVERY DAY WITH BREAKFAST What  changed:  Another medication with the same name was changed. Make sure you understand how and when to take each.   Vitamin D (Ergocalciferol) 50000 units Caps capsule Commonly known as:  DRISDOL Take 1 capsule (50,000 Units total) by mouth every 7 (seven) days. What changed:  when to take this      Follow-up Information    TOTH III,PAUL S, MD. Schedule an appointment as soon as possible for a visit in 3 week(s).   Specialty:  General Surgery Contact information: [address] [place] [address] [phone]           Signed: Harl Bowie 06/17/2016, 7:45 AM

## 2016-06-17 NOTE — Discharge Planning (Signed)
AVS reviewed and given to patient who verbalizes understanding. DC'D to private car with all personal belongings at 1135, accompanied by family.

## 2016-06-17 NOTE — Discharge Instructions (Signed)
[facility] _______[facility] ° °UMBILICAL OR INGUINAL HERNIA REPAIR: POST OP INSTRUCTIONS ° °Always review your discharge instruction sheet given to you by the facility where your surgery was performed. °IF YOU HAVE DISABILITY OR FAMILY LEAVE FORMS, YOU MUST BRING THEM TO THE OFFICE FOR PROCESSING.   °DO NOT GIVE THEM TO YOUR DOCTOR. ° °1. A  prescription for pain medication may be given to you upon discharge.  Take your pain medication as prescribed, if needed.  If narcotic pain medicine is not needed, then you may take acetaminophen (Tylenol) or ibuprofen (Advil) as needed. °2. Take your usually prescribed medications unless otherwise directed. °If you need a refill on your pain medication, please contact your pharmacy.  They will contact our office to request authorization. Prescriptions will not be filled after 5 pm or on week-ends. °3. You should follow a light diet the first 24 hours after arrival home, such as soup and crackers, etc.  Be sure to include lots of fluids daily.  Resume your normal diet the day after surgery. °4.Most patients will experience some swelling and bruising around the umbilicus or in the groin and scrotum.  Ice packs and reclining will help.  Swelling and bruising can take several days to resolve.  °6. It is common to experience some constipation if taking pain medication after surgery.  Increasing fluid intake and taking a stool softener (such as Colace) will usually help or prevent this problem from occurring.  A mild laxative (Milk of Magnesia or Miralax) should be taken according to package directions if there are no bowel movements after 48 hours. °7. Unless discharge instructions indicate otherwise, you may remove your bandages 24-48 hours after surgery, and you may shower at that time.  You may have steri-strips (small skin tapes) in place directly over the incision.  These strips should be left on the skin for 7-10 days.  If your surgeon used skin glue on the  incision, you may shower in 24 hours.  The glue will flake off over the next 2-3 weeks.  Any sutures or staples will be removed at the office during your follow-up visit. °8. ACTIVITIES:  You may resume regular (light) daily activities beginning the next day--such as daily self-care, walking, climbing stairs--gradually increasing activities as tolerated.  You may have sexual intercourse when it is comfortable.  Refrain from any heavy lifting or straining until approved by your doctor. ° °a.You may drive when you are no longer taking prescription pain medication, you can comfortably wear a seatbelt, and you can safely maneuver your car and apply brakes. °b.RETURN TO WORK:   °_____________________________________________ ° °9.You should see your doctor in the office for a follow-up appointment approximately 2-3 weeks after your surgery.  Make sure that you call for this appointment within a day or two after you arrive home to insure a convenient appointment time. °10.OTHER INSTRUCTIONS: _________________________ °   _____________________________________ ° °WHEN TO CALL YOUR DOCTOR: °1. Fever over 101.0 °2. Inability to urinate °3. Nausea and/or vomiting °4. Extreme swelling or bruising °5. Continued bleeding from incision. °6. Increased pain, redness, or drainage from the incision ° °The clinic staff is available to answer your questions during regular business hours.  Please don’t hesitate to call and ask to speak to one of the nurses for clinical concerns.  If you have a medical emergency, go to the nearest emergency room or call 911.  A surgeon from [facility] is always on call at the hospital ° ° °  [address], [address], [place], [place]  [address] ? ° [address] [address], [place], [place]   [address] °[phone] ? [phone] ? FAX [phone] °Web site: [email or web address] °

## 2016-09-14 ENCOUNTER — Ambulatory Visit
Admission: RE | Admit: 2016-09-14 | Discharge: 2016-09-14 | Disposition: A | Discharge status: Home / Self Care | Payer: BC Managed Care – PPO | Source: Ambulatory Visit | Attending: Family Medicine | Admitting: Family Medicine

## 2016-09-14 DIAGNOSIS — M5416 Radiculopathy, lumbar region: Secondary | ICD-10-CM

## 2016-09-14 MED ORDER — METHYLPREDNISOLONE ACETATE 40 MG/ML INJ SUSP (RADIOLOG
120.0000 mg | Freq: Once | INTRAMUSCULAR | Status: AC
  Administered 2016-09-14: 120 mg via EPIDURAL

## 2016-09-14 MED ORDER — IOPAMIDOL (ISOVUE-M 200) INJECTION 41%
1.0000 mL | Freq: Once | INTRAMUSCULAR | Status: AC
  Administered 2016-09-14: 1 mL via EPIDURAL

## 2016-09-14 NOTE — Discharge Instructions (Signed)

## 2016-11-25 ENCOUNTER — Other Ambulatory Visit: Payer: Self-pay | Admitting: Family Medicine

## 2016-11-27 NOTE — Telephone Encounter (Signed)
Refill done.  

## 2017-01-03 ENCOUNTER — Ambulatory Visit (INDEPENDENT_AMBULATORY_CARE_PROVIDER_SITE_OTHER): Payer: BC Managed Care – PPO | Admitting: Allergy & Immunology

## 2017-01-03 ENCOUNTER — Encounter: Payer: Self-pay | Admitting: Allergy & Immunology

## 2017-01-03 VITALS — BP 120/78 | HR 92 | Temp 97.7°F | Resp 20 | Ht 64.5 in | Wt 243.2 lb

## 2017-01-03 DIAGNOSIS — J3089 Other allergic rhinitis: Secondary | ICD-10-CM | POA: Diagnosis not present

## 2017-01-03 DIAGNOSIS — K219 Gastro-esophageal reflux disease without esophagitis: Secondary | ICD-10-CM

## 2017-01-03 DIAGNOSIS — J4550 Severe persistent asthma, uncomplicated: Secondary | ICD-10-CM

## 2017-01-03 MED ORDER — AZELASTINE-FLUTICASONE 137-50 MCG/ACT NA SUSP: NASAL | 5 refills | Status: DC

## 2017-01-03 MED ORDER — METHYLPREDNISOLONE ACETATE 80 MG/ML IJ SUSP
80.0000 mg | Freq: Once | INTRAMUSCULAR | Status: AC
  Administered 2017-01-03: 80 mg via INTRAMUSCULAR

## 2017-01-03 MED ORDER — LEVOCETIRIZINE DIHYDROCHLORIDE 5 MG PO TABS: 5.0000 mg | ORAL_TABLET | Freq: Every evening | ORAL | 5 refills | Status: DC

## 2017-01-03 NOTE — Progress Notes (Signed)
NEW PATIENT  Date of Service/Encounter:  01/03/17  Referring provider: Shamleffer, Herschell Dimes, MD   Assessment:   Severe persistent asthma, uncomplicated  Perennial allergic rhinitis (grasses, molds, dust mites, cat and cockroach)  Gastroesophageal reflux disease    Asthma Reportables:  Severity: severe persistent  Risk: high due to poor insight into symptoms Control: very poorly controlled   Plan/Recommendations:   1. Severe persistent asthma, uncomplicated - Lung testing was terrible today, but it did improve with albuterol nebulizer use. - Stop the Advair and start Breo 200/25 one puff once daily. - Steroid injection given today.  - Daily controller medication(s): Breo 200/25 one puff once daily - Rescue medications: albuterol 4 puffs every 4-6 hours as needed - Asthma control goals:  * Full participation in all desired activities (may need albuterol before activity) * Albuterol use two time or less a week on average (not counting use with activity) * Cough interfering with sleep two time or less a month * Oral steroids no more than once a year * No hospitalizations  2. Perennial allergic rhinitis - Testing today showed: grasses, molds, dust mites, cat and cockroach - Avoidance measures provided. - Stop the Zyrtec and the Flonase.  - Start Xyzal (levocetirizine) 5mg  tablet once daily and Dymista (fluticasone/azelastine) two sprays per nostril 1-2 times daily as needed - You can use an extra dose of the antihistamine, if needed, for breakthrough symptoms.  - Consider allergy shots as a means of long-term control. - Allergy shots "re-train" the immune system to ignore environmental allergens and decrease the resulting immune response to those allergens.  - We can discuss more at the next appointment if the medications are not working for you.  3. Gastroesophageal reflux disease - Agree with the plan to re-establish care with the Gastroenterologist.  -  Samples provided for Dexilant 60mg  once daily.  4. Return in about 4 weeks (around 01/31/2017).   Subjective:   Amuthanjalee JANUARY BERGTHOLD is a 56 y.o. female presenting today for evaluation of  Chief Complaint  Patient presents with  . Asthma  . Allergic Rhinitis   . Shortness of Breath    Amuthanjalee S Coakley has a history of the following: Patient Active Problem List   Diagnosis Date Noted  . Ventral hernia without obstruction or gangrene 06/15/2016  . Nonallopathic lesion of cervical region 04/24/2016  . Nonallopathic lesion of rib cage 04/24/2016  . SI (sacroiliac) joint dysfunction 03/21/2016  . Nonallopathic lesion of sacral region 03/21/2016  . Nonallopathic lesion of lumbosacral region 03/21/2016  . Nonallopathic lesion of thoracic region 03/21/2016  . 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 Psoriatic arthritis ([place]) 04/07/2015  . Chronic pain syndrome 04/07/2015  . Psoriasis 08/10/2013  . Vitamin D deficiency 08/10/2013  . Thyroid nodule 05/02/2013  . Extrinsic asthma, unspecified 03/06/2013  . Obesity 03/06/2013    History obtained from: chart review and patient.  Amuthanjalee Lawson Fiscal was referred by [facility], Herschell Dimes, MD.     Cantrell is a 56 y.o. female presenting for shortness of breath. Her history is rather all over the place and she is a rather poor historian, however. She says today that she has had symptoms for years and felt that everything was under control. She has been off of her medications for 1-2 years. However, in March her symptoms flared and she became ill.   It is difficult to get a good picture of her asthma symptoms. She was restarted on Advair 250/50 one puff twice  daily in March 2018. She does feel that this helps, but it is rather difficult to ascertain how complaint she is. She does have albuterol, which again is difficult to ascertain how often she is needing this. Her symptoms have slowly improved over the course of  weeks. She continues to be short of breath, but she attributes this to exercise intolerance. She has not been on prednisone for her breathing in years, although she does have psoriatic arthritis and was recently on prednisone a few months ago to help control this. She has had no ED visits or hospitalizations for her breathing. Of note, her absolutely eosinophil count was 300 in September 2015.  Ms. Mortell endorses constant postnasal drip. She endorse constant sinus pressure. She uses Flonase 1-2 sprays per nostril daily. She was allergy tested years ago but she does not remember the results. Testing was done by a different practice. She believes that she only had allergies to dust mites at that time. She has never been on allergen immunotherapy.  She denies food allergies, but she would "like to be sure". She tolerates all of the major food allergens.   She does have a history of gastroenterologist (Dr. Collene Mares next week). She is currently on Tums for her reflux. She has never been on a PPI. She does have a history of fibromyalgia and psoriatic arthritis. She is on venlafaxine and methotrxate for these. She is followed by Dr. Kathlene November at [facility] for her psoriatic arthritis. Her PCP is Dr. . PCP is Dr. Leeroy Cha at [facility].   Otherwise, there is no history of other atopic diseases, including drug allergies, stinging insect allergies, or urticaria. There is no significant infectious history. Vaccinations are up to date.    Past Medical History: Patient Active Problem List   Diagnosis Date Noted  . Ventral hernia without obstruction or gangrene 06/15/2016  . Nonallopathic lesion of cervical region 04/24/2016  . Nonallopathic lesion of rib cage 04/24/2016  . SI (sacroiliac) joint dysfunction 03/21/2016  . Nonallopathic lesion of sacral region 03/21/2016  . Nonallopathic lesion of lumbosacral region 03/21/2016  . Nonallopathic lesion of thoracic region 03/21/2016  . Rheumatoid  bursitis, left shoulder ([place]) 08/16/2015  . Psoriatic arthritis ([place]) 04/07/2015  . Chronic pain syndrome 04/07/2015  . Psoriasis 08/10/2013  . Vitamin D deficiency 08/10/2013  . Thyroid nodule 05/02/2013  . Extrinsic asthma, unspecified 03/06/2013  . Obesity 03/06/2013    Medication List:  Allergies as of 01/03/2017   No Known Allergies     Medication List       Accurate as of 01/03/17  4:56 PM. Always use your most recent med list.          amLODipine 5 MG tablet Commonly known as:  NORVASC Take 5 mg by mouth daily.   BAYER CONTOUR NEXT TEST test strip Generic drug:  glucose blood USE AS INSTRUCTED TO CHECK BLOOD SUGARS ONCE A DAY   BIOFREEZE ROLL-ON COLORLESS 4 % Gel Generic drug:  Menthol (Topical Analgesic) Apply 1-2 application topically 4 (four) times daily as needed (for pain.).   cetirizine 10 MG tablet Commonly known as:  ZYRTEC Take 10 mg by mouth daily.   Cinnamon 500 MG Tabs Take 500 mg by mouth daily.   COSENTYX 300 DOSE 150 MG/ML Sosy Generic drug:  Secukinumab Inject 300 mg into the skin every 30 (thirty) days.   dextromethorphan 30 MG/5ML liquid Commonly known as:  DELSYM Take 30 mg by mouth 3 (three) times daily as needed  for cough.   folic acid 016 MCG tablet Commonly known as:  FOLVITE Take 800-1,600 mcg by mouth as directed. 1600 mcg on Sundays and 800 mcg on all other days.   HYDROcodone-acetaminophen 5-325 MG tablet Commonly known as:  NORCO/VICODIN Take 1-2 tablets by mouth every 4 (four) hours as needed for moderate pain.   metFORMIN 500 MG 24 hr tablet Commonly known as:  GLUCOPHAGE-XR TAKE 2 TABLETS (1,000 MG TOTAL) BY MOUTH DAILY WITH SUPPER.   methocarbamol 500 MG tablet Commonly known as:  ROBAXIN Take 500 mg by mouth 4 (four) times daily as needed for muscle spasms.   methotrexate 2.5 MG tablet Commonly known as:  RHEUMATREX Take 10 mg by mouth every Sunday. Caution:Chemotherapy. Protect from light.   multivitamin with  minerals Tabs tablet Take 1 tablet by mouth daily.   polyvinyl alcohol 1.4 % ophthalmic solution Commonly known as:  LIQUIFILM TEARS Place 1-2 drops into both eyes 3 (three) times daily as needed for dry eyes.   albuterol (2.5 MG/3ML) 0.083% nebulizer solution Commonly known as:  PROVENTIL Take 2.5 mg by nebulization every 6 (six) hours as needed for wheezing or shortness of breath.   PROVENTIL HFA 108 (90 Base) MCG/ACT inhaler Generic drug:  albuterol Inhale 2 puffs into the lungs every 6 (six) hours as needed for wheezing.   traMADol 50 MG tablet Commonly known as:  ULTRAM Take 1 tablet (50 mg total) by mouth every 6 (six) hours as needed.   Turmeric 500 MG Tabs Take 500 mg by mouth daily.   venlafaxine XR 75 MG 24 hr capsule Commonly known as:  EFFEXOR-XR TAKE 1 CAPSULE (75 MG TOTAL) BY MOUTH DAILY   Vitamin D (Ergocalciferol) 50000 units Caps capsule Commonly known as:  DRISDOL Take 1 capsule (50,000 Units total) by mouth every 7 (seven) days.       Birth History: non-contributory.   Developmental History: non-contributory.   Past Surgical History: Past Surgical History:  Procedure Laterality Date  . APPENDECTOMY    . BIOPSY THYROID  2014   results benign  . CHOLECYSTECTOMY  2008  . DIAGNOSTIC LAPAROSCOPY     d/c endometriosis  . DILATION AND CURETTAGE OF UTERUS    . INSERTION OF MESH N/A 06/15/2016   Procedure: INSERTION OF MESH;  Surgeon: Autumn Messing III, MD;  Location: [facility];  Service: General;  Laterality: N/A;  . KNEE ARTHROSCOPY Bilateral 2011  . MASS EXCISION Right 04/21/2014   Procedure: EXCISION OF RIGHT BACK MASS;  Surgeon: Coralie Keens, MD;  Location: [facility];  Service: General;  Laterality: Right;  . TONSILLECTOMY    . VENTRAL HERNIA REPAIR  06/15/2016  . VENTRAL HERNIA REPAIR N/A 06/15/2016   Procedure: LAPAROSCOPIC VENTRAL HERNIA WITH MESH;  Surgeon: Autumn Messing III, MD;  Location: [facility];  Service: General;  Laterality: N/A;      Family History: Family History  Problem Relation Age of Onset  . Diabetes Mother   . Hypertension Mother      Social History: Amuthanjalee lives at home with her husband. They have grown children who are living outside of the home. She lives in a 54 year old home. There is wound in the main living area is carpeting in the bedroom. They've electric heating and central cooling. There are dogs in the home as well as the bedroom. They do not have dust mite coverings on the bedding. There is no tobacco exposure. 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 She is not a smoker.    Review of Systems: a 14-point review of systems is pertinent for what is mentioned in HPI.  Otherwise, all other systems were negative. Constitutional: negative other than that listed in the HPI Eyes: negative other than that listed in the HPI Ears, nose, mouth, throat, and face: negative other than that listed in the HPI Respiratory: negative other than that listed in the HPI Cardiovascular: negative other than that listed in the HPI Gastrointestinal: negative other than that listed in the HPI Genitourinary: negative other than that listed in the HPI Integument: negative other than that listed in the HPI Hematologic: negative other than that listed in the HPI Musculoskeletal: negative other than that listed in the HPI Neurological: negative other than that listed in the HPI Allergy/Immunologic: negative other than that listed in the HPI    Objective:   Blood pressure 120/78, pulse 92, temperature 97.7 F (36.5 C), resp. rate 20, height 5' 4.5" (1.638 m), weight 243 lb 3.2 oz (110.3 kg), SpO2 94 %. Body mass index is 41.1 kg/m.   Physical Exam:  General: Alert, interactive, in no acute distress. Pleasant obese female.  Eyes: No conjunctival injection present on the right, No conjunctival injection present on the left, PERRL bilaterally, No discharge on the right, No  discharge on the left and No Horner-Trantas dots present Ears: Right TM pearly gray with normal light reflex, Left TM pearly gray with normal light reflex, Right TM intact without perforation and Left TM intact without perforation.  Nose/Throat: External nose within normal limits and septum midline, turbinates edematous and pale with clear discharge, post-pharynx erythematous without cobblestoning in the posterior oropharynx. Tonsils 2+ without exudates Neck: Supple without thyromegaly.  Adenopathy: [place] bilateral anterior cervical lymphadenopathy. and No enlarged lymph nodes appreciated in the occipital, axillary, epitrochlear, inguinal, or popliteal regions. Lungs: Decreased breath sounds with expiratory wheezing bilaterally. Increased work of breathing. CV: Normal S1/S2, no murmurs. Capillary refill <2 seconds.  Abdomen: Nondistended, nontender. No guarding or rebound tenderness. Bowel sounds faint and present in all fields  Skin: Warm and dry, without lesions or rashes. Extremities:  No clubbing, cyanosis or edema. Neuro:   Grossly intact. No focal deficits appreciated. Responsive to questions.  Diagnostic studies:   Spirometry: results abnormal (FEV1: 0.93/36%, FVC: 1.10/35%, FEV1/FVC: 84%).    Spirometry consistent with possible restrictive disease. Albuterol/Atrovent nebulizer treatment given in clinic with significant improvement. Her FVC increased 300 mL (28%), while her FEV1 increased to 180 mL of (19%).  Allergy Studies:   Indoor/Outdoor Percutaneous Adult Environmental Panel: positive to Df mite and Dp mites. Otherwise negative with adequate controls.  Indoor/Outdoor Selected Intradermal Environmental Panel: positive to [place] grass, [place] grass, Grass mix, mold mix #4, cat and cockroach. Otherwise negative with adequate controls.  Most Common Foods Panel (peanut, cashew, soy, fish mix, shellfish mix, wheat, milk, egg): Negative to all with adequate controls      Salvatore Marvel, MD [facility]

## 2017-01-03 NOTE — Patient Instructions (Addendum)
1. Severe persistent asthma, uncomplicated - Lung testing was terrible today, but it did improve with albuterol nebulizer use. - Stop the Advair and start Breo 200/25 one puff once daily. - Steroid injection given today.  - Daily controller medication(s): Breo 200/25 one puff once daily - Rescue medications: albuterol 4 puffs every 4-6 hours as needed - Asthma control goals:  * Full participation in all desired activities (may need albuterol before activity) * Albuterol use two time or less a week on average (not counting use with activity) * Cough interfering with sleep two time or less a month * Oral steroids no more than once a year * No hospitalizations  2. Perennial allergic rhinitis - Testing today showed: grasses, molds, dust mites, cat and cockroach - Avoidance measures provided. - Stop the Zyrtec and the Flonase.  - Start Xyzal (levocetirizine) 5mg  tablet once daily and Dymista (fluticasone/azelastine) two sprays per nostril 1-2 times daily as needed - You can use an extra dose of the antihistamine, if needed, for breakthrough symptoms.  - Consider allergy shots as a means of long-term control. - Allergy shots "re-train" the immune system to ignore environmental allergens and decrease the resulting immune response to those allergens.  - We can discuss more at the next appointment if the medications are not working for you.  3. Gastroesophageal reflux disease - Agree with the plan to re-establish care with the Gastroenterologist.  - Samples provided for Dexilant 60mg  once daily.   4. Return in about 4 weeks (around 01/31/2017).   Please inform us of any Emergency Department visits, hospitalizations, or changes in symptoms. Call us before going to the ED for breathing or allergy symptoms since we might be able to fit you in for a sick visit. Feel free to contact us anytime with any questions, problems, or concerns.  It was a pleasure to meet you today! Happy summer!   Websites  that have reliable patient information: 1. American Academy of Asthma, Allergy, and Immunology: www.aaaai.org 2. Food Allergy Research and Education (FARE): foodallergy.org 3. Mothers of Asthmatics: http://www.asthmacommunitynetwork.org 4. American College of Allergy, Asthma, and Immunology: www.acaai.org  Control of House Dust Mite Allergen    House dust mites play a major role in allergic asthma and rhinitis.  They occur in environments with high humidity wherever human skin, the food for dust mites is found. High levels have been detected in dust obtained from mattresses, pillows, carpets, upholstered furniture, bed covers, clothes and soft toys.  The principal allergen of the house dust mite is found in its feces.  A gram of dust may contain 1,000 mites and 250,000 fecal particles.  Mite antigen is easily measured in the air during house cleaning activities.    1. Encase mattresses, including the box spring, and pillow, in an air tight cover.  Seal the zipper end of the encased mattresses with wide adhesive tape. 2. Wash the bedding in water of 130 degrees Farenheit weekly.  Avoid cotton comforters/quilts and flannel bedding: the most ideal bed covering is the dacron comforter. 3. Remove all upholstered furniture from the bedroom. 4. Remove carpets, carpet padding, rugs, and non-washable window drapes from the bedroom.  Wash drapes weekly or use plastic window coverings. 5. Remove all non-washable stuffed toys from the bedroom.  Wash stuffed toys weekly. 6. Have the room cleaned frequently with a vacuum cleaner and a damp dust-mop.  The patient should not be in a room which is being cleaned and should wait 1 hour after cleaning before  going into the room. 7. Close and seal all heating outlets in the bedroom.  Otherwise, the room will become filled with dust-laden air.  An electric heater can be used to heat the room. 8. Reduce indoor humidity to less than 50%.  Do not use a  humidifier.  Reducing Pollen Exposure  The American Academy of Allergy, Asthma and Immunology suggests the following steps to reduce your exposure to pollen during allergy seasons.    1. Do not hang sheets or clothing out to dry; pollen may collect on these items. 2. Do not mow lawns or spend time around freshly cut grass; mowing stirs up pollen. 3. Keep windows closed at night.  Keep car windows closed while driving. 4. Minimize morning activities outdoors, a time when pollen counts are usually at their highest. 5. Stay indoors as much as possible when pollen counts or humidity is high and on windy days when pollen tends to remain in the air longer. 6. Use air conditioning when possible.  Many air conditioners have filters that trap the pollen spores. 7. Use a HEPA room air filter to remove pollen form the indoor air you breathe.  Control of Mold Allergen  Mold and fungi can grow on a variety of surfaces provided certain temperature and moisture conditions exist.  Outdoor molds grow on plants, decaying vegetation and soil.  The major outdoor mold, Alternaria and Cladosporium, are found in very high numbers during hot and dry conditions.  Generally, a late Summer - Fall peak is seen for common outdoor fungal spores.  Rain will temporarily lower outdoor mold spore count, but counts rise rapidly when the rainy period ends.  The most important indoor molds are Aspergillus and Penicillium.  Dark, humid and poorly ventilated basements are ideal sites for mold growth.  The next most common sites of mold growth are the bathroom and the kitchen.  Outdoor [employer] 1. Use air conditioning and keep windows closed 2. Avoid exposure to decaying vegetation. 3. Avoid leaf raking. 4. Avoid grain handling. 5. Consider wearing a face mask if working in moldy areas.  Indoor Mold Control 1. Maintain humidity below 50%. 2. Clean washable surfaces with 5% bleach solution. 3. Remove sources e.g.  contaminated carpets.  Control of Cockroach Allergen  Cockroach allergen has been identified as an important cause of acute attacks of asthma, especially in urban settings.  There are fifty-five species of cockroach that exist in the [place], however only three, the [place], [place] species produce allergen that can affect patients with Asthma.  Allergens can be obtained from fecal particles, egg casings and secretions from cockroaches.    1. Remove food sources. 2. Reduce access to water. 3. Seal access and entry points. 4. Spray runways with 0.5-1% Diazinon or Chlorpyrifos 5. Blow boric acid power under stoves and refrigerator. 6. Place bait stations (hydramethylnon) at feeding sites.  Control of Dog or Cat Allergen  Avoidance is the best way to manage a dog or cat allergy. If you have a dog or cat and are allergic to dog or cats, consider removing the dog or cat from the home. If you have a dog or cat but don't want to find it a new home, or if your family wants a pet even though someone in the household is allergic, here are some strategies that may help keep symptoms at bay:  1. Keep the pet out of your bedroom and restrict it to only a few rooms. Be advised that keeping  the dog or cat in only one room will not limit the allergens to that room. 2. Don't pet, hug or kiss the dog or cat; if you do, wash your hands with soap and water. 3. High-efficiency particulate air (HEPA) cleaners run continuously in a bedroom or living room can reduce allergen levels over time. 4. Regular use of a high-efficiency vacuum cleaner or a central vacuum can reduce allergen levels. 5. Giving your dog or cat a bath at least once a week can reduce airborne allergen.

## 2017-01-22 ENCOUNTER — Other Ambulatory Visit (INDEPENDENT_AMBULATORY_CARE_PROVIDER_SITE_OTHER): Payer: BC Managed Care – PPO

## 2017-01-22 ENCOUNTER — Other Ambulatory Visit: Payer: Self-pay | Admitting: Endocrinology

## 2017-01-22 DIAGNOSIS — E559 Vitamin D deficiency, unspecified: Secondary | ICD-10-CM

## 2017-01-22 DIAGNOSIS — R7303 Prediabetes: Secondary | ICD-10-CM

## 2017-01-22 DIAGNOSIS — E041 Nontoxic single thyroid nodule: Secondary | ICD-10-CM

## 2017-01-22 DIAGNOSIS — E7800 Pure hypercholesterolemia, unspecified: Secondary | ICD-10-CM

## 2017-01-22 LAB — COMPREHENSIVE METABOLIC PANEL
ALT: 18 U/L (ref 0–35)
AST: 15 U/L (ref 0–37)
Albumin: 3.7 g/dL (ref 3.5–5.2)
Alkaline Phosphatase: 70 U/L (ref 39–117)
BUN: 10 mg/dL (ref 6–23)
CO2: 29 mEq/L (ref 19–32)
Calcium: 9.5 mg/dL (ref 8.4–10.5)
Chloride: 104 mEq/L (ref 96–112)
Creatinine, Ser: 0.55 mg/dL (ref 0.40–1.20)
GFR: 148.06 mL/min (ref >60.00)
Glucose, Bld: 115 mg/dL — ABNORMAL HIGH (ref 70–99)
Potassium: 4.4 mEq/L (ref 3.5–5.1)
Sodium: 140 mEq/L (ref 135–145)
Total Bilirubin: 0.3 mg/dL (ref 0.2–1.2)
Total Protein: 7.1 g/dL (ref 6.0–8.3)

## 2017-01-22 LAB — LIPID PANEL
Cholesterol: 144 mg/dL (ref 0–200)
HDL: 52.9 mg/dL (ref >39.00)
LDL Cholesterol: 81 mg/dL (ref 0–99)
NonHDL: 90.81
Total CHOL/HDL Ratio: 3
Triglycerides: 50 mg/dL (ref 0.0–149.0)
VLDL: 10 mg/dL (ref 0.0–40.0)

## 2017-01-22 LAB — VITAMIN D 25 HYDROXY (VIT D DEFICIENCY, FRACTURES): VITD: 39.88 ng/mL (ref 30.00–100.00)

## 2017-01-22 LAB — HEMOGLOBIN A1C: Hgb A1c MFr Bld: 6.5 % (ref 4.6–6.5)

## 2017-01-22 LAB — TSH: TSH: 1.15 u[IU]/mL (ref 0.35–4.50)

## 2017-01-23 NOTE — Progress Notes (Signed)
Patient ID: Natasha Lyons, female   DOB: Apr 06, 1961, 56 y.o.   MRN: [id number]   History of Present Illness:  She is here for followup after about 2 years  1. OBESITY:  Because of failure of diet and exercise to help her lose weight she was on Qsymia in 2014 with excellent results and significant weight loss. Her maximum weight has been about 258 in the past. She stopped Qsymia in 10/14 and initially was able to continue losing weight with lifestyle changes. She was back on Qsymia in 11/15 and this was stopped after her last visit on her own  More recently because of various medical and personal issues she has not been able to watch her diet She has not been able to exercise also She has gained back about 40 pounds since her last visit 2 years ago  She says she wants to go back on treatment with Qsymia She also things that she can start exercising some more and is somewhat more motivated to watch her diet consistently   Wt Readings from Last 3 Encounters:  01/24/17 239 lb (108.4 kg)  01/03/17 243 lb 3.2 oz (110.3 kg)  06/17/16 235 lb (106.6 kg)     Prediabetes:  She had been on metformin previously since 2009 with mostly impaired fasting glucose and normal A1c but she has stopped taking this on her own.  Recent fasting glucose is back up to 115 A1c is upper normal in the lab but higher than usual, has been as low as 5.2 previously As above she has gained weight She does have a meter at home but checking only infrequently with a Generic monitor, she thinks her fasting blood sugars are just above 100 usually  Lab Results  Component Value Date   HGBA1C 6.5 01/22/2017    3. Vitamin D deficiency: She is taking vitamin D 50,000 units weekly, This has been continued long-term Her level is about 40     Allergies as of 01/24/2017   No Known Allergies     Medication List       Accurate as of 01/24/17  8:48 AM. Always use your most recent med list.            amLODipine 5 MG tablet Commonly known as:  NORVASC Take 5 mg by mouth daily.   Azelastine-Fluticasone 137-50 MCG/ACT Susp Commonly known as:  DYMISTA 2 sprays per nostril 1-2 times a day   BAYER CONTOUR NEXT TEST test strip Generic drug:  glucose blood USE AS INSTRUCTED TO CHECK BLOOD SUGARS ONCE A DAY   BIOFREEZE ROLL-ON COLORLESS 4 % Gel Generic drug:  Menthol (Topical Analgesic) Apply 1-2 application topically 4 (four) times daily as needed (for pain.).   Cinnamon 500 MG Tabs Take 500 mg by mouth daily.   COSENTYX 300 DOSE 150 MG/ML Sosy Generic drug:  Secukinumab Inject 300 mg into the skin every 30 (thirty) days.   Dexlansoprazole 30 MG capsule Take 30 mg by mouth daily.   folic acid 151 MCG tablet Commonly known as:  FOLVITE Take 800-1,600 mcg by mouth as directed. 1600 mcg on Sundays and 800 mcg on all other days.   levocetirizine 5 MG tablet Commonly known as:  XYZAL Take 1 tablet (5 mg total) by mouth every evening.   metFORMIN 500 MG 24 hr tablet Commonly known as:  GLUCOPHAGE-XR TAKE 2 TABLETS (1,000 MG TOTAL) BY MOUTH DAILY WITH SUPPER.   methocarbamol 500 MG tablet Commonly known as:  ROBAXIN  Take 500 mg by mouth 4 (four) times daily as needed for muscle spasms.   methotrexate 2.5 MG tablet Commonly known as:  RHEUMATREX Take 20 mg by mouth every Sunday. Caution:Chemotherapy. Protect from light.   multivitamin with minerals Tabs tablet Take 1 tablet by mouth daily.   polyvinyl alcohol 1.4 % ophthalmic solution Commonly known as:  LIQUIFILM TEARS Place 1-2 drops into both eyes 3 (three) times daily as needed for dry eyes.   albuterol (2.5 MG/3ML) 0.083% nebulizer solution Commonly known as:  PROVENTIL Take 2.5 mg by nebulization every 6 (six) hours as needed for wheezing or shortness of breath.   PROVENTIL HFA 108 (90 Base) MCG/ACT inhaler Generic drug:  albuterol Inhale 2 puffs into the lungs every 6 (six) hours as needed for wheezing.    SINGULAIR 10 MG tablet Generic drug:  montelukast Take 10 mg by mouth at bedtime.   traMADol 50 MG tablet Commonly known as:  ULTRAM Take 1 tablet (50 mg total) by mouth every 6 (six) hours as needed.   Turmeric 500 MG Tabs Take 500 mg by mouth daily.   venlafaxine XR 75 MG 24 hr capsule Commonly known as:  EFFEXOR-XR TAKE 1 CAPSULE (75 MG TOTAL) BY MOUTH DAILY   Vitamin D (Ergocalciferol) 50000 units Caps capsule Commonly known as:  DRISDOL Take 1 capsule (50,000 Units total) by mouth every 7 (seven) days.       Allergies: No Known Allergies  Past Medical History:  Diagnosis Date  . Allergy    takes Zyrtec daily  . Arthritis    psorriatric arthritis  . Asthma    Albuterol as needed as well as neb as needed  . Chronic back pain    pinched nerve  . Cough   . Family history of adverse reaction to anesthesia    pts dad gets sick with anesthesia  . Fibromyalgia    takes Effexor daily  . Headache    seasonal   . History of bronchitis 2016  . History of shingles   . Hypertension    takes Amlodipine daily  . Joint pain   . Joint swelling   . Muscle spasm    takes Robaxin as needed  . Osteoarthritis   . Pneumonia    hx of-10+ yrs ago    Past Surgical History:  Procedure Laterality Date  . APPENDECTOMY    . BIOPSY THYROID  2014   results benign  . CHOLECYSTECTOMY  2008  . DIAGNOSTIC LAPAROSCOPY     d/c endometriosis  . DILATION AND CURETTAGE OF UTERUS    . INSERTION OF MESH N/A 06/15/2016   Procedure: INSERTION OF MESH;  Surgeon: Autumn Messing III, MD;  Location: [facility];  Service: General;  Laterality: N/A;  . KNEE ARTHROSCOPY Bilateral 2011  . MASS EXCISION Right 04/21/2014   Procedure: EXCISION OF RIGHT BACK MASS;  Surgeon: Coralie Keens, MD;  Location: [facility];  Service: General;  Laterality: Right;  . TONSILLECTOMY    . VENTRAL HERNIA REPAIR  06/15/2016  . VENTRAL HERNIA REPAIR N/A 06/15/2016   Procedure: LAPAROSCOPIC VENTRAL HERNIA  WITH MESH;  Surgeon: Autumn Messing III, MD;  Location: [facility];  Service: General;  Laterality: N/A;    Family History  Problem Relation Age of Onset  . Diabetes Mother   . Hypertension Mother     Social History:  reports that she has never smoked. She has never used smokeless tobacco. She reports that she drinks alcohol. She reports that  she does not use drugs.  Review of Systems    History of hypertension:  She had been on treatment for the last few years and Previously was taken off treatment when she had lost weight She is back on 5 mg amlodipine from her PCP  Home blood pressure readings: none recently  Has had history of severe psoriasis with arthritis and is on treatment with methotrexate, she does feel that she catches infections easily with this  She has a history of low HDL: This is improved  Labs:   Lab Results  Component Value Date   CHOL 144 01/22/2017   HDL 52.90 01/22/2017   LDLCALC 81 01/22/2017   TRIG 50.0 01/22/2017   CHOLHDL 3 01/22/2017    THYROID NODULE:   She had mostly assisted nodule on the left side previously She thinks it sometimes gets bigger  This has been benign on biopsy previously Although she had been on thyroid supplements a few years ago she has been euthyroid without any levothyroxine for some time  Lab Results  Component Value Date   TSH 1.15 01/22/2017   TSH 0.85 12/30/2014   TSH 0.63 11/04/2013   FREET4 0.74 12/30/2014   FREET4 0.89 07/28/2013   FREET4 1.12 04/11/2013      EXAM:  BP 130/88   Pulse 65   Ht 5' 4.5" (1.638 m)   Wt 239 lb (108.4 kg)   SpO2 95%   BMI 40.39 kg/m   She has generalized obesity  Her thyroid is enlarged about twice normal on the right side and slightly firm, no distinct nodules felt Left nodule is about 1. 5-2 centimeter and slightly tender on swallowing Ankles: no edema Biceps reflexes normal.  Assessment/Plan:   1. Weight gain: She previously was  able to lose significant amount of  weight with Qsymia. With her not taking the medication as well as not exercising or being consistent with diet her weight has gone up 40 pounds in the last 2 years. She does need to be maintaining her weight loss since she has benefited previously with control of hypertension and glucose abnormalities with weight loss She is concerned about the cost of Qsymia but still wants to see if this will be covered by insurance She is going to start regular exercise program also She will cut back on high-calorie foods    2. PREDIABETES: Her glucose is 115 fasting, has been previously borderline diabetic at 125  However A1c is now 6.5 which is higher than usual  Although she will benefit from weight loss recommend that the Start metformin back again to help her insulin sensitivity and may also help promote weight loss and more consistent control of fasting reading She will also periodically monitor fasting or postprandial reading She can go back to 1000 mg of metformin daily, she can take this in the evenings with her Singulair Needs follow-up in 3 months with A1c  3.  HYPERTENSION: Her blood pressure is controlled with current regimen and followed by PCP  4.  History of thyroid nodule/goiter: Clinically stable and still euthyroid.   Likely she had a multinodular goiter with enlargement of the right lobe also TSH again normal  5.  HYPERLIPIDEMIA: Currently fairly good  6.  Vitamin D deficiency:  She will continue the supplement  Although her other questions were answered today  Counseling time on subjects discussed in assessment and plan sections is over 50% of today's 25 minute visit    Myers Tutterow 01/24/2017, 8:48 AM

## 2017-01-24 ENCOUNTER — Ambulatory Visit (INDEPENDENT_AMBULATORY_CARE_PROVIDER_SITE_OTHER): Payer: BC Managed Care – PPO | Admitting: Endocrinology

## 2017-01-24 ENCOUNTER — Other Ambulatory Visit: Payer: Self-pay

## 2017-01-24 ENCOUNTER — Encounter: Payer: Self-pay | Admitting: Endocrinology

## 2017-01-24 VITALS — BP 130/88 | HR 65 | Ht 64.5 in | Wt 239.0 lb

## 2017-01-24 DIAGNOSIS — E782 Mixed hyperlipidemia: Secondary | ICD-10-CM | POA: Diagnosis not present

## 2017-01-24 DIAGNOSIS — R7303 Prediabetes: Secondary | ICD-10-CM | POA: Diagnosis not present

## 2017-01-24 DIAGNOSIS — E042 Nontoxic multinodular goiter: Secondary | ICD-10-CM

## 2017-01-24 MED ORDER — PHENTERMINE-TOPIRAMATE ER 7.5-46 MG PO CP24: ORAL_CAPSULE | ORAL | 2 refills | Status: DC

## 2017-01-24 MED ORDER — GLUCOSE BLOOD VI STRP: ORAL_STRIP | 3 refills | Status: DC

## 2017-01-26 ENCOUNTER — Telehealth: Payer: Self-pay

## 2017-01-26 NOTE — Telephone Encounter (Signed)
Called patient to see if she would be willing to change from qsymia to belviq. Patient agreed. Placed paperwork back MD desk to give the dosing so we can send it in.

## 2017-01-31 ENCOUNTER — Telehealth: Payer: Self-pay

## 2017-01-31 ENCOUNTER — Telehealth: Payer: Self-pay | Admitting: Endocrinology

## 2017-01-31 ENCOUNTER — Other Ambulatory Visit: Payer: Self-pay

## 2017-01-31 ENCOUNTER — Ambulatory Visit: Payer: BC Managed Care – PPO | Admitting: Allergy & Immunology

## 2017-01-31 MED ORDER — LORCASERIN HCL ER 20 MG PO TB24: 20.0000 mg | ORAL_TABLET | Freq: Every day | ORAL | 3 refills | Status: DC

## 2017-01-31 NOTE — Telephone Encounter (Signed)
Can you prescribe an alternative? Or does she need to contact her insurance? Please advise.

## 2017-01-31 NOTE — Telephone Encounter (Signed)
This has been ordered and patient has been notified 

## 2017-01-31 NOTE — Telephone Encounter (Signed)
I have already sent a note back to start Belviq CR 20 mg daily, please prescribe

## 2017-01-31 NOTE — Telephone Encounter (Signed)
Changed Qsymia to Belviq XR per Dr. Ronnie Derby request and the patient has been notified

## 2017-01-31 NOTE — Telephone Encounter (Signed)
Patient's insurance will not cover the Phentermine-Topiramate (QSYMIA) 7.5-46 MG CP24. Call the pharmacy  [id number] - [place], [place] - [address] [phone] (Phone) [phone] (Fax)   To discuss an alternative.

## 2017-02-06 ENCOUNTER — Telehealth: Payer: Self-pay | Admitting: Endocrinology

## 2017-02-06 NOTE — Telephone Encounter (Signed)
Patient called pharmacy and was informed that the pharmacy is still waiting for insurance approval. Pharmacy wants to know if paperwork has been faxed yet.   [id number] [address], [place] [phone] (Phone) [phone] (Fax)   Please call patient and advise when done. OK to leave message.

## 2017-02-06 NOTE — Telephone Encounter (Signed)
Hey! I have seen where you did some paperwork for this patient. Have you seen an approval yet?

## 2017-02-06 NOTE — Telephone Encounter (Signed)
Patient was switched from Qsymia to belviq and she was notified

## 2017-02-09 NOTE — Telephone Encounter (Signed)
Patient called insurance company and they stated that they need PA from Dr. Dwyane Dee for the [employer]. Please call insurance at [phone] to advise.

## 2017-02-09 NOTE — Telephone Encounter (Signed)
Patient's script for Belviq has not been approved b/c of missing paperwork. Please advise.   Thank you,  -LL

## 2017-02-09 NOTE — Telephone Encounter (Signed)
Routing to you °

## 2017-02-12 NOTE — Telephone Encounter (Signed)
Called patient and left a voice message to let her know that her PA for the Belviq has been approved and if she needs for me to resubmit to her pharmacy to please call and let me know.

## 2017-02-12 NOTE — Telephone Encounter (Signed)
Natasha Lyons from [employer] stated that the Belviq has been approved and he would be sending the approval over and also resubmit the medication claim.

## 2017-02-12 NOTE — Telephone Encounter (Signed)
Called the number provided and spoke with [name]. Starting PA process.

## 2017-02-22 ENCOUNTER — Telehealth: Payer: Self-pay | Admitting: Endocrinology

## 2017-02-22 NOTE — Telephone Encounter (Signed)
Routing to you °

## 2017-02-22 NOTE — Telephone Encounter (Signed)
She did not have any abnormal swelling on her last visit.  Usually there is no pain in thyroid gland and she may need to see her PCP first unless she is having the pain in the previous nodule

## 2017-02-22 NOTE — Telephone Encounter (Signed)
Patient called in reference to thyroid still being swollen. Patient stated tylenol is not helping. Patient wants to know if she needs to be seen. Please call patient and advise. OK to leave message.

## 2017-02-22 NOTE — Telephone Encounter (Signed)
I contacted the patient and advised of message via voicemail. Patient advised to call back if the pain she is having is located in the previous nodule.

## 2017-02-22 NOTE — Telephone Encounter (Signed)
See message and please advise, Thanks!  

## 2017-03-22 ENCOUNTER — Emergency Department (HOSPITAL_COMMUNITY)
Admission: EM | Admit: 2017-03-22 | Discharge: 2017-03-23 | Disposition: A | Discharge status: Home / Self Care | Payer: BC Managed Care – PPO | Attending: Emergency Medicine | Admitting: Emergency Medicine

## 2017-03-22 ENCOUNTER — Encounter (HOSPITAL_COMMUNITY): Payer: Self-pay | Admitting: Emergency Medicine

## 2017-03-22 ENCOUNTER — Emergency Department (HOSPITAL_COMMUNITY): Payer: BC Managed Care – PPO

## 2017-03-22 DIAGNOSIS — Y998 Other external cause status: Secondary | ICD-10-CM | POA: Diagnosis not present

## 2017-03-22 DIAGNOSIS — S81811A Laceration without foreign body, right lower leg, initial encounter: Secondary | ICD-10-CM | POA: Insufficient documentation

## 2017-03-22 DIAGNOSIS — Z79899 Other long term (current) drug therapy: Secondary | ICD-10-CM | POA: Diagnosis not present

## 2017-03-22 DIAGNOSIS — Z23 Encounter for immunization: Secondary | ICD-10-CM | POA: Diagnosis not present

## 2017-03-22 DIAGNOSIS — Z9049 Acquired absence of other specified parts of digestive tract: Secondary | ICD-10-CM | POA: Insufficient documentation

## 2017-03-22 DIAGNOSIS — Y92009 Unspecified place in unspecified non-institutional (private) residence as the place of occurrence of the external cause: Secondary | ICD-10-CM | POA: Insufficient documentation

## 2017-03-22 DIAGNOSIS — W228XXA Striking against or struck by other objects, initial encounter: Secondary | ICD-10-CM | POA: Diagnosis not present

## 2017-03-22 DIAGNOSIS — J45909 Unspecified asthma, uncomplicated: Secondary | ICD-10-CM | POA: Diagnosis not present

## 2017-03-22 DIAGNOSIS — Y9389 Activity, other specified: Secondary | ICD-10-CM | POA: Diagnosis not present

## 2017-03-22 MED ORDER — OXYCODONE-ACETAMINOPHEN 5-325 MG PO TABS
ORAL_TABLET | ORAL | Status: AC
  Administered 2017-03-22: 1
  Filled 2017-03-22: qty 1

## 2017-03-22 NOTE — ED Notes (Signed)
BIB EMS from home, Pt hit R shin on metal frame of bed, approx 2 in lac noted, exposed adipose tissue. Bleeding controlled. Wrapped in gauze.

## 2017-03-23 MED ORDER — LIDOCAINE-EPINEPHRINE (PF) 2 %-1:200000 IJ SOLN
20.0000 mL | Freq: Once | INTRAMUSCULAR | Status: AC
  Administered 2017-03-23: 20 mL via INTRADERMAL
  Filled 2017-03-23: qty 20

## 2017-03-23 MED ORDER — TETANUS-DIPHTH-ACELL PERTUSSIS 5-2.5-18.5 LF-MCG/0.5 IM SUSP
0.5000 mL | Freq: Once | INTRAMUSCULAR | Status: AC
  Administered 2017-03-23: 0.5 mL via INTRAMUSCULAR
  Filled 2017-03-23: qty 0.5

## 2017-03-23 NOTE — ED Provider Notes (Signed)
[facility] DEPT Provider Note   CSN: [id number] Arrival date & time: 03/22/17  2234     History   Chief Complaint Chief Complaint  Patient presents with  . Laceration    HPI Natasha Lyons is a 56 y.o. female who presents with a right lower leg laceration. PMH significant for fibromyalgia, chronic back pain, HTN, asthma, psoriatic arthritis. She states that she was walking in her home and hit her leg on a metal frame. The incident occurred at about 9PM. There was a large amount of bleeding so EMS was called. She has had difficulty walking due to pain. No numbness or weakness. She is not up to date on tetanus.   HPI  Past Medical History:  Diagnosis Date  . Allergy    takes Zyrtec daily  . Arthritis    psorriatric arthritis  . Asthma    Albuterol as needed as well as neb as needed  . Chronic back pain    pinched nerve  . Cough   . Family history of adverse reaction to anesthesia    pts dad gets sick with anesthesia  . Fibromyalgia    takes Effexor daily  . Headache    seasonal   . History of bronchitis 2016  . History of shingles   . Hypertension    takes Amlodipine daily  . Joint pain   . Joint swelling   . Muscle spasm    takes Robaxin as needed  . Osteoarthritis   . Pneumonia    hx of-10+ yrs ago    Patient Active Problem List   Diagnosis Date Noted  . Ventral hernia without obstruction or gangrene 06/15/2016  . Nonallopathic lesion of cervical region 04/24/2016  . Nonallopathic lesion of rib cage 04/24/2016  . SI (sacroiliac) joint dysfunction 03/21/2016  . Nonallopathic lesion of sacral region 03/21/2016  . Nonallopathic lesion of lumbosacral region 03/21/2016  . Nonallopathic lesion of thoracic region 03/21/2016  . 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 Psoriatic arthritis ([place]) 04/07/2015  . Chronic pain syndrome 04/07/2015  . Psoriasis 08/10/2013  . Vitamin D deficiency 08/10/2013  . Thyroid nodule 05/02/2013  .  Extrinsic asthma, unspecified 03/06/2013  . Obesity 03/06/2013    Past Surgical History:  Procedure Laterality Date  . APPENDECTOMY    . BIOPSY THYROID  2014   results benign  . CHOLECYSTECTOMY  2008  . DIAGNOSTIC LAPAROSCOPY     d/c endometriosis  . DILATION AND CURETTAGE OF UTERUS    . INSERTION OF MESH N/A 06/15/2016   Procedure: INSERTION OF MESH;  Surgeon: Autumn Messing III, MD;  Location: [facility] [place];  Service: General;  Laterality: N/A;  . KNEE ARTHROSCOPY Bilateral 2011  . MASS EXCISION Right 04/21/2014   Procedure: EXCISION OF RIGHT BACK MASS;  Surgeon: Coralie Keens, MD;  Location: [facility];  Service: General;  Laterality: Right;  . TONSILLECTOMY    . VENTRAL HERNIA REPAIR  06/15/2016  . VENTRAL HERNIA REPAIR N/A 06/15/2016   Procedure: LAPAROSCOPIC VENTRAL HERNIA WITH MESH;  Surgeon: Autumn Messing III, MD;  Location: [facility];  Service: General;  Laterality: N/A;    OB History    No data available       Home Medications    Prior to Admission medications   Medication Sig Start Date End Date Taking? Authorizing Provider  albuterol (PROVENTIL HFA) 108 (90 BASE) MCG/ACT inhaler Inhale 2 puffs into the lungs every 6 (six) hours as needed for wheezing.  [provider]  albuterol (PROVENTIL) (2.5 MG/3ML) 0.083% nebulizer solution Take 2.5 mg by nebulization every 6 (six) hours as needed for wheezing or shortness of breath.    [provider]  amLODipine (NORVASC) 5 MG tablet Take 5 mg by mouth daily.    [provider]  Azelastine-Fluticasone [facility]) 137-50 MCG/ACT SUSP 2 sprays per nostril 1-2 times a day 01/03/17   Valentina Shaggy, MD  BAYER CONTOUR NEXT TEST test strip USE AS INSTRUCTED TO CHECK BLOOD SUGARS ONCE A DAY 03/01/15   [name] Snare, MD  Cinnamon 500 MG TABS Take 500 mg by mouth daily.    [provider]  Dexlansoprazole 30 MG capsule Take 30 mg by mouth daily.    [provider]  folic acid (FOLVITE) 299  MCG tablet Take 800-1,600 mcg by mouth as directed. 1600 mcg on Sundays and 800 mcg on all other days.    [provider]  glucose blood test strip Use to check blood sugar once daily 01/24/17   [name] Snare, MD  levocetirizine (XYZAL) 5 MG tablet Take 1 tablet (5 mg total) by mouth every evening. 01/03/17   Valentina Shaggy, MD  Lorcaserin HCl ER (BELVIQ XR) 20 MG TB24 Take 20 mg by mouth daily. 01/31/17   [name] Snare, MD  Menthol, Topical Analgesic, (BIOFREEZE ROLL-ON COLORLESS) 4 % GEL Apply 1-2 application topically 4 (four) times daily as needed (for pain.).    [provider]  metFORMIN (GLUCOPHAGE-XR) 500 MG 24 hr tablet TAKE 2 TABLETS (1,000 MG TOTAL) BY MOUTH DAILY WITH SUPPER. Patient not taking: Reported on 01/24/2017 08/23/15   [name] Snare, MD  methocarbamol (ROBAXIN) 500 MG tablet Take 500 mg by mouth 4 (four) times daily as needed for muscle spasms.    [provider]  methotrexate (RHEUMATREX) 2.5 MG tablet Take 20 mg by mouth every Sunday. Caution:Chemotherapy. Protect from light.     [provider]  montelukast (SINGULAIR) 10 MG tablet Take 10 mg by mouth at bedtime.    [provider]  Multiple Vitamin (MULTIVITAMIN WITH MINERALS) TABS tablet Take 1 tablet by mouth daily.    [provider]  Phentermine-Topiramate [facility]) 7.5-46 MG CP24 1 daily 01/24/17   [name] Snare, MD  polyvinyl alcohol (LIQUIFILM TEARS) 1.4 % ophthalmic solution Place 1-2 drops into both eyes 3 (three) times daily as needed for dry eyes.    [provider]  Secukinumab (COSENTYX 300 DOSE) 150 MG/ML SOSY Inject 300 mg into the skin every 30 (thirty) days.    [provider]  traMADol (ULTRAM) 50 MG tablet Take 1 tablet (50 mg total) by mouth every 6 (six) hours as needed. Patient taking differently: Take 50 mg by mouth every 6 (six) hours as needed (for pain.).  03/21/16   Lyndal Pulley, DO  Turmeric 500 MG TABS Take 500 mg by mouth daily.     [provider]  venlafaxine XR (EFFEXOR-XR) 75 MG 24 hr capsule TAKE 1 CAPSULE (75 MG TOTAL) BY MOUTH DAILY 11/27/16   Lyndal Pulley, DO  Vitamin D, Ergocalciferol, (DRISDOL) 50000 UNITS CAPS capsule Take 1 capsule (50,000 Units total) by mouth every 7 (seven) days. Patient taking differently: Take 50,000 Units by mouth every Sunday.  04/12/15   Lyndal Pulley, DO    Family History Family History  Problem Relation Age of Onset  . Diabetes Mother   . Hypertension Mother     Social History Social History  Substance Use Topics  .  Smoking status: Never Smoker  . Smokeless tobacco: Never Used  . Alcohol use Yes     Comment: occ     Allergies   Patient has no known allergies.   Review of Systems Review of Systems  Musculoskeletal: Positive for gait problem.  Skin: Positive for wound.  Neurological: Negative for weakness and numbness.     Physical Exam Updated Vital Signs BP (!) 161/83 (BP Location: Right Arm)   Pulse 80   Temp 97.6 F (36.4 C) (Oral)   Resp 20   Ht 5\' 4"  (1.626 m)   Wt 107.5 kg (237 lb)   SpO2 97%   BMI 40.68 kg/m   Physical Exam  Constitutional: She is oriented to person, place, and time. She appears well-developed and well-nourished. No distress.  HENT:  Head: Normocephalic and atraumatic.  Eyes: Pupils are equal, round, and reactive to light. Conjunctivae are normal. Right eye exhibits no discharge. Left eye exhibits no discharge. No scleral icterus.  Neck: Normal range of motion.  Cardiovascular: Normal rate.   Pulmonary/Chest: Effort normal. No respiratory distress.  Abdominal: She exhibits no distension.  Neurological: She is alert and oriented to person, place, and time.  Skin: Skin is warm and dry.  6cm vertical linear laceration on the right lower leg with fat exposed  Psychiatric: She has a normal mood and affect. Her behavior is normal.  Nursing note and vitals reviewed.    ED Treatments / Results  Labs (all labs  ordered are listed, but only abnormal results are displayed) Labs Reviewed - No data to display  EKG  EKG Interpretation None       Radiology Dg Tibia/fibula Right  Result Date: 03/22/2017 CLINICAL DATA:  Right shin laceration after striking lower leg on bed frame today. Lower leg pain. EXAM: RIGHT TIBIA AND FIBULA - 2 VIEW COMPARISON:  None. FINDINGS: There is no evidence of fracture or other focal bone lesions. Soft tissue edema of the lower leg. Site of laceration not well-defined radiographically. No radiopaque foreign body. IMPRESSION: Soft tissue edema. No radiopaque foreign body or osseous abnormality. Electronically Signed   By: Jeb Levering M.D.   On: 03/22/2017 23:05    Procedures .Marland KitchenLaceration Repair Date/Time: 03/23/2017 1:11 AM Performed by: Janetta Hora MARIE Authorized by: Janetta Hora MARIE   Consent:    Consent obtained:  Verbal   Consent given by:  Patient   Risks discussed:  Pain and infection Anesthesia (see MAR for exact dosages):    Anesthesia method:  Local infiltration   Local anesthetic:  Lidocaine 2% WITH epi Laceration details:    Location:  Leg   Leg location:  R lower leg   Length (cm):  6   Depth (mm):  4 Repair type:    Repair type:  Simple Pre-procedure details:    Preparation:  Patient was prepped and draped in usual sterile fashion and imaging obtained to evaluate for foreign bodies Exploration:    Hemostasis achieved with:  Epinephrine   Wound exploration: wound explored through full range of motion and entire depth of wound probed and visualized     Wound extent: no foreign bodies/material noted     Contaminated: no   Treatment:    Area cleansed with:  Saline   Amount of cleaning:  Standard   Irrigation solution:  Sterile saline Skin repair:    Repair method:  Sutures   Suture size:  4-0   Suture material:  Prolene   Suture technique:  Simple interrupted  Number of sutures:  9 Approximation:    Approximation:  Close    Vermilion border: well-aligned   Post-procedure details:    Dressing:  Antibiotic ointment and sterile dressing   Patient tolerance of procedure:  Tolerated well, no immediate complications   (including critical care time)    Medications Ordered in ED Medications  lidocaine-EPINEPHrine (XYLOCAINE W/EPI) 2 %-1:200000 (PF) injection 20 mL (not administered)  oxyCODONE-acetaminophen (PERCOCET/ROXICET) 5-325 MG per tablet (1 tablet  Given 03/22/17 2243)     Initial Impression / Assessment and Plan / ED Course  I have reviewed the triage vital signs and the nursing notes.  Pertinent labs & imaging results that were available during my care of the patient were reviewed by me and considered in my medical decision making (see chart for details).  56 year old with right leg laceration. It was repaired and irrigated in the ED. Bottom of the wound visualized and bleeding controlled. 9 sutures placed. Wound care discussed and advised to return to have stitches removed in 7-10 days. Tdap was updated. Return precautions discussed.   Final Clinical Impressions(s) / ED Diagnoses   Final diagnoses:  Laceration of right lower extremity, initial encounter    New Prescriptions New Prescriptions   No medications on file     Iris Pert 03/23/17 0113    Davonna Belling, MD 03/25/17 1537

## 2017-03-23 NOTE — ED Notes (Signed)
Dressed repaired laceration to R leg. Reviewed d/c information with pt and SO, who voiced understanding and had no additional questions. Pt departed in NAD.

## 2017-03-23 NOTE — Discharge Instructions (Signed)
Keep area clean and dry. You can wash with soap and water after 24 hours. Do not scrub Apply antibiotic ointment to wound when you change the dressing. Change bandage at least once daily, more if it is dirty Watch for signs of infection (redness, drainage) Have stitches removed in 7-10 days

## 2017-04-10 ENCOUNTER — Ambulatory Visit: Payer: BC Managed Care – PPO | Admitting: Internal Medicine

## 2017-04-11 ENCOUNTER — Encounter: Payer: BC Managed Care – PPO | Attending: Internal Medicine | Admitting: Internal Medicine

## 2017-04-11 DIAGNOSIS — X58XXXD Exposure to other specified factors, subsequent encounter: Secondary | ICD-10-CM | POA: Insufficient documentation

## 2017-04-11 DIAGNOSIS — R7303 Prediabetes: Secondary | ICD-10-CM | POA: Diagnosis not present

## 2017-04-11 DIAGNOSIS — I10 Essential (primary) hypertension: Secondary | ICD-10-CM | POA: Insufficient documentation

## 2017-04-11 DIAGNOSIS — M1990 Unspecified osteoarthritis, unspecified site: Secondary | ICD-10-CM | POA: Diagnosis not present

## 2017-04-11 DIAGNOSIS — J45909 Unspecified asthma, uncomplicated: Secondary | ICD-10-CM | POA: Diagnosis not present

## 2017-04-11 DIAGNOSIS — S81811D Laceration without foreign body, right lower leg, subsequent encounter: Secondary | ICD-10-CM | POA: Insufficient documentation

## 2017-04-11 DIAGNOSIS — L4059 Other psoriatic arthropathy: Secondary | ICD-10-CM | POA: Diagnosis not present

## 2017-04-12 NOTE — Progress Notes (Addendum)
[name], Natasha Lyons ([id number]) Visit Report for 04/11/2017 Allergy List Details Patient Name: Natasha Lyons, Natasha Lyons. Date of Service: 04/11/2017 8:00 AM Medical Record Number: [id number] Patient Account Number: [id number] Date of Birth/Sex: [birth year] (56 y.o. Female) Treating RN: Montey Hora Primary Care June Rode: Leeroy Cha Other Clinician: Referring Demetry Bendickson: Leeroy Cha Treating Joann Kulpa/Extender: Ricard Dillon Weeks in Treatment: 0 Allergies Active Allergies No Known Drug Allergies Allergy Notes Electronic Signature(s) Signed: 04/11/2017 4:26:30 PM By: Montey Hora Entered By: Montey Hora on 04/11/2017 08:24:50 Sciulli, Gus Puma ([id number]) -------------------------------------------------------------------------------- Arrival Information Details Patient Name: Natasha Lyons. Date of Service: 04/11/2017 8:00 AM Medical Record Number: [id number] Patient Account Number: [id number] Date of Birth/Sex: 12-Jun-1961 (56 y.o. Female) Treating RN: Montey Hora Primary Care Rees Santistevan: Leeroy Cha Other Clinician: Referring Elbie Statzer: Leeroy Cha Treating Abbey Veith/Extender: Tito Dine in Treatment: 0 Visit Information Patient Arrived: Cane Arrival Time: 08:13 Accompanied By: self Transfer Assistance: None Patient Identification Verified: Yes Secondary Verification Process Completed: Yes Electronic Signature(s) Signed: 04/11/2017 4:26:30 PM By: Montey Hora Entered By: Montey Hora on 04/11/2017 08:14:04 Jobst, Gus Puma ([id number]) -------------------------------------------------------------------------------- Clinic Level of Care Assessment Details Patient Name: Natasha Lyons. Date of Service: 04/11/2017 8:00 AM Medical Record Number: [id number] Patient Account Number: [id number] Date of Birth/Sex: [birth year] (56 y.o. Female) Treating RN: Montey Hora Primary Care Bryleigh Ottaway:  Leeroy Cha Other Clinician: Referring Shantanu Strauch: Leeroy Cha Treating Karmella Bouvier/Extender: Tito Dine in Treatment: 0 Clinic Level of Care Assessment Items TOOL 1 Quantity Score []  - Use when EandM and Procedure is performed on INITIAL visit 0 ASSESSMENTS - Nursing Assessment / Reassessment X - General Physical Exam (combine w/ comprehensive assessment (listed just below) when 1 20 performed on new pt. evals) X- 1 25 Comprehensive Assessment (HX, ROS, Risk Assessments, Wounds Hx, etc.) ASSESSMENTS - Wound and Skin Assessment / Reassessment []  - Dermatologic / Skin Assessment (not related to wound area) 0 ASSESSMENTS - Ostomy and/or Continence Assessment and Care []  - Incontinence Assessment and Management 0 []  - 0 Ostomy Care Assessment and Management (repouching, etc.) PROCESS - Coordination of Care X - Simple Patient / Family Education for ongoing care 1 15 []  - 0 Complex (extensive) Patient / Family Education for ongoing care X- 1 10 Staff obtains Programmer, systems, Records, Test Results / Process Orders []  - 0 Staff telephones HHA, Nursing Homes / Clarify orders / etc []  - 0 Routine Transfer to another Facility (non-emergent condition) []  - 0 Routine Hospital Admission (non-emergent condition) X- 1 15 New Admissions / Biomedical engineer / Ordering NPWT, Apligraf, etc. []  - 0 Emergency Hospital Admission (emergent condition) PROCESS - Special Needs []  - Pediatric / Minor Patient Management 0 []  - 0 Isolation Patient Management []  - 0 Hearing / Language / Visual special needs []  - 0 Assessment of Community assistance (transportation, D/C planning, etc.) []  - 0 Additional assistance / Altered mentation []  - 0 Support Surface(s) Assessment (bed, cushion, seat, etc.) Cullins, AMUTHANJALEE S. ([id number]) INTERVENTIONS - Miscellaneous []  - External ear exam 0 []  - 0 Patient Transfer (multiple staff / Civil Service fast streamer / Similar devices) []  -  0 Simple Staple / Suture removal (25 or less) []  - 0 Complex Staple / Suture removal (26 or more) []  - 0 Hypo/Hyperglycemic Management (do not check if billed separately) X- 1 15 Ankle / Brachial Index (ABI) - do not check if billed separately Has the patient been seen at the hospital within the last three years: Yes Total Score: 100 Level Of Care: New/Established - Level 3  Electronic Signature(s) Signed: 04/11/2017 4:26:30 PM By: Montey Hora Entered By: Montey Hora on 04/11/2017 09:52:22 Alabi, Gus Puma ([id number]) -------------------------------------------------------------------------------- Encounter Discharge Information Details Patient Name: Natasha Lyons. Date of Service: 04/11/2017 8:00 AM Medical Record Number: [id number] Patient Account Number: [id number] Date of Birth/Sex: Nov 15, 1960 (57 y.o. Female) Treating RN: Montey Hora Primary Care Tushar Enns: Leeroy Cha Other Clinician: Referring Laela Deviney: Leeroy Cha Treating Samora Jernberg/Extender: Tito Dine in Treatment: 0 Encounter Discharge Information Items Discharge Pain Level: 0 Discharge Condition: Stable Ambulatory Status: Cane Discharge Destination: Home Transportation: Private Auto Accompanied By: self Schedule Follow-up Appointment: Yes Medication Reconciliation completed and No provided to Patient/Care Siah Kannan: Provided on Clinical Summary of Care: 04/11/2017 Form Type Recipient Paper Patient AL Electronic Signature(s) Signed: 04/11/2017 4:17:02 PM By: Ruthine Dose Entered By: Ruthine Dose on 04/11/2017 09:02:02 Weisse, Gus Puma ([id number]) -------------------------------------------------------------------------------- Lower Extremity Assessment Details Patient Name: Natasha Lyons. Date of Service: 04/11/2017 8:00 AM Medical Record Number: [id number] Patient Account Number: [id number] Date of Birth/Sex: 1960/10/18 (56 y.o.  Female) Treating RN: Montey Hora Primary Care Mckenna Gamm: Leeroy Cha Other Clinician: Referring Vidyuth Belsito: Leeroy Cha Treating Jalesa Thien/Extender: Ricard Dillon Weeks in Treatment: 0 Edema Assessment Assessed: [Left: No] [Right: No] Edema: [Left: N] [Right: o] Vascular Assessment Pulses: Dorsalis Pedis Palpable: [Right:Yes] Doppler Audible: [Right:Yes] Posterior Tibial Palpable: [Right:Yes] Doppler Audible: [Right:Yes] Extremity colors, hair growth, and conditions: Extremity Color: [Right:Normal] Hair Growth on Extremity: [Right:Yes] Temperature of Extremity: [Right:Warm] Capillary Refill: [Right:< 3 seconds] Blood Pressure: Brachial: [Right:128] Dorsalis Pedis: [Left:Dorsalis Pedis: 142] Ankle: Posterior Tibial: [Left:Posterior Tibial: 710] [Right:1.11] Toe Nail Assessment Left: Right: Thick: Yes Discolored: No Deformed: No Improper Length and Hygiene: No Electronic Signature(s) Signed: 04/11/2017 4:26:30 PM By: Montey Hora Entered By: Montey Hora on 04/11/2017 08:38:56 Castille, Gus Puma ([id number]) -------------------------------------------------------------------------------- Multi Wound Chart Details Patient Name: Natasha Lyons. Date of Service: 04/11/2017 8:00 AM Medical Record Number: [id number] Patient Account Number: [id number] Date of Birth/Sex: 15-Apr-1961 (57 y.o. Female) Treating RN: Montey Hora Primary Care Anasophia Pecor: Leeroy Cha Other Clinician: Referring Joury Allcorn: Leeroy Cha Treating Yovanna Cogan/Extender: Ricard Dillon Weeks in Treatment: 0 Vital Signs Height(in): 64 Pulse(bpm): 80 Weight(lbs): 241 Blood Pressure(mmHg): 136/72 Body Mass Index(BMI): 41 Temperature(F): 97.7 Respiratory Rate 18 (breaths/min): Photos: [1:No Photos] [N/A:N/A] Wound Location: [1:Right Lower Leg - Anterior] [N/A:N/A] Wounding Event: [1:Trauma] [N/A:N/A] Primary Etiology: [1:Trauma, Other]  [N/A:N/A] Comorbid History: [1:Asthma, Hypertension, Osteoarthritis] [N/A:N/A] Date Acquired: [1:03/22/2017] [N/A:N/A] Weeks of Treatment: [1:0] [N/A:N/A] Wound Status: [1:Open] [N/A:N/A] Measurements L x W x D [1:2.1x0.6x0.1] [N/A:N/A] (cm) Area (cm) : [1:0.99] [N/A:N/A] Volume (cm) : [1:0.099] [N/A:N/A] Classification: [1:Full Thickness Without Exposed Support Structures] [N/A:N/A] Exudate Amount: [1:Large] [N/A:N/A] Exudate Type: [1:Serous] [N/A:N/A] Exudate Color: [1:amber] [N/A:N/A] Wound Margin: [1:Flat and Intact] [N/A:N/A] Granulation Amount: [1:Large (67-100%)] [N/A:N/A] Granulation Quality: [1:Pink] [N/A:N/A] Necrotic Amount: [1:Small (1-33%)] [N/A:N/A] Exposed Structures: [1:Fat Layer (Subcutaneous Tissue) Exposed: Yes Fascia: No Tendon: No Muscle: No Joint: No Bone: No] [N/A:N/A] Epithelialization: [1:Small (1-33%)] [N/A:N/A] Debridement: [1:Open Wound/Selective ([id number]) - Selective] [N/A:N/A] Pre-procedure [1:08:44] [N/A:N/A] Verification/Time Out Taken: Pain Control: [1:Lidocaine 4% Topical Solution] [N/A:N/A] Tissue Debrided: [1:Fibrin/Slough] [N/A:N/A] Level: [1:Non-Viable Tissue] [N/A:N/A] Debridement Area (sq cm): 1.26 N/A N/A Instrument: Curette N/A N/A Bleeding: Minimum N/A N/A Hemostasis Achieved: Pressure N/A N/A Procedural Pain: 0 N/A N/A Post Procedural Pain: 0 N/A N/A Debridement Treatment Procedure was tolerated well N/A N/A Response: Post Debridement 2.1x0.6x0.2 N/A N/A Measurements L x W x D (cm) Post Debridement Volume: 0.198 N/A N/A (cm) Periwound Skin Texture: Excoriation: No N/A N/A Induration: No Callus:  No Crepitus: No Rash: No Scarring: No Periwound Skin Moisture: Maceration: No N/A N/A Dry/Scaly: No Periwound Skin Color: Atrophie Blanche: No N/A N/A Cyanosis: No Ecchymosis: No Erythema: No Hemosiderin Staining: No Mottled: No Pallor: No Rubor: No Temperature: No Abnormality N/A N/A Tenderness on Palpation: Yes  N/A N/A Wound Preparation: Ulcer Cleansing: N/A N/A Rinsed/Irrigated with Saline Topical Anesthetic Applied: Other: lidocaine 4% Procedures Performed: Debridement N/A N/A Treatment Notes Electronic Signature(s) Signed: 04/11/2017 4:23:55 PM By: Linton Ham MD Entered By: Linton Ham on 04/11/2017 08:54:35 Junio, Gus Puma ([id number]) -------------------------------------------------------------------------------- [place] Details Patient Name: Natasha Lyons. Date of Service: 04/11/2017 8:00 AM Medical Record Number: [id number] Patient Account Number: [id number] Date of Birth/Sex: 1961/01/05 (56 y.o. Female) Treating RN: Montey Hora Primary Care Santanna Olenik: Leeroy Cha Other Clinician: Referring Jailine Lieder: Leeroy Cha Treating Cy Bresee/Extender: Tito Dine in Treatment: 0 Active Inactive Electronic Signature(s) Signed: 05/04/2017 3:53:42 PM By: Gretta Cool, BSN, RN, CWS, Kim RN, BSN Signed: 06/11/2017 12:38:39 PM By: Montey Hora Previous Signature: 04/11/2017 4:26:30 PM Version By: Montey Hora Entered By: Gretta Cool BSN, RN, CWS, Kim on 05/04/2017 15:53:42 Coldren, Gus Puma ([id number]) -------------------------------------------------------------------------------- Pain Assessment Details Patient Name: EMONIE, ESPERICUETA. Date of Service: 04/11/2017 8:00 AM Medical Record Number: [id number] Patient Account Number: [id number] Date of Birth/Sex: 09/24/1960 (56 y.o. Female) Treating RN: Montey Hora Primary Care Roshawn Lacina: Leeroy Cha Other Clinician: Referring Nesha Counihan: Leeroy Cha Treating Lott Seelbach/Extender: Ricard Dillon Weeks in Treatment: 0 Active Problems Location of Pain Severity and Description of Pain Patient Has Paino Yes Site Locations Pain Location: Pain in Ulcers With Dressing Change: Yes Duration of the Pain. Constant / Intermittento Constant Pain Management  and Medication Current Pain Management: Notes Topical or injectable lidocaine is offered to patient for acute pain when surgical debridement is performed. If needed, Patient is instructed to use over the counter pain medication for the following 24-48 hours after debridement. Wound care MDs do not prescribed pain medications. Patient has chronic pain or uncontrolled pain. Patient has been instructed to make an appointment with their Primary Care Physician for pain management. Electronic Signature(s) Signed: 04/11/2017 4:26:30 PM By: Montey Hora Entered By: Montey Hora on 04/11/2017 08:14:18 Barrasso, Gus Puma ([id number]) -------------------------------------------------------------------------------- Patient/Caregiver Education Details Patient Name: Natasha Lyons Date of Service: 04/11/2017 8:00 AM Medical Record Number: [id number] Patient Account Number: [id number] Date of Birth/Gender: [birth year] (56 y.o. Female) Treating RN: Montey Hora Primary Care Physician: Leeroy Cha Other Clinician: Referring Physician: Leeroy Cha Treating Physician/Extender: Tito Dine in Treatment: 0 Education Assessment Education Provided To: Patient Education Topics Provided Wound/Skin Impairment: Handouts: Other: wound care as ordered Methods: Demonstration, Explain/Verbal Responses: State content correctly Electronic Signature(s) Signed: 04/11/2017 4:26:30 PM By: Montey Hora Entered By: Montey Hora on 04/11/2017 08:46:30 Messina, Gus Puma ([id number]) -------------------------------------------------------------------------------- Wound Assessment Details Patient Name: Natasha Lyons. Date of Service: 04/11/2017 8:00 AM Medical Record Number: [id number] Patient Account Number: [id number] Date of Birth/Sex: Sep 27, 1960 (56 y.o. Female) Treating RN: Montey Hora Primary Care Chelle Cayton: Leeroy Cha Other  Clinician: Referring Tregan Read: Leeroy Cha Treating Kathleene Bergemann/Extender: Ricard Dillon Weeks in Treatment: 0 Wound Status Wound Number: 1 Primary Etiology: Trauma, Other Wound Location: Right Lower Leg - Anterior Wound Status: Open Wounding Event: Trauma Comorbid History: Asthma, Hypertension, Osteoarthritis Date Acquired: 03/22/2017 Weeks Of Treatment: 0 Clustered Wound: No Photos Photo Uploaded By: Montey Hora on 04/11/2017 11:56:11 Wound Measurements Length: (cm) 2.1 Width: (cm) 0.6 Depth: (cm) 0.1 Area: (cm) 0.99 Volume: (cm) 0.099 % Reduction in Area: % Reduction in  Volume: Epithelialization: Small (1-33%) Tunneling: No Undermining: No Wound Description Full Thickness Without Exposed Support Classification: Structures Wound Margin: Flat and Intact Exudate Large Amount: Exudate Type: Serous Exudate Color: amber Foul Odor After Cleansing: No Slough/Fibrino Yes Wound Bed Granulation Amount: Large (67-100%) Exposed Structure Granulation Quality: Pink Fascia Exposed: No Necrotic Amount: Small (1-33%) Fat Layer (Subcutaneous Tissue) Exposed: Yes Necrotic Quality: Adherent Slough Tendon Exposed: No Muscle Exposed: No Joint Exposed: No Bone Exposed: No Storr, AMUTHANJALEE S. ([id number]) Periwound Skin Texture Texture Color No Abnormalities Noted: No No Abnormalities Noted: No Callus: No Atrophie Blanche: No Crepitus: No Cyanosis: No Excoriation: No Ecchymosis: No Induration: No Erythema: No Rash: No Hemosiderin Staining: No Scarring: No Mottled: No Pallor: No Moisture Rubor: No No Abnormalities Noted: No Dry / Scaly: No Temperature / Pain Maceration: No Temperature: No Abnormality Tenderness on Palpation: Yes Wound Preparation Ulcer Cleansing: Rinsed/Irrigated with Saline Topical Anesthetic Applied: Other: lidocaine 4%, Electronic Signature(s) Signed: 04/11/2017 4:26:30 PM By: Montey Hora Entered By: Montey Hora on  04/11/2017 08:32:32 Hageman, Gus Puma ([id number]) -------------------------------------------------------------------------------- [place] Details Patient Name: Natasha Lyons. Date of Service: 04/11/2017 8:00 AM Medical Record Number: [id number] Patient Account Number: [id number] Date of Birth/Sex: [birth year] (56 y.o. Female) Treating RN: Montey Hora Primary Care Iyad Deroo: Leeroy Cha Other Clinician: Referring Yarelin Reichardt: Leeroy Cha Treating Lindie Roberson/Extender: Ricard Dillon Weeks in Treatment: 0 Vital Signs Time Taken: 08:14 Temperature (F): 97.7 Height (in): 64 Pulse (bpm): 80 Source: Measured Respiratory Rate (breaths/min): 18 Weight (lbs): 241 Blood Pressure (mmHg): 136/72 Source: Measured Reference Range: 80 - 120 mg / dl Body Mass Index (BMI): 41.4 Electronic Signature(s) Signed: 04/11/2017 4:26:30 PM By: Montey Hora Entered By: Montey Hora on 04/11/2017 08:16:53

## 2017-04-12 NOTE — Progress Notes (Signed)
ALLE, DIFABIO ([id number]) Visit Report for 04/11/2017 Abuse/Suicide Risk Screen Details Patient Name: Natasha Lyons, Natasha Lyons. Date of Service: 04/11/2017 8:00 AM Medical Record Patient Account Number: [id number] Number: Treating RN: Montey Hora Date of Birth/Sex: 10-Dec-1960 (56 y.o. Female) Other Clinician: [place], Treating ROBSON, MICHAEL Meggie Laseter: RUPASHREE Scottie Metayer/Extender: Renae Fickle, Referring Aileana Hodder: Jake Samples in Treatment: 0 Abuse/Suicide Risk Screen Items Answer ABUSE/SUICIDE RISK SCREEN: Has anyone close to you tried to hurt or harm you recentlyo No Do you feel uncomfortable with anyone in your familyo No Has anyone forced you do things that you didnot want to doo No Do you have any thoughts of harming yourselfo No Patient displays signs or symptoms of abuse and/or neglect. No Electronic Signature(s) Signed: 04/11/2017 4:26:30 PM By: Montey Hora Entered By: Montey Hora on 04/11/2017 08:22:46 Peralta, Gus Puma ([id number]) -------------------------------------------------------------------------------- Activities of Daily Living Details Patient Name: Natasha Lyons. Date of Service: 04/11/2017 8:00 AM Medical Record Patient Account Number: [id number] Number: Treating RN: Montey Hora Date of Birth/Sex: [birth year] (56 y.o. Female) Other Clinician: [place], Treating ROBSON, MICHAEL Chakita Mcgraw: RUPASHREE Grayton Lobo/Extender: Renae Fickle, Referring Savana Spina: Jake Samples in Treatment: 0 Activities of Daily Living Items Answer Activities of Daily Living (Please select one for each item) Drive Automobile Completely Able Take Medications Completely Able Use Telephone Completely Able Care for Appearance Completely Able Use Toilet Completely Able Bath / Shower Completely Able Dress Self Completely Able Feed Self Completely Able Walk Completely Able Get In / Out Bed  Completely Able Housework Completely Able Prepare Meals Completely Able Handle Money Completely Able Shop for Self Completely Able Electronic Signature(s) Signed: 04/11/2017 4:26:30 PM By: Montey Hora Entered By: Montey Hora on 04/11/2017 08:23:05 Harth, Gus Puma ([id number]) -------------------------------------------------------------------------------- Education Assessment Details Patient Name: Natasha Lyons. Date of Service: 04/11/2017 8:00 AM Medical Record Patient Account Number: [id number] Number: Treating RN: Montey Hora Date of Birth/Sex: Sep 12, 1960 (56 y.o. Female) Other Clinician: Primary Care VARADARAJAN, Treating ROBSON, MICHAEL Dickey Caamano: RUPASHREE Kilah Drahos/Extender: Renae Fickle, Referring Haide Klinker: Jake Samples in Treatment: 0 Primary Learner Assessed: Patient Learning Preferences/Education Level/Primary Language Learning Preference: Explanation, Demonstration Highest Education Level: College or Above Preferred Language: English Cognitive Barrier Assessment/Beliefs Language Barrier: No Translator Needed: No Memory Deficit: No Emotional Barrier: No Cultural/Religious Beliefs Affecting Medical No Care: Physical Barrier Assessment Impaired Vision: No Impaired Hearing: No Decreased Hand dexterity: No Knowledge/Comprehension Assessment Knowledge Level: Medium Comprehension Level: Medium Ability to understand written Medium instructions: Ability to understand verbal Medium instructions: Motivation Assessment Anxiety Level: Calm Cooperation: Cooperative Education Importance: Acknowledges Need Interest in Health Problems: Asks Questions Perception: Coherent Willingness to Engage in Self- Medium Management Activities: Medium CHAUNTELLE, AZPEITIA ([id number]) Readiness to Engage in Self- Management Activities: Electronic Signature(s) Signed: 04/11/2017 4:26:30 PM By: Montey Hora Entered By: Montey Hora on  04/11/2017 08:23:35 Hermida, Gus Puma ([id number]) -------------------------------------------------------------------------------- Fall Risk Assessment Details Patient Name: Natasha Lyons. Date of Service: 04/11/2017 8:00 AM Medical Record Patient Account Number: [id number] Number: Treating RN: Montey Hora Date of Birth/Sex: 05/23/1961 (56 y.o. Female) Other Clinician: [place], Treating ROBSON, MICHAEL Shaquoia Miers: RUPASHREE Sarajane Fambrough/Extender: Renae Fickle, Referring Myia Bergh: Jake Samples in Treatment: 0 Fall Risk Assessment Items Have you had 2 or more falls in the last 12 monthso 0 No Have you had any fall that resulted in injury in the last 12 monthso 0 Yes FALL RISK ASSESSMENT: History of falling - immediate or within 3 months 25 Yes Secondary diagnosis 0 No Ambulatory aid None/bed rest/wheelchair/nurse 0 Yes  Crutches/cane/walker 0 No Furniture 0 No IV Access/Saline Lock 0 No Gait/Training Normal/bed rest/immobile 0 Yes Weak 0 No Impaired 0 No Mental Status Oriented to own ability 0 Yes Electronic Signature(s) Signed: 04/11/2017 4:26:30 PM By: Montey Hora Entered By: Montey Hora on 04/11/2017 08:23:52 Mysliwiec, Gus Puma ([id number]) -------------------------------------------------------------------------------- Foot Assessment Details Patient Name: Natasha Lyons. Date of Service: 04/11/2017 8:00 AM Medical Record Patient Account Number: [id number] Number: Treating RN: Montey Hora Date of Birth/Sex: 1961/03/21 (56 y.o. Female) Other Clinician: [place], Treating ROBSON, MICHAEL Porter Moes: RUPASHREE Daniella Dewberry/Extender: Renae Fickle, Referring Domnick Chervenak: Jake Samples in Treatment: 0 Foot Assessment Items Site Locations + = Sensation present, - = Sensation absent, C = Callus, U = Ulcer R = Redness, W = Warmth, M = Maceration, PU = Pre-ulcerative lesion F = Fissure, S =  Swelling, D = Dryness Assessment Right: Left: Other Deformity: No No Prior Foot Ulcer: No No Prior Amputation: No No Charcot Joint: No No Ambulatory Status: Ambulatory Without Help Gait: Steady Electronic Signature(s) Signed: 04/11/2017 4:26:30 PM By: Montey Hora Entered By: Montey Hora on 04/11/2017 08:24:18 Zawislak, Gus Puma ([id number]) Hoaglin, Gus Puma ([id number]) -------------------------------------------------------------------------------- Nutrition Risk Assessment Details Patient Name: Natasha Lyons. Date of Service: 04/11/2017 8:00 AM Medical Record Patient Account Number: [id number] Number: Treating RN: Montey Hora Date of Birth/Sex: 1960-12-18 (56 y.o. Female) Other Clinician: [place], Treating ROBSON, MICHAEL Pansy Ostrovsky: RUPASHREE Jevon [place]/Extender: Renae Fickle, Referring Darnella Zeiter: Jake Samples in Treatment: 0 Height (in): 64 Weight (lbs): 241 Body Mass Index (BMI): 41.4 Nutrition Risk Assessment Items NUTRITION RISK SCREEN: I have an illness or condition that made me change the kind and/or 0 No amount of food I eat I eat fewer than two meals per day 0 No I eat few fruits and vegetables, or milk products 0 No I have three or more drinks of beer, liquor or wine almost every day 0 No I have tooth or mouth problems that make it hard for me to eat 0 No I don't always have enough money to buy the food I need 0 No I eat alone most of the time 0 No I take three or more different prescribed or over-the-counter drugs a 1 Yes day Without wanting to, I have lost or gained 10 pounds in the last six 0 No months I am not always physically able to shop, cook and/or feed myself 0 No Nutrition Protocols Good Risk Protocol 0 No interventions needed Moderate Risk Protocol Electronic Signature(s) Signed: 04/11/2017 4:26:30 PM By: Montey Hora Entered By: Montey Hora on 04/11/2017 08:24:02

## 2017-04-12 NOTE — Progress Notes (Signed)
Natasha Lyons ([id number]) Visit Report for 04/11/2017 Chief Complaint Document Details Patient Name: Natasha Lyons, Natasha Lyons. Date of Service: 04/11/2017 8:00 AM Medical Record Patient Account Number: [id number] Number: Treating RN: Montey Hora Date of Birth/Sex: 08-07-1960 (56 y.o. Female) Other Clinician: [place], Treating ROBSON, MICHAEL Provider: RUPASHREE Provider/Extender: Renae Fickle, Referring Provider: Jake Samples in Treatment: 0 Information Obtained from: Patient Chief Complaint 04/11/17; patient is here for a laceration injury on the right anterior lower leg that was sustained on 03/22/17 Electronic Signature(s) Signed: 04/11/2017 4:23:55 PM By: Linton Ham MD Entered By: Linton Ham on 04/11/2017 08:56:09 [place], Gus Puma ([id number]) -------------------------------------------------------------------------------- Debridement Details Patient Name: Natasha Lyons. Date of Service: 04/11/2017 8:00 AM Medical Record Patient Account Number: [id number] Number: Treating RN: Montey Hora Date of Birth/Sex: 26-Mar-1961 (56 y.o. Female) Other Clinician: [place], Treating ROBSON, MICHAEL Provider: RUPASHREE Provider/Extender: Renae Fickle, Referring Provider: Jake Samples in Treatment: 0 Debridement Performed for Wound #1 Right,Anterior Lower Leg Assessment: Performed By: Physician Ricard Dillon, MD Debridement: Open Wound/Selective Debridement Selective Description: Pre-procedure Verification/Time Out Yes - 08:44 Taken: Start Time: 08:44 Pain Control: Lidocaine 4% Topical Solution Level: Non-Viable Tissue Total Area Debrided (L x 2.1 (cm) x 0.6 (cm) = 1.26 (cm) W): Tissue and other Non-Viable, Fibrin/Slough material debrided: Instrument: Curette Bleeding: Minimum Hemostasis Achieved: Pressure End Time: 08:47 Procedural Pain: 0 Post Procedural Pain:  0 Response to Treatment: Procedure was tolerated well Post Debridement Measurements of Total Wound Length: (cm) 2.1 Width: (cm) 0.6 Depth: (cm) 0.2 Volume: (cm) 0.198 Character of Wound/Ulcer Post Improved Debridement: Post Procedure Diagnosis Same as Pre-procedure Electronic Signature(s) MARIKAY, ROADS ([id number]) Signed: 04/11/2017 4:23:55 PM By: Linton Ham MD Signed: 04/11/2017 4:26:30 PM By: Montey Hora Entered By: Linton Ham on 04/11/2017 08:55:42 Croson, Gus Puma ([id number]) -------------------------------------------------------------------------------- HPI Details Patient Name: Natasha Lyons. Date of Service: 04/11/2017 8:00 AM Medical Record Patient Account Number: [id number] Number: Treating RN: Montey Hora Date of Birth/Sex: [birth year] (56 y.o. 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 She was seen in the emergency room and had 9 sutures placed to close the wound. The sutures were removed 8 days ago. The patient is concerned because the wound is still in a nonhealing state and she was here for our review of this. She has been using topical Neosporin. She is listed as a prediabetic but does not know her previous hemoglobin A1c and is not on any current treatment. She does have a history of psoriasis and psoriatic arthritis. She tells me at one point she had extensive psoriasis on this part of her right anterior leg. Her psoriatic arthritis involves her neck shoulders and bilateral knees. Her symptoms have more recently been controlled with methotrexate and Cosentyx. She did place the  methotrexate on hold when she got this wound. She is not on systemic steroids. She also has asthma ABI in this clinic at 1.11. Electronic Signature(s) Signed: 04/11/2017 4:23:55 PM By: Linton Ham MD Entered By: Linton Ham on 04/11/2017 09:02:20 Antonucci, Gus Puma ([id number]) -------------------------------------------------------------------------------- Physical Exam Details Patient Name: Natasha Lyons. Date of Service: 04/11/2017 8:00 AM Medical Record Patient Account Number: [id number] Number: Treating RN: Montey Hora Date of Birth/Sex: 1960/10/24 (56 y.o. Female) Other Clinician:  Primary Care VARADARAJAN, Treating ROBSON, MICHAEL Provider: RUPASHREE Provider/Extender: Renae Fickle, Referring Provider: Jake Samples in Treatment: 0 Constitutional Sitting or standing Blood Pressure is within target range for patient.. Pulse regular and within target range for patient.Marland Kitchen Respirations regular, non-labored and within target range.. Temperature is normal and within the target range for the patient.Marland Kitchen appears in no distress. Eyes Conjunctivae clear. No discharge. Respiratory Respiratory effort is easy and symmetric bilaterally. Rate is normal at rest and on room air.. Bilateral breath sounds are clear and equal in all lobes with no wheezes, rales or rhonchi.. Cardiovascular Heart rhythm and rate regular, without murmur or gallop.. Femoral arteries without bruits and pulses strong.. Pedal pulses palpable and strong bilaterally.. There is no edema issues in her lower legs.. Lymphatic Nonpalpable in the popliteal or inguinal area. Integumentary (Hair, Skin) In spite of the worrisome history, there is no skin issues in her bilateral lower extremities that should affect healing specifically not her right leg. Neurological Normal vibration and light touch. Patient is complaining of episodic shooting pains in the wound however there is no evidence of  nerve injury around this. Psychiatric No evidence of depression, anxiety, or agitation. Calm, cooperative, and communicative. Appropriate interactions and affect.. Notes Wound exam; vertical linear wound on the right anterior lower leg. Most of this I think it already progress towards healing. She did have surface eschar superiorly in the mid aspect of the wound that I removed with a #3 curet there was no bleeding. She tolerated this reasonably well. Under bright light allotted the wound areas already epithelialized. There is nothing that suggests ongoing infection or other worrisome issues here. I think this should progress towards healing Electronic Signature(s) Signed: 04/11/2017 4:23:55 PM By: Linton Ham MD Entered By: Linton Ham on 04/11/2017 09:04:52 [place], Gus Puma ([id number]) Natasha Lyons ([id number]) -------------------------------------------------------------------------------- Physician Orders Details Patient Name: Natasha Lyons. Date of Service: 04/11/2017 8:00 AM Medical Record Patient Account Number: [id number] Number: Treating RN: Montey Hora Date of Birth/Sex: [birth year] (56 y.o. Female) Other Clinician: [place], Treating ROBSON, MICHAEL Provider: RUPASHREE Provider/Extender: Renae Fickle, Referring Provider: Jake Samples in Treatment: 0 Verbal / Phone Orders: No Diagnosis Coding Wound Cleansing Wound #1 Right,Anterior Lower Leg o Clean wound with Normal Saline. o May Shower, gently pat wound dry prior to applying new dressing. Anesthetic Wound #1 Right,Anterior Lower Leg o Topical Lidocaine 4% cream applied to wound bed prior to debridement Primary Wound Dressing Wound #1 Right,Anterior Lower Leg o Prisma Ag Secondary Dressing Wound #1 Right,Anterior Lower Leg o Dry Gauze o Boardered Foam Dressing Dressing Change Frequency Wound #1 Right,Anterior Lower Leg o Change  dressing every day. Follow-up Appointments Wound #1 Right,Anterior Lower Leg o Return Appointment in 1 week. Additional Orders / Instructions Wound #1 Right,Anterior Lower Leg o Increase protein intake. o Other: - Please add vitamin A, vitamin C and zinc supplements to your diet KIRAH, STICE ([id number]) Electronic Signature(s) Signed: 04/11/2017 4:23:55 PM By: Linton Ham MD Signed: 04/11/2017 4:26:30 PM By: Montey Hora Entered By: Montey Hora on 04/11/2017 08:51:10 Myrick, Gus Puma ([id number]) -------------------------------------------------------------------------------- Problem List Details Patient Name: Natasha Lyons. Date of Service: 04/11/2017 8:00 AM Medical Record Patient Account Number: [id number] Number: Treating RN: Montey Hora Date of Birth/Sex: 05-Feb-1961 (56 y.o. Female) Other Clinician: [place], Treating ROBSON, MICHAEL Provider: RUPASHREE Provider/Extender: Renae Fickle, Referring Provider: Jake Samples in Treatment: 0 Active Problems ICD-10 Encounter Code Description Active Date Diagnosis S81.811D Laceration without foreign body, right lower leg,  04/11/2017 Yes subsequent encounter L40.59 Other psoriatic arthropathy 04/11/2017 Yes Inactive Problems Resolved Problems Electronic Signature(s) Signed: 04/11/2017 4:23:55 PM By: Linton Ham MD Entered By: Linton Ham on 04/11/2017 08:54:27 [place], Gus Puma ([id number]) -------------------------------------------------------------------------------- Progress Note Details Patient Name: Natasha Lyons. Date of Service: 04/11/2017 8:00 AM Medical Record Patient Account Number: [id number] Number: Treating RN: Montey Hora Date of Birth/Sex: 01-16-1961 (56 y.o. 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 She was seen in the emergency room and had 9 sutures placed to close the wound. The sutures were removed 8 days ago. The patient is concerned because the wound is still in a nonhealing state and she was here for our review of this. She has been using topical Neosporin. She is listed as a prediabetic but does not know her previous hemoglobin A1c and is not on any current treatment. She does have a history of psoriasis and psoriatic arthritis. She tells me at one point she had extensive psoriasis on this part of her right anterior leg. Her psoriatic arthritis involves her neck shoulders and bilateral knees. Her symptoms have more recently been controlled with methotrexate and Cosentyx. She did place the methotrexate on hold when she got this wound. She is not on systemic steroids. She also has asthma ABI in this clinic at 1.11. Wound History Patient presents with 1 open wound that has been present for approximately 3 weeks. Patient has been treating wound in the following manner: neosporin. Laboratory tests have not been performed in the last month. Patient reportedly has not tested positive for an antibiotic resistant organism. Patient reportedly has not tested positive for osteomyelitis. Patient reportedly has not had testing performed to evaluate circulation in the legs. Patient History Information obtained from Patient. Allergies No Known Drug Allergies Family History MAUREEN, DELATTE ([id number]) Diabetes - Mother, Heart Disease - Mother, Hypertension - Mother, Lung Disease -  Father, No family history of Cancer, Hereditary Spherocytosis, Kidney Disease, Seizures, Stroke, Thyroid Problems, Tuberculosis. Social History Never smoker, Marital Status - Married, Alcohol Use - Never, Drug Use - No History, Caffeine Use - Daily. Medical History Respiratory Patient has history of Asthma Cardiovascular Patient has history of Hypertension Musculoskeletal Patient has history of Osteoarthritis Medical And Surgical History Notes Endocrine pre diabetic - no treatment Musculoskeletal psoriatic arthritis Review of Systems (ROS) Constitutional Symptoms (General Health) The patient has no complaints or symptoms. Eyes The patient has no complaints or symptoms. Ear/Nose/Mouth/Throat The patient has no complaints or symptoms. Hematologic/Lymphatic The patient has no complaints or symptoms. Respiratory The patient has no complaints or symptoms. Cardiovascular The patient has no complaints or symptoms. Gastrointestinal The patient has no complaints or symptoms. Endocrine The patient has no complaints or symptoms. Genitourinary The patient has no complaints or symptoms. Immunological The patient has no complaints or symptoms. Integumentary (Skin) The patient has no complaints or symptoms. Neurologic The patient has no complaints or symptoms. Oncologic The patient has no complaints or symptoms. Psychiatric The patient has no complaints or symptoms. THAILA, BOTTOMS ([id number]) Objective Constitutional Sitting or standing Blood Pressure is within target range for patient.Marland Kitchen  Pulse regular and within target range for patient.Marland Kitchen Respirations regular, non-labored and within target range.. Temperature is normal and within the target range for the patient.Marland Kitchen appears in no distress. Vitals Time Taken: 8:14 AM, Height: 64 in, Source: Measured, Weight: 241 lbs, Source: Measured, BMI: 41.4, Temperature: 97.7 F, Pulse: 80 bpm, Respiratory Rate: 18 breaths/min, Blood  Pressure: 136/72 mmHg. Eyes Conjunctivae clear. No discharge. Respiratory Respiratory effort is easy and symmetric bilaterally. Rate is normal at rest and on room air.. Bilateral breath sounds are clear and equal in all lobes with no wheezes, rales or rhonchi.. Cardiovascular Heart rhythm and rate regular, without murmur or gallop.. Femoral arteries without bruits and pulses strong.. Pedal pulses palpable and strong bilaterally.. There is no edema issues in her lower legs.. Lymphatic Nonpalpable in the popliteal or inguinal area. Neurological Normal vibration and light touch. Patient is complaining of episodic shooting pains in the wound however there is no evidence of nerve injury around this. Psychiatric No evidence of depression, anxiety, or agitation. Calm, cooperative, and communicative. Appropriate interactions and affect.. General Notes: Wound exam; vertical linear wound on the right anterior lower leg. Most of this I think it already progress towards healing. She did have surface eschar superiorly in the mid aspect of the wound that I removed with a #3 curet there was no bleeding. She tolerated this reasonably well. Under bright light allotted the wound areas already epithelialized. There is nothing that suggests ongoing infection or other worrisome issues here. I think this should progress towards healing Integumentary (Hair, Skin) [place], AMUTHANJALEE S. ([id number]) In spite of the worrisome history, there is no skin issues in her bilateral lower extremities that should affect healing specifically not her right leg. Wound #1 status is Open. Original cause of wound was Trauma. The wound is located on the Right,Anterior Lower Leg. The wound measures 2.1cm length x 0.6cm width x 0.1cm depth; 0.99cm^2 area and 0.099cm^3 volume. There is Fat Layer (Subcutaneous Tissue) Exposed exposed. There is no tunneling or undermining noted. There is a large amount of serous drainage noted. The  wound margin is flat and intact. There is large (67-100%) pink granulation within the wound bed. There is a small (1-33%) amount of necrotic tissue within the wound bed including Adherent Slough. The periwound skin appearance did not exhibit: Callus, Crepitus, Excoriation, Induration, Rash, Scarring, Dry/Scaly, Maceration, Atrophie Blanche, Cyanosis, Ecchymosis, Hemosiderin Staining, Mottled, Pallor, Rubor, Erythema. Periwound temperature was noted as No Abnormality. The periwound has tenderness on palpation. Assessment Active Problems ICD-10 S81.811D - Laceration without foreign body, right lower leg, subsequent encounter L40.59 - Other psoriatic arthropathy Procedures Wound #1 Pre-procedure diagnosis of Wound #1 is a Trauma, Other located on the Right,Anterior Lower Leg . There was a Non-Viable Tissue Open Wound/Selective [phone]) debridement with total area of 1.26 sq cm performed by Ricard Dillon, MD. with the following instrument(s): Curette to remove Non-Viable tissue/material including Fibrin/Slough after achieving pain control using Lidocaine 4% Topical Solution. A time out was conducted at 08:44, prior to the start of the procedure. A Minimum amount of bleeding was controlled with Pressure. The procedure was tolerated well with a pain level of 0 throughout and a pain level of 0 following the procedure. Post Debridement Measurements: 2.1cm length x 0.6cm width x 0.2cm depth; 0.198cm^3 volume. Character of Wound/Ulcer Post Debridement is improved. Post procedure Diagnosis Wound #1: Same as Pre-Procedure Plan Aguilera, AMUTHANJALEE S. ([id number]) Wound Cleansing: Wound #1 Right,Anterior Lower Leg: Clean wound with Normal Saline. May Shower, gently pat  wound dry prior to applying new dressing. Anesthetic: Wound #1 Right,Anterior Lower Leg: Topical Lidocaine 4% cream applied to wound bed prior to debridement Primary Wound Dressing: Wound #1 Right,Anterior Lower Leg: Prisma  Ag Secondary Dressing: Wound #1 Right,Anterior Lower Leg: Dry Gauze Boardered Foam Dressing Dressing Change Frequency: Wound #1 Right,Anterior Lower Leg: Change dressing every day. Follow-up Appointments: Wound #1 Right,Anterior Lower Leg: Return Appointment in 1 week. Additional Orders / Instructions: Wound #1 Right,Anterior Lower Leg: Increase protein intake. Other: - Please add vitamin A, vitamin C and zinc supplements to your diet #1 silver collagen hydrogel border foam change dressing every 2d #2 there is nothing with regards to her underlying psoriasis that looks like this shouldn't inhibit her wound healing at this point. Headache is probably reasonable to go back on her methotrexate #3 in spite of the patient's complaints of pain around the wound I see no evidence of infection or significant nerve injury. This really looks as though it's going to progress towards healing Electronic Signature(s) Signed: 04/11/2017 4:23:55 PM By: Linton Ham MD Entered By: Linton Ham on 04/11/2017 09:06:19 [place], Gus Puma ([id number]) -------------------------------------------------------------------------------- ROS/PFSH Details Patient Name: Natasha Lyons. Date of Service: 04/11/2017 8:00 AM Medical Record Patient Account Number: [id number] Number: Treating RN: Montey Hora Date of Birth/Sex: [birth year] (56 y.o. Female) Other Clinician: Primary Care VARADARAJAN, Treating ROBSON, [place] Provider: RUPASHREE Provider/Extender: Renae Fickle, Referring Provider: Jake Samples in Treatment: 0 Information Obtained From Patient Wound History Do you currently have one or more open woundso Yes How many open wounds do you currently haveo 1 Approximately how long have you had your woundso 3 weeks How have you been treating your wound(s) until nowo neosporin Has your wound(s) ever healed and then re-openedo No Have you had any lab work done in the past  montho No Have you tested positive for an antibiotic resistant organism (MRSA, VRE)o No Have you tested positive for osteomyelitis (bone infection)o No Have you had any tests for circulation on your legso No Constitutional Symptoms (General Health) Complaints and Symptoms: No Complaints or Symptoms Eyes Complaints and Symptoms: No Complaints or Symptoms Ear/Nose/Mouth/Throat Complaints and Symptoms: No Complaints or Symptoms Hematologic/Lymphatic Complaints and Symptoms: No Complaints or Symptoms Respiratory Complaints and Symptoms: No Complaints or Symptoms Medical History: DEZTINY, SARRA ([id number]) Positive for: Asthma Cardiovascular Complaints and Symptoms: No Complaints or Symptoms Medical History: Positive for: Hypertension Gastrointestinal Complaints and Symptoms: No Complaints or Symptoms Endocrine Complaints and Symptoms: No Complaints or Symptoms Medical History: Past Medical History Notes: pre diabetic - no treatment Genitourinary Complaints and Symptoms: No Complaints or Symptoms Immunological Complaints and Symptoms: No Complaints or Symptoms Integumentary (Skin) Complaints and Symptoms: No Complaints or Symptoms Musculoskeletal Medical History: Positive for: Osteoarthritis Past Medical History Notes: psoriatic arthritis Neurologic Complaints and Symptoms: No Complaints or Symptoms Oncologic AHLANI, WICKES ([id number]) Complaints and Symptoms: No Complaints or Symptoms Psychiatric Complaints and Symptoms: No Complaints or Symptoms Immunizations Pneumococcal Vaccine: Received Pneumococcal Vaccination: No Immunization Notes: up to date Implantable Devices Family and Social History Cancer: No; Diabetes: Yes - Mother; Heart Disease: Yes - Mother; Hereditary Spherocytosis: No; Hypertension: Yes - Mother; Kidney Disease: No; Lung Disease: Yes - Father; Seizures: No; Stroke: No; Thyroid Problems: No; Tuberculosis: No; Never  smoker; Marital Status - Married; Alcohol Use: Never; Drug Use: No History; Caffeine Use: Daily; Financial Concerns: No; Food, Clothing or Shelter Needs: No; Support System Lacking: No; Transportation Concerns: No; Advanced Directives: No; Patient does not want information on Visual merchandiser  Signature(s) Signed: 04/11/2017 4:23:55 PM By: Linton Ham MD Signed: 04/11/2017 4:26:30 PM By: Montey Hora Entered By: Montey Hora on 04/11/2017 08:22:36 [place], Gus Puma ([id number]) -------------------------------------------------------------------------------- [place] Details Patient Name: Natasha Lyons. Date of Service: 04/11/2017 Medical Record Patient Account Number: [id number] Number: Treating RN: Montey Hora Date of Birth/Sex: 10-25-1960 (56 y.o. Female) Other Clinician: [place], Treating ROBSON, MICHAEL Provider: RUPASHREE Provider/Extender: Renae Fickle, Weeks in Treatment: 0 Referring Provider: RUPASHREE Diagnosis Coding ICD-10 Codes Code Description S81.811D Laceration without foreign body, right lower leg, subsequent encounter L40.59 Other psoriatic arthropathy Facility Procedures CPT4 Code Description: [id number] [place] VISIT-LEV 3 EST PT Modifier: Quantity: 1 CPT4 Code Description: [id number] - DEBRIDE WOUND 1ST 20 SQ CM OR < ICD-10 Description Diagnosis S81.811D Laceration without foreign body, right lower leg, Modifier: subsequent en Quantity: 1 counter Physician Procedures CPT4 Code Description: [id number] WC PHYS LEVEL 3 o NEW PT ICD-10 Description Diagnosis S81.811D Laceration without foreign body, right lower leg, s L40.59 Other psoriatic arthropathy Modifier: 25 ubsequent en Quantity: 1 counter CPT4 Code Description: [id number] 62947 - WC PHYS DEBR WO ANESTH 20 SQ CM ICD-10 Description Diagnosis S81.811D Laceration without foreign body, right lower leg, s Modifier: ubsequent en Quantity: 1  counter Electronic Signature(s) Signed: 04/11/2017 4:23:55 PM By: Linton Ham MD Signed: 04/11/2017 4:26:30 PM By: Montey Hora Entered By: Montey Hora on 04/11/2017 09:52:30

## 2017-04-18 ENCOUNTER — Ambulatory Visit: Payer: BC Managed Care – PPO | Admitting: Internal Medicine

## 2017-04-20 ENCOUNTER — Other Ambulatory Visit (INDEPENDENT_AMBULATORY_CARE_PROVIDER_SITE_OTHER): Payer: BC Managed Care – PPO

## 2017-04-20 DIAGNOSIS — R7303 Prediabetes: Secondary | ICD-10-CM | POA: Diagnosis not present

## 2017-04-20 LAB — BASIC METABOLIC PANEL
BUN: 10 mg/dL (ref 6–23)
CO2: 29 mEq/L (ref 19–32)
Calcium: 8.6 mg/dL (ref 8.4–10.5)
Chloride: 106 mEq/L (ref 96–112)
Creatinine, Ser: 0.52 mg/dL (ref 0.40–1.20)
GFR: 157.82 mL/min (ref >60.00)
Glucose, Bld: 130 mg/dL — ABNORMAL HIGH (ref 70–99)
Potassium: 4.4 mEq/L (ref 3.5–5.1)
Sodium: 142 mEq/L (ref 135–145)

## 2017-04-20 LAB — HEMOGLOBIN A1C: Hgb A1c MFr Bld: 6.4 % (ref 4.6–6.5)

## 2017-04-23 ENCOUNTER — Other Ambulatory Visit: Payer: BC Managed Care – PPO

## 2017-04-25 ENCOUNTER — Ambulatory Visit: Payer: BC Managed Care – PPO | Admitting: Internal Medicine

## 2017-04-26 ENCOUNTER — Ambulatory Visit: Payer: BC Managed Care – PPO | Admitting: Endocrinology

## 2017-05-02 ENCOUNTER — Ambulatory Visit (INDEPENDENT_AMBULATORY_CARE_PROVIDER_SITE_OTHER): Payer: BC Managed Care – PPO | Admitting: Endocrinology

## 2017-05-02 ENCOUNTER — Encounter: Payer: Self-pay | Admitting: Endocrinology

## 2017-05-02 VITALS — BP 118/82 | HR 78 | Ht 64.5 in | Wt 248.2 lb

## 2017-05-02 DIAGNOSIS — R7303 Prediabetes: Secondary | ICD-10-CM

## 2017-05-02 NOTE — Progress Notes (Signed)
Patient ID: Natasha Lyons, female   DOB: December 17, 1960, 57 y.o.   MRN: [id number]   History of Present Illness:   1. OBESITY:  Because of failure of diet and exercise to help her lose weight she was on Qsymia in 2014 with excellent results and significant weight loss. Her maximum weight has been about 258 in the past. She stopped Qsymia in 10/14 and initially was able to continue losing weight with lifestyle changes. She was back on Qsymia in 11/15 and this was stopped previously on her own  She had gained back about 40 pounds since her last visit 2 years ago She was started on Belviq on her last visit in 7/18 because Qsymia was not covered She has not been able to watch her diet or exercise This is mostly because of her new job situation, increased stress and stress eating as well as fatigue from various reasons She does not think her hunger has been curbed  She has gained about 9 pounds since July   Wt Readings from Last 3 Encounters:  05/02/17 248 lb 3.2 oz (112.6 kg)  03/22/17 237 lb (107.5 kg)  01/24/17 239 lb (108.4 kg)     Prediabetes:  She had been on metformin previously since 2009 with mostly impaired fasting glucose and normal A1c but she has stopped taking this on her own.  Recent fasting glucose is back up to 130 although this was done late morning A1c is upper normal at 6.4, has been as low as 5.2 previously  As above she has gained weight  She does have a meter at home but not checking   Lab Results  Component Value Date   HGBA1C 6.4 04/20/2017    3. Vitamin D deficiency: She is taking vitamin D 50,000 units weekly, This has been continued long-term Her Last level is about 40     Allergies as of 05/02/2017      Reactions   Dust Mite Extract Shortness Of Breath   Grass Extracts [gramineae Pollens] Shortness Of Breath   Tree Extract Shortness Of Breath   Certain tree allergies      Medication List       Accurate as of 05/02/17  4:48  PM. Always use your most recent med list.          amLODipine 5 MG tablet Commonly known as:  NORVASC Take 5 mg by mouth daily.   Azelastine-Fluticasone 137-50 MCG/ACT Susp Commonly known as:  DYMISTA 2 sprays per nostril 1-2 times a day   BAYER CONTOUR NEXT TEST test strip Generic drug:  glucose blood USE AS INSTRUCTED TO CHECK BLOOD SUGARS ONCE A DAY   glucose blood test strip Use to check blood sugar once daily   BIOFREEZE ROLL-ON COLORLESS 4 % Gel Generic drug:  Menthol (Topical Analgesic) Apply 1-2 application topically 4 (four) times daily as needed (for pain.).   Cinnamon 500 MG Tabs Take 500 mg by mouth daily.   COSENTYX 300 DOSE 150 MG/ML Sosy Generic drug:  Secukinumab Inject 300 mg into the skin every 30 (thirty) days.   Dexlansoprazole 30 MG capsule Take 30 mg by mouth daily.   folic acid 762 MCG tablet Commonly known as:  FOLVITE Take 800-1,600 mcg by mouth as directed. 1600 mcg on Sundays and 800 mcg on all other days.   levocetirizine 5 MG tablet Commonly known as:  XYZAL Take 1 tablet (5 mg total) by mouth every evening.   Lorcaserin HCl ER 20 MG  Tb24 Commonly known as:  BELVIQ XR Take 20 mg by mouth daily.   metFORMIN 500 MG 24 hr tablet Commonly known as:  GLUCOPHAGE-XR TAKE 2 TABLETS (1,000 MG TOTAL) BY MOUTH DAILY WITH SUPPER.   methocarbamol 500 MG tablet Commonly known as:  ROBAXIN Take 500 mg by mouth 4 (four) times daily as needed for muscle spasms.   methotrexate 2.5 MG tablet Commonly known as:  RHEUMATREX Take 20 mg by mouth every Sunday. Caution:Chemotherapy. Protect from light.   multivitamin with minerals Tabs tablet Take 1 tablet by mouth daily.   polyvinyl alcohol 1.4 % ophthalmic solution Commonly known as:  LIQUIFILM TEARS Place 1-2 drops into both eyes 3 (three) times daily as needed for dry eyes.   albuterol (2.5 MG/3ML) 0.083% nebulizer solution Commonly known as:  PROVENTIL Take 2.5 mg by nebulization every 6  (six) hours as needed for wheezing or shortness of breath.   PROVENTIL HFA 108 (90 Base) MCG/ACT inhaler Generic drug:  albuterol Inhale 2 puffs into the lungs every 6 (six) hours as needed for wheezing.   SINGULAIR 10 MG tablet Generic drug:  montelukast Take 10 mg by mouth at bedtime.   traMADol 50 MG tablet Commonly known as:  ULTRAM Take 1 tablet (50 mg total) by mouth every 6 (six) hours as needed.   Turmeric 500 MG Tabs Take 500 mg by mouth daily.   venlafaxine XR 75 MG 24 hr capsule Commonly known as:  EFFEXOR-XR TAKE 1 CAPSULE (75 MG TOTAL) BY MOUTH DAILY   Vitamin D (Ergocalciferol) 50000 units Caps capsule Commonly known as:  DRISDOL Take 1 capsule (50,000 Units total) by mouth every 7 (seven) days.       Allergies:  Allergies  Allergen Reactions  . Dust Mite Extract Shortness Of Breath  . Grass Extracts [Gramineae Pollens] Shortness Of Breath  . Tree Extract Shortness Of Breath    Certain tree allergies    Past Medical History:  Diagnosis Date  . Allergy    takes Zyrtec daily  . Arthritis    psorriatric arthritis  . Asthma    Albuterol as needed as well as neb as needed  . Chronic back pain    pinched nerve  . Cough   . Family history of adverse reaction to anesthesia    pts dad gets sick with anesthesia  . Fibromyalgia    takes Effexor daily  . Headache    seasonal   . History of bronchitis 2016  . History of shingles   . Hypertension    takes Amlodipine daily  . Joint pain   . Joint swelling   . Muscle spasm    takes Robaxin as needed  . Osteoarthritis   . Pneumonia    hx of-10+ yrs ago    Past Surgical History:  Procedure Laterality Date  . APPENDECTOMY    . BIOPSY THYROID  2014   results benign  . CHOLECYSTECTOMY  2008  . DIAGNOSTIC LAPAROSCOPY     d/c endometriosis  . DILATION AND CURETTAGE OF UTERUS    . INSERTION OF MESH N/A 06/15/2016   Procedure: INSERTION OF MESH;  Surgeon: Autumn Messing III, MD;  Location: [facility];  Service:  General;  Laterality: N/A;  . KNEE ARTHROSCOPY Bilateral 2011  . MASS EXCISION Right 04/21/2014   Procedure: EXCISION OF RIGHT BACK MASS;  Surgeon: Coralie Keens, MD;  Location: [facility];  Service: General;  Laterality: Right;  . TONSILLECTOMY    . VENTRAL HERNIA  REPAIR  06/15/2016  . VENTRAL HERNIA REPAIR N/A 06/15/2016   Procedure: LAPAROSCOPIC VENTRAL HERNIA WITH MESH;  Surgeon: Autumn Messing III, MD;  Location: [facility];  Service: General;  Laterality: N/A;    Family History  Problem Relation Age of Onset  . Diabetes Mother   . Hypertension Mother     Social History:  reports that she has never smoked. She has never used smokeless tobacco. She reports that she drinks alcohol. She reports that she does not use drugs.  Review of Systems    History of hypertension:  She had been on treatment for the last few years and Previously was taken off treatment when she had lost weight She is back on 5 mg amlodipine from her PCP  Has had history of severe psoriasis with arthritis and is on treatment with methotrexate Still having significant pain  She is asking about difficulty with healing wounds   LIPIDS:   Lab Results  Component Value Date   CHOL 144 01/22/2017   HDL 52.90 01/22/2017   LDLCALC 81 01/22/2017   TRIG 50.0 01/22/2017   CHOLHDL 3 01/22/2017    THYROID NODULE:   She had mostly A cystic nodule on the left side previously This has been benign on biopsy previously She thinks it sometimes feels swollen on the left side although has only minimal discomfort  Although she had been on thyroid supplements a few years ago she has been euthyroid without any levothyroxine for some time  Lab Results  Component Value Date   TSH 1.15 01/22/2017   TSH 0.85 12/30/2014   TSH 0.63 11/04/2013   FREET4 0.74 12/30/2014   FREET4 0.89 07/28/2013   FREET4 1.12 04/11/2013      EXAM:  BP 118/82   Pulse 78   Ht 5' 4.5" (1.638 m)   Wt 248 lb 3.2 oz (112.6 kg)    SpO2 94%   BMI 41.95 kg/m   She has generalized obesity  Her thyroid is Enlarged uniformly on the left side and firm about twice normal and nontender Right side also mildly enlarged  Assessment/Plan:   1. Weight gain: She previously was  able to lose significant amount of weight with Qsymia. With not watching her diet from stress easing she needs some more effective drug Currently Belviq is not helping her She is also not able to exercise for various reasons as discussed above  Will prescribe Qsymia and get this prior authorized again  2. PREDIABETES: Her glucose is 130 fasting, has been previously borderline diabetic at 125  Again A1c is higher than usual at 6.4  She did not resume metformin as directed on her last visit Also has gained weight, discussed weight loss measures and using Qsymia now   She can go back to 1000 mg of metformin daily, she can take this in the evenings  He needs to start checking fasting blood sugars at home since she is unable to come to the lab early in the morning because of her work  Needs follow-up in 3 months with A1c  3.  HYPERTENSION: Her blood pressure is controlled with current regimen and followed by PCP  4.  History of thyroid nodule/goiter: Clinically stable and still euthyroid.   Likely she had a multinodular goiter and reassured her that there is no significant change         Natasha Lyons 05/02/2017, 4:48 PM   Note: This office note was prepared with Estate agent. Any transcriptional errors that  result from this process are unintentional.

## 2017-05-02 NOTE — Patient Instructions (Addendum)
Restart Metfomin and exercise

## 2017-05-03 MED ORDER — PHENTERMINE-TOPIRAMATE ER 3.75-23 MG PO CP24: ORAL_CAPSULE | ORAL | 0 refills | Status: DC

## 2017-06-05 ENCOUNTER — Other Ambulatory Visit: Payer: Self-pay | Admitting: Family Medicine

## 2017-06-07 ENCOUNTER — Other Ambulatory Visit: Payer: Self-pay | Admitting: Allergy & Immunology

## 2017-06-21 ENCOUNTER — Ambulatory Visit: Payer: BC Managed Care – PPO | Admitting: Family Medicine

## 2017-07-17 ENCOUNTER — Ambulatory Visit: Payer: BC Managed Care – PPO | Admitting: Family Medicine

## 2017-08-02 ENCOUNTER — Other Ambulatory Visit: Payer: BC Managed Care – PPO

## 2017-08-07 ENCOUNTER — Ambulatory Visit: Payer: BC Managed Care – PPO | Admitting: Endocrinology

## 2017-08-08 ENCOUNTER — Emergency Department (HOSPITAL_COMMUNITY)
Admission: EM | Admit: 2017-08-08 | Discharge: 2017-08-08 | Disposition: A | Discharge status: Home / Self Care | Payer: BC Managed Care – PPO | Attending: Emergency Medicine | Admitting: Emergency Medicine

## 2017-08-08 ENCOUNTER — Emergency Department (HOSPITAL_COMMUNITY): Payer: BC Managed Care – PPO

## 2017-08-08 ENCOUNTER — Encounter (HOSPITAL_COMMUNITY): Payer: Self-pay | Admitting: Emergency Medicine

## 2017-08-08 ENCOUNTER — Other Ambulatory Visit: Payer: Self-pay

## 2017-08-08 DIAGNOSIS — M62830 Muscle spasm of back: Secondary | ICD-10-CM | POA: Diagnosis not present

## 2017-08-08 DIAGNOSIS — I10 Essential (primary) hypertension: Secondary | ICD-10-CM | POA: Insufficient documentation

## 2017-08-08 DIAGNOSIS — R0602 Shortness of breath: Secondary | ICD-10-CM | POA: Diagnosis not present

## 2017-08-08 DIAGNOSIS — J45909 Unspecified asthma, uncomplicated: Secondary | ICD-10-CM | POA: Insufficient documentation

## 2017-08-08 DIAGNOSIS — R0789 Other chest pain: Secondary | ICD-10-CM | POA: Insufficient documentation

## 2017-08-08 DIAGNOSIS — Z79899 Other long term (current) drug therapy: Secondary | ICD-10-CM | POA: Insufficient documentation

## 2017-08-08 LAB — CBC
HCT: 43.3 % (ref 36.0–46.0)
Hemoglobin: 14 g/dL (ref 12.0–15.0)
MCH: 26.7 pg (ref 26.0–34.0)
MCHC: 32.3 g/dL (ref 30.0–36.0)
MCV: 82.5 fL (ref 78.0–100.0)
Platelets: 293 10*3/uL (ref 150–400)
RBC: 5.25 MIL/uL — ABNORMAL HIGH (ref 3.87–5.11)
RDW: 15.2 % (ref 11.5–15.5)
WBC: 10.5 10*3/uL (ref 4.0–10.5)

## 2017-08-08 LAB — BASIC METABOLIC PANEL
Anion gap: 11 (ref 5–15)
BUN: 9 mg/dL (ref 6–20)
CO2: 23 mmol/L (ref 22–32)
Calcium: 9.3 mg/dL (ref 8.9–10.3)
Chloride: 105 mmol/L (ref 101–111)
Creatinine, Ser: 0.56 mg/dL (ref 0.44–1.00)
GFR calc Af Amer: >60 mL/min (ref >60)
GFR calc non Af Amer: >60 mL/min (ref >60)
Glucose, Bld: 127 mg/dL — ABNORMAL HIGH (ref 65–99)
Potassium: 3.9 mmol/L (ref 3.5–5.1)
Sodium: 139 mmol/L (ref 135–145)

## 2017-08-08 LAB — I-STAT TROPONIN, ED
Troponin i, poc: 0 ng/mL (ref 0.00–0.08)
Troponin i, poc: 0 ng/mL (ref 0.00–0.08)

## 2017-08-08 LAB — I-STAT BETA HCG BLOOD, ED (MC, WL, AP ONLY): I-stat hCG, quantitative: <5 m[IU]/mL (ref <5)

## 2017-08-08 LAB — D-DIMER, QUANTITATIVE: D-Dimer, Quant: 0.31 ug/mL-FEU (ref 0.00–0.50)

## 2017-08-08 MED ORDER — DIAZEPAM 5 MG PO TABS
5.0000 mg | ORAL_TABLET | Freq: Once | ORAL | Status: AC
  Administered 2017-08-08: 5 mg via ORAL
  Filled 2017-08-08: qty 1

## 2017-08-08 MED ORDER — NAPROXEN 500 MG PO TABS: 500.0000 mg | ORAL_TABLET | Freq: Two times a day (BID) | ORAL | 0 refills | Status: DC

## 2017-08-08 MED ORDER — IOPAMIDOL (ISOVUE-370) INJECTION 76%
INTRAVENOUS | Status: AC
  Administered 2017-08-08: 100 mL
  Filled 2017-08-08: qty 100

## 2017-08-08 MED ORDER — METHOCARBAMOL 500 MG PO TABS: 500.0000 mg | ORAL_TABLET | Freq: Two times a day (BID) | ORAL | 0 refills | Status: DC

## 2017-08-08 MED ORDER — KETOROLAC TROMETHAMINE 30 MG/ML IJ SOLN
30.0000 mg | Freq: Once | INTRAMUSCULAR | Status: AC
Start: 2017-08-08 — End: 2017-08-08
  Administered 2017-08-08: 30 mg via INTRAVENOUS
  Filled 2017-08-08: qty 1

## 2017-08-08 MED ORDER — FENTANYL CITRATE (PF) 100 MCG/2ML IJ SOLN
50.0000 ug | Freq: Once | INTRAMUSCULAR | Status: AC
  Administered 2017-08-08: 50 ug via INTRAVENOUS
  Filled 2017-08-08: qty 2

## 2017-08-08 NOTE — ED Notes (Signed)
Assumed care on pt. , pt. endorses central chest pain radiating to upper back , mild SOB , no nausea or diaphoresis , pain increases with movement and changing positions .

## 2017-08-08 NOTE — ED Triage Notes (Signed)
Pt reports onset of L sided back pain that is radiating into L chest. Pt reports pain is worse with movement and with deep breath. Tight, 10/10.

## 2017-08-08 NOTE — ED Provider Notes (Signed)
[facility] EMERGENCY DEPARTMENT Provider Note   CSN: [id number] Arrival date & time: 08/08/17  0301     History   Chief Complaint Chief Complaint  Patient presents with  . Chest Pain  . Back Pain    HPI Natasha Lyons is a 57 y.o. female.  patient reports pain between her shoulder blades intermittent for the past week that has become progressively worse over the past 2 days.  She reports pain in the center of her back that radiates to her chest.  The pain is progressively worsening.  It is worse with movement and deep breathing.  She came in tonight because the pain in her chest became severe.  Patient reports a history of psoriatic arthritis and asthma.  Patient has never had this pain in the past.  Denies any falls or trauma.  No shortness of breath, nausea or vomiting.   The history is provided by the patient.  Chest Pain   Associated symptoms include back pain and shortness of breath. Pertinent negatives include no abdominal pain, no nausea and no vomiting.  Back Pain   Associated symptoms include chest pain. Pertinent negatives include no abdominal pain and no dysuria.    Past Medical History:  Diagnosis Date  . Allergy    takes Zyrtec daily  . Arthritis    psorriatric arthritis  . Asthma    Albuterol as needed as well as neb as needed  . Chronic back pain    pinched nerve  . Cough   . Family history of adverse reaction to anesthesia    pts dad gets sick with anesthesia  . Fibromyalgia    takes Effexor daily  . Headache    seasonal   . History of bronchitis 2016  . History of shingles   . Hypertension    takes Amlodipine daily  . Joint pain   . Joint swelling   . Muscle spasm    takes Robaxin as needed  . Osteoarthritis   . Pneumonia    hx of-10+ yrs ago    Patient Active Problem List   Diagnosis Date Noted  . Ventral hernia without obstruction or gangrene 06/15/2016  . Nonallopathic lesion of cervical region 04/24/2016  .  Nonallopathic lesion of rib cage 04/24/2016  . SI (sacroiliac) joint dysfunction 03/21/2016  . Nonallopathic lesion of sacral region 03/21/2016  . Nonallopathic lesion of lumbosacral region 03/21/2016  . Nonallopathic lesion of thoracic region 03/21/2016  . 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 Psoriatic arthritis ([place]) 04/07/2015  . Chronic pain syndrome 04/07/2015  . Psoriasis 08/10/2013  . Vitamin D deficiency 08/10/2013  . Thyroid nodule 05/02/2013  . Extrinsic asthma, unspecified 03/06/2013  . Obesity 03/06/2013    Past Surgical History:  Procedure Laterality Date  . APPENDECTOMY    . BIOPSY THYROID  2014   results benign  . CHOLECYSTECTOMY  2008  . DIAGNOSTIC LAPAROSCOPY     d/c endometriosis  . DILATION AND CURETTAGE OF UTERUS    . INSERTION OF MESH N/A 06/15/2016   Procedure: INSERTION OF MESH;  Surgeon: Autumn Messing III, MD;  Location: [facility];  Service: General;  Laterality: N/A;  . KNEE ARTHROSCOPY Bilateral 2011  . MASS EXCISION Right 04/21/2014   Procedure: EXCISION OF RIGHT BACK MASS;  Surgeon: Coralie Keens, MD;  Location: [facility] [place];  Service: General;  Laterality: Right;  . TONSILLECTOMY    . VENTRAL HERNIA REPAIR  06/15/2016  . VENTRAL HERNIA REPAIR N/A  06/15/2016   Procedure: LAPAROSCOPIC VENTRAL HERNIA WITH MESH;  Surgeon: Autumn Messing III, MD;  Location: [facility];  Service: General;  Laterality: N/A;    OB History    No data available       Home Medications    Prior to Admission medications   Medication Sig Start Date End Date Taking? Authorizing Provider  albuterol (PROVENTIL HFA) 108 (90 BASE) MCG/ACT inhaler Inhale 2 puffs into the lungs every 6 (six) hours as needed for wheezing.    [provider]  albuterol (PROVENTIL) (2.5 MG/3ML) 0.083% nebulizer solution Take 2.5 mg by nebulization every 6 (six) hours as needed for wheezing or shortness of breath.    [provider]  amLODipine (NORVASC) 5 MG tablet  Take 5 mg by mouth daily.    [provider]  Azelastine-Fluticasone [facility]) 137-50 MCG/ACT SUSP 2 sprays per nostril 1-2 times a day 01/03/17   Valentina Shaggy, MD  BAYER CONTOUR NEXT TEST test strip USE AS INSTRUCTED TO CHECK BLOOD SUGARS ONCE A DAY 03/01/15   [name] Snare, MD  Cinnamon 500 MG TABS Take 500 mg by mouth daily.    [provider]  Dexlansoprazole 30 MG capsule Take 30 mg by mouth daily.    [provider]  folic acid (FOLVITE) 086 MCG tablet Take 800-1,600 mcg by mouth as directed. 1600 mcg on Sundays and 800 mcg on all other days.    [provider]  glucose blood test strip Use to check blood sugar once daily 01/24/17   [name] Snare, MD  levocetirizine (XYZAL) 5 MG tablet TAKE 1 TABLET BY MOUTH EVERY DAY IN THE EVENING 06/07/17   Valentina Shaggy, MD  Lorcaserin HCl ER (BELVIQ XR) 20 MG TB24 Take 20 mg by mouth daily. 01/31/17   [name] Snare, MD  Menthol, Topical Analgesic, (BIOFREEZE ROLL-ON COLORLESS) 4 % GEL Apply 1-2 application topically 4 (four) times daily as needed (for pain.).    [provider]  metFORMIN (GLUCOPHAGE-XR) 500 MG 24 hr tablet TAKE 2 TABLETS (1,000 MG TOTAL) BY MOUTH DAILY WITH SUPPER. Patient not taking: Reported on 01/24/2017 08/23/15   [name] Snare, MD  methocarbamol (ROBAXIN) 500 MG tablet Take 500 mg by mouth 4 (four) times daily as needed for muscle spasms.    [provider]  methotrexate (RHEUMATREX) 2.5 MG tablet Take 20 mg by mouth every Sunday. Caution:Chemotherapy. Protect from light.     [provider]  montelukast (SINGULAIR) 10 MG tablet Take 10 mg by mouth at bedtime.    [provider]  Multiple Vitamin (MULTIVITAMIN WITH MINERALS) TABS tablet Take 1 tablet by mouth daily.    [provider]  Phentermine-Topiramate [facility]) 3.75-23 MG CP24 1 tablet daily at breakfast 05/03/17   [name] Snare, MD  polyvinyl alcohol (LIQUIFILM TEARS) 1.4 % ophthalmic solution  Place 1-2 drops into both eyes 3 (three) times daily as needed for dry eyes.    [provider]  Secukinumab (COSENTYX 300 DOSE) 150 MG/ML SOSY Inject 300 mg into the skin every 30 (thirty) days.    [provider]  traMADol (ULTRAM) 50 MG tablet Take 1 tablet (50 mg total) by mouth every 6 (six) hours as needed. Patient taking differently: Take 50 mg by mouth every 6 (six) hours as needed (for pain.).  03/21/16   Lyndal Pulley, DO  Turmeric 500 MG TABS Take 500 mg by mouth daily.    [provider]  venlafaxine XR (EFFEXOR-XR) 75 MG 24  hr capsule TAKE 1 CAPSULE (75 MG TOTAL) BY MOUTH DAILY Patient not taking: Reported on 05/02/2017 11/27/16   Lyndal Pulley, DO  Vitamin D, Ergocalciferol, (DRISDOL) 50000 UNITS CAPS capsule Take 1 capsule (50,000 Units total) by mouth every 7 (seven) days. Patient taking differently: Take 50,000 Units by mouth every Sunday.  04/12/15   Lyndal Pulley, DO    Family History Family History  Problem Relation Age of Onset  . Diabetes Mother   . Hypertension Mother     Social History Social History   Tobacco Use  . Smoking status: Never Smoker  . Smokeless tobacco: Never Used  Substance Use Topics  . Alcohol use: Yes    Comment: occ  . Drug use: No     Allergies   Dust mite extract; Grass extracts [gramineae pollens]; and Tree extract   Review of Systems Review of Systems  Constitutional: Negative for activity change and appetite change.  HENT: Negative for congestion and rhinorrhea.   Respiratory: Positive for chest tightness and shortness of breath.   Cardiovascular: Positive for chest pain.  Gastrointestinal: Negative for abdominal pain, nausea and vomiting.  Genitourinary: Negative for dysuria and hematuria.  Musculoskeletal: Positive for back pain.  Skin: Negative for rash.    all other systems are negative except as noted in the HPI and PMH.    Physical Exam Updated Vital Signs BP (!) 141/77 (BP  Location: Right Arm)   Pulse 64   Temp (!) 97.5 F (36.4 C) (Oral)   Resp 16   Ht 5\' 4"  (1.626 m)   Wt 106.6 kg (235 lb)   SpO2 94%   BMI 40.34 kg/m   Physical Exam  Constitutional: She is oriented to person, place, and time. She appears well-developed and well-nourished. No distress.  Uncomfortable and tearful  HENT:  Head: Normocephalic and atraumatic.  Mouth/Throat: Oropharynx is clear and moist. No oropharyngeal exudate.  Eyes: Conjunctivae and EOM are normal. Pupils are equal, round, and reactive to light.  Neck: Normal range of motion. Neck supple.  No meningismus.  Cardiovascular: Normal rate, regular rhythm, normal heart sounds and intact distal pulses.  No murmur heard. Equal radial pulses and grip strengths.  Pulmonary/Chest: Effort normal and breath sounds normal. No respiratory distress. She exhibits tenderness.  Anterior chest pain worse with palpation  Abdominal: Soft. There is no tenderness. There is no rebound and no guarding.  Musculoskeletal: Normal range of motion. She exhibits tenderness. She exhibits no edema.  Paraspinal thoracic tenderness bilaterally  Neurological: She is alert and oriented to person, place, and time. No cranial nerve deficit. She exhibits normal muscle tone. Coordination normal.  No ataxia on finger to nose bilaterally. No pronator drift. 5/5 strength throughout. CN 2-12 intact.Equal grip strength. Sensation intact.   Skin: Skin is warm.  Psychiatric: She has a normal mood and affect. Her behavior is normal.  Nursing note and vitals reviewed.    ED Treatments / Results  Labs (all labs ordered are listed, but only abnormal results are displayed) Labs Reviewed  BASIC METABOLIC PANEL - Abnormal; Notable for the following components:      Result Value   Glucose, Bld 127 (*)    All other components within normal limits  CBC - Abnormal; Notable for the following components:   RBC 5.25 (*)    All other components within normal limits    D-DIMER, QUANTITATIVE (NOT AT [facility])  I-STAT TROPONIN, ED  I-STAT BETA HCG BLOOD, ED ([facility], [facility], [facility] ONLY)  EKG  EKG Interpretation  Date/Time:  Wednesday August 08 2017 03:15:38 EST Ventricular Rate:  66 PR Interval:    QRS Duration: 99 QT Interval:  400 QTC Calculation: 420 R Axis:   26 Text Interpretation:  Sinus rhythm No significant change was found Confirmed by Ezequiel Essex [phone]) on 08/08/2017 3:21:34 AM       Radiology Dg Chest 2 View  Result Date: 08/08/2017 CLINICAL DATA:  Left-sided back pain radiating to the left chest. Pain worse with movement and breathing. Symptoms for 2 weeks but worse tonight. EXAM: CHEST  2 VIEW COMPARISON:  07/26/2009 FINDINGS: Shallow inspiration. Normal heart size and pulmonary vascularity. No focal airspace disease or consolidation in the lungs. No blunting of costophrenic angles. No pneumothorax. Mediastinal contours appear intact. Surgical clips in the right upper quadrant. Degenerative changes in the spine. IMPRESSION: Shallow inspiration.  No evidence of active pulmonary disease. Electronically Signed   By: Lucienne Capers M.D.   On: 08/08/2017 04:48   Ct Angio Chest/abd/pel For Dissection W And/or Wo Contrast  Result Date: 08/08/2017 CLINICAL DATA:  Hervey Ard left back pain radiating to the left chest, worse with movement or breathing. EXAM: CT ANGIOGRAPHY CHEST, ABDOMEN AND PELVIS TECHNIQUE: Multidetector CT imaging through the chest, abdomen and pelvis was performed using the standard protocol during bolus administration of intravenous contrast. Multiplanar reconstructed images and MIPs were obtained and reviewed to evaluate the vascular anatomy. CONTRAST:  159mL ISOVUE-370 IOPAMIDOL (ISOVUE-370) INJECTION 76% COMPARISON:  None. FINDINGS: CTA CHEST FINDINGS Cardiovascular: Noncontrast CT images of the chest demonstrate normal caliber thoracic aorta. No aortic calcification. No evidence of intramural hematoma. Previous cholecystectomy. Images  obtained during arterial phase of contrast injection demonstrate normal caliber thoracic aorta. Motion artifact in the aortic root. No evidence of aortic dissection. Great vessel origins are patent. Normal heart size. No pericardial effusion. Central pulmonary arteries are well opacified. No evidence of significant pulmonary embolus. Mediastinum/Nodes: No significant lymphadenopathy in the chest. The esophagus is decompressed. Lungs/Pleura: Lungs are clear. No evidence of consolidation or edema. No airspace disease. No pleural effusions. No pneumothorax. Airways are patent. Musculoskeletal: No chest wall abnormality. No acute or significant osseous findings. Review of the MIP images confirms the above findings. CTA ABDOMEN AND PELVIS FINDINGS VASCULAR Aorta: Normal caliber.  No dissection or focal lesion. Celiac: Patent without evidence of aneurysm, dissection, vasculitis or significant stenosis. SMA: Patent without evidence of aneurysm, dissection, vasculitis or significant stenosis. Renals: Both renal arteries are patent without evidence of aneurysm, dissection, vasculitis, fibromuscular dysplasia or significant stenosis. IMA: Patent without evidence of aneurysm, dissection, vasculitis or significant stenosis. Inflow: Patent without evidence of aneurysm, dissection, vasculitis or significant stenosis. Veins: No obvious venous abnormality within the limitations of this arterial phase study. Review of the MIP images confirms the above findings. NON-VASCULAR Hepatobiliary: No focal liver abnormality is seen. Status post cholecystectomy. No biliary dilatation. Pancreas: Unremarkable. No pancreatic ductal dilatation or surrounding inflammatory changes. Spleen: Normal in size without focal abnormality. Adrenals/Urinary Tract: Adrenal glands are unremarkable. Kidneys are normal, without renal calculi, focal lesion, or hydronephrosis. Bladder is unremarkable. Stomach/Bowel: Stomach is within normal limits. Appendix  appears normal. No evidence of bowel wall thickening, distention, or inflammatory changes. Lymphatic: No significant lymphadenopathy. Reproductive: Uterus and bilateral adnexa are unremarkable. Other: No abdominal wall hernia or abnormality. No abdominopelvic ascites. Musculoskeletal: No acute or significant osseous findings. Review of the MIP images confirms the above findings. IMPRESSION: 1. No evidence of aneurysm or dissection involving the thoracic or abdominal aorta. No  significant large vessel occlusions. 2. No evidence of active pulmonary disease. 3. No acute process demonstrated in the abdomen or pelvis. Electronically Signed   By: Lucienne Capers M.D.   On: 08/08/2017 05:15    Procedures Procedures (including critical care time)  Medications Ordered in ED Medications  iopamidol (ISOVUE-370) 76 % injection (not administered)  fentaNYL (SUBLIMAZE) injection 50 mcg (50 mcg Intravenous Given 08/08/17 0427)     Initial Impression / Assessment and Plan / ED Course  I have reviewed the triage vital signs and the nursing notes.  Pertinent labs & imaging results that were available during my care of the patient were reviewed by me and considered in my medical decision making (see chart for details).    Patient with 1 week history of upper back pain that radiates to her chest that is progressively worsening.  EKG is nonischemic.  Pain is worse with palpation and movement.  Troponin is negative.  D-dimer is negative.  Low suspicion for ACS or pulmonary embolism.  CTA negative for aortic dissection or other pathology. Pain improved with muscle relaxers and antiinflammatories.  Suspect musculoskeletal chest pain. Pain worse with palpation and movement.  Troponin negative x2. No evidence of aortic dissection or PE. Doubt ACS with constant pain for the past 2 days.  Will treat with NSAIDs and muscle relaxers. followup with PCP. Return precautions discussed. Final Clinical Impressions(s) / ED  Diagnoses   Final diagnoses:  Atypical chest pain  Back spasm    ED Discharge Orders    None       Jarrett Albor, Annie Main, MD 08/08/17 [phone]

## 2017-08-08 NOTE — ED Notes (Signed)
Patient transported to X-ray and then to CT scan .

## 2017-08-08 NOTE — Discharge Instructions (Signed)
There is no evidence of heart attack or blood clot in the lung.  Take the medication for muscle spasms as needed.  Return to the ED if you develop new or worsening symptoms.

## 2017-08-09 ENCOUNTER — Other Ambulatory Visit: Payer: Self-pay | Admitting: Internal Medicine

## 2017-08-09 ENCOUNTER — Ambulatory Visit
Admission: RE | Admit: 2017-08-09 | Discharge: 2017-08-09 | Disposition: A | Discharge status: Home / Self Care | Payer: BC Managed Care – PPO | Source: Ambulatory Visit | Attending: Internal Medicine | Admitting: Internal Medicine

## 2017-08-09 DIAGNOSIS — M542 Cervicalgia: Secondary | ICD-10-CM

## 2017-08-17 ENCOUNTER — Other Ambulatory Visit: Payer: Self-pay | Admitting: Rheumatology

## 2017-08-17 DIAGNOSIS — M542 Cervicalgia: Secondary | ICD-10-CM

## 2017-08-24 ENCOUNTER — Ambulatory Visit
Admission: RE | Admit: 2017-08-24 | Discharge: 2017-08-24 | Disposition: A | Discharge status: Home / Self Care | Payer: BC Managed Care – PPO | Source: Ambulatory Visit | Attending: Rheumatology | Admitting: Rheumatology

## 2017-08-24 DIAGNOSIS — M542 Cervicalgia: Secondary | ICD-10-CM

## 2017-08-31 ENCOUNTER — Other Ambulatory Visit: Payer: BC Managed Care – PPO

## 2017-08-31 ENCOUNTER — Other Ambulatory Visit: Payer: Self-pay | Admitting: Endocrinology

## 2017-08-31 DIAGNOSIS — E559 Vitamin D deficiency, unspecified: Secondary | ICD-10-CM

## 2017-08-31 DIAGNOSIS — E782 Mixed hyperlipidemia: Secondary | ICD-10-CM

## 2017-08-31 DIAGNOSIS — E042 Nontoxic multinodular goiter: Secondary | ICD-10-CM

## 2017-08-31 DIAGNOSIS — R7303 Prediabetes: Secondary | ICD-10-CM

## 2017-09-03 ENCOUNTER — Ambulatory Visit: Payer: BC Managed Care – PPO | Admitting: Endocrinology

## 2017-09-15 ENCOUNTER — Other Ambulatory Visit: Payer: Self-pay | Admitting: Allergy & Immunology

## 2017-10-06 ENCOUNTER — Other Ambulatory Visit: Payer: Self-pay | Admitting: Allergy & Immunology

## 2017-10-15 ENCOUNTER — Other Ambulatory Visit: Payer: Self-pay

## 2017-10-22 ENCOUNTER — Ambulatory Visit: Payer: BC Managed Care – PPO | Admitting: Endocrinology

## 2017-12-12 ENCOUNTER — Ambulatory Visit: Payer: BC Managed Care – PPO | Admitting: Endocrinology

## 2017-12-12 DIAGNOSIS — Z0289 Encounter for other administrative examinations: Secondary | ICD-10-CM

## 2017-12-28 ENCOUNTER — Encounter: Payer: Self-pay | Admitting: Podiatry

## 2017-12-28 ENCOUNTER — Ambulatory Visit: Payer: BC Managed Care – PPO | Admitting: Podiatry

## 2017-12-28 ENCOUNTER — Ambulatory Visit (INDEPENDENT_AMBULATORY_CARE_PROVIDER_SITE_OTHER): Payer: BC Managed Care – PPO

## 2017-12-28 DIAGNOSIS — M21621 Bunionette of right foot: Secondary | ICD-10-CM | POA: Diagnosis not present

## 2017-12-28 DIAGNOSIS — M779 Enthesopathy, unspecified: Secondary | ICD-10-CM

## 2017-12-28 DIAGNOSIS — M722 Plantar fascial fibromatosis: Secondary | ICD-10-CM

## 2017-12-28 DIAGNOSIS — M2011 Hallux valgus (acquired), right foot: Secondary | ICD-10-CM

## 2017-12-28 MED ORDER — TRIAMCINOLONE ACETONIDE 10 MG/ML IJ SUSP
10.0000 mg | Freq: Once | INTRAMUSCULAR | Status: AC
  Administered 2017-12-28: 10 mg

## 2017-12-28 NOTE — Patient Instructions (Addendum)

## 2017-12-28 NOTE — Progress Notes (Signed)
Subjective:   Patient ID: Natasha Lyons, female   DOB: 57 y.o.   MRN: [id number]   HPI Patient presents stating of getting a lot of pain in the outside of my right foot and in my left arch and states the right foot it feels like the bone and she is tried wider shoes soaks without relief.  It is been hurting for at least 3 months and probably longer than that but worse over that period of time and patient has good digital perfusion and does not smoke and likes to be active   Review of Systems  All other systems reviewed and are negative.       Objective:  Physical Exam  Constitutional: She appears well-developed and well-nourished.  Cardiovascular: Intact distal pulses.  Pulmonary/Chest: Effort normal.  Musculoskeletal: Normal range of motion.  Neurological: She is alert.  Skin: Skin is warm.  Nursing note and vitals reviewed.   Neurovascular status intact muscle strength is adequate range of motion within normal limits with patient noted to have inflammation and pain around the fifth metatarsal head right with fluid buildup and inflammation pain of the left mid arch area with fluid buildup and pain with palpation.  Patient has good digital perfusion well oriented x3 and history of psoriatic arthritis     Assessment:  Acute inflammatory capsulitis with tailor's bunion deformity right and mid arch fasciitis left     Plan:  H&P conditions reviewed and today I did do careful injection around the fifth MPJ 3 mg Kenalog 5 Milgram Xylocaine and the left arch 3 mg Kenalog 5 mg Xylocaine and I placed in fascial brace left.  I advised on the possibility for surgery for the right foot depending on response  X-ray indicates that there is tailor's bunion deformity right with inflammation around the fifth metatarsal head no indication of spur or arthritis

## 2017-12-28 NOTE — Progress Notes (Signed)
   Subjective:    Patient ID: Natasha Lyons, female    DOB: 05/01/1961, 57 y.o.   MRN: [id number]  HPI    Review of Systems  All other systems reviewed and are negative.      Objective:   Physical Exam        Assessment & Plan:

## 2018-01-01 ENCOUNTER — Other Ambulatory Visit: Payer: Self-pay | Admitting: Allergy & Immunology

## 2018-01-18 ENCOUNTER — Ambulatory Visit: Payer: BC Managed Care – PPO | Admitting: Podiatry

## 2018-07-01 ENCOUNTER — Other Ambulatory Visit: Payer: Self-pay | Admitting: Otolaryngology

## 2018-07-01 DIAGNOSIS — R0989 Other specified symptoms and signs involving the circulatory and respiratory systems: Secondary | ICD-10-CM

## 2018-07-01 DIAGNOSIS — E042 Nontoxic multinodular goiter: Secondary | ICD-10-CM

## 2018-07-11 ENCOUNTER — Inpatient Hospital Stay: Admission: RE | Admit: 2018-07-11 | Payer: Self-pay | Source: Ambulatory Visit

## 2018-07-11 ENCOUNTER — Other Ambulatory Visit: Payer: Self-pay | Admitting: Otolaryngology

## 2018-07-11 ENCOUNTER — Ambulatory Visit
Admission: RE | Admit: 2018-07-11 | Discharge: 2018-07-11 | Disposition: A | Discharge status: Home / Self Care | Payer: BC Managed Care – PPO | Source: Ambulatory Visit | Attending: Otolaryngology | Admitting: Otolaryngology

## 2018-07-11 DIAGNOSIS — E042 Nontoxic multinodular goiter: Secondary | ICD-10-CM

## 2018-08-09 ENCOUNTER — Ambulatory Visit: Payer: BC Managed Care – PPO | Admitting: Podiatry

## 2018-08-15 ENCOUNTER — Ambulatory Visit: Payer: BC Managed Care – PPO | Admitting: Allergy & Immunology

## 2018-08-15 DIAGNOSIS — J309 Allergic rhinitis, unspecified: Secondary | ICD-10-CM

## 2018-09-04 ENCOUNTER — Ambulatory Visit: Payer: BC Managed Care – PPO | Admitting: Podiatry

## 2018-10-17 IMAGING — MR MR CERVICAL SPINE W/O CM
4 of 5 series · 27 of 48 positions shown · non-contrast
Comparison: None.

CLINICAL DATA: Neck pain radiating to both shoulders

EXAM:
MRI CERVICAL SPINE WITHOUT CONTRAST
TECHNIQUE: Multiplanar, multisequence MR imaging of the cervical spine was
performed. No intravenous contrast was administered.

[Series 6: T1 · sagittal · 3.0mm · 0.66mm/px · 6 of 15 slices shown]
[im 1/15]
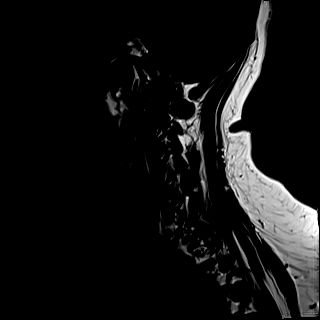
[im 3/15]
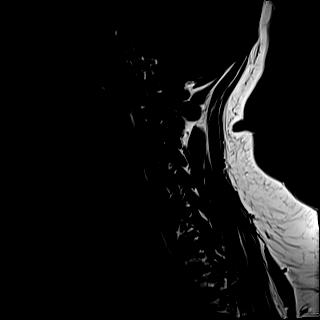
[im 6/15]
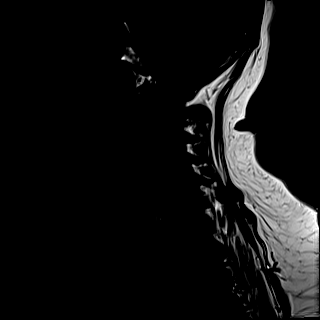
[im 9/15]
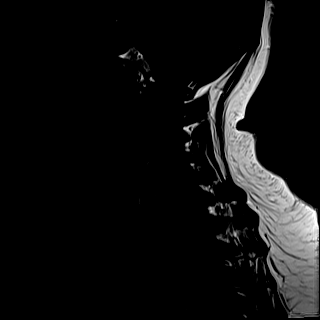
[im 12/15]
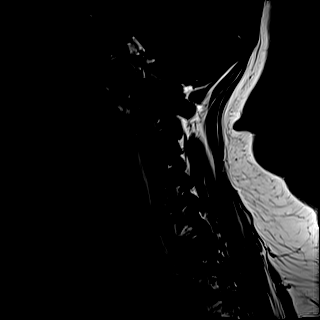
[im 15/15]
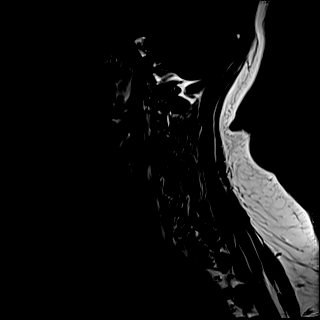

[Series 7: T2 · sagittal · 3.0mm · 0.55mm/px · 7 of 15 slices shown (1 of 2)]
[im 1/15]
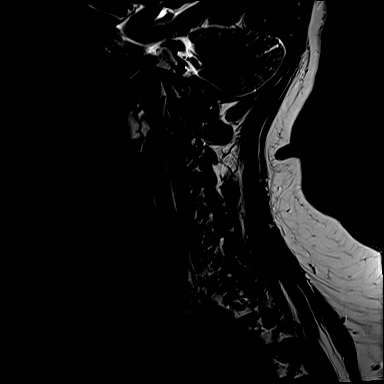
[im 3/15]
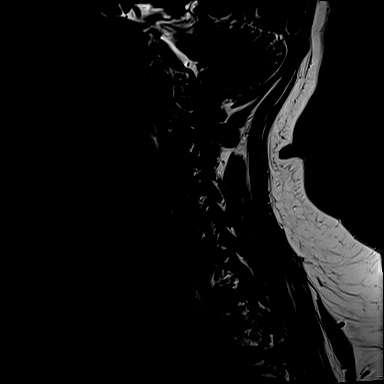
[im 5/15]
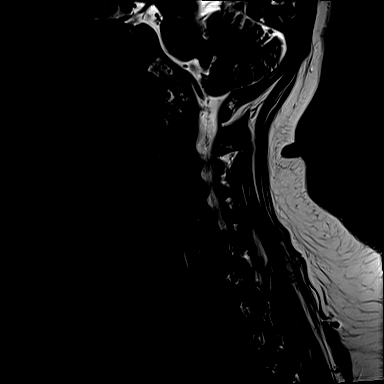
[im 8/15]
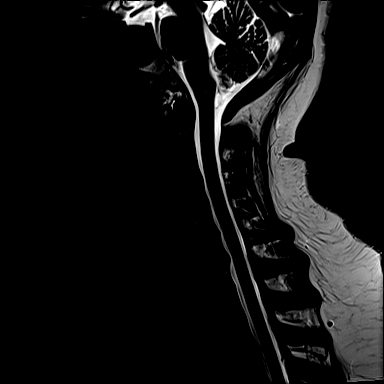
[im 10/15]
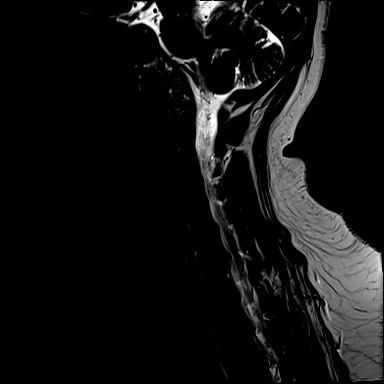
[im 12/15]
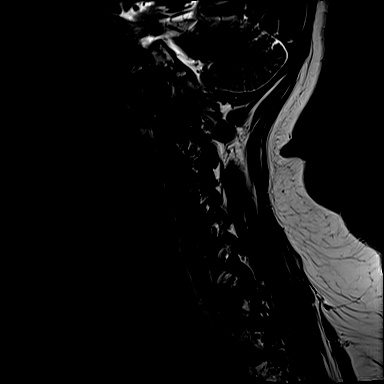
[im 15/15]
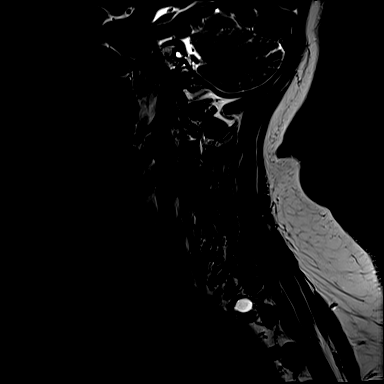

[Series 8: STIR · sagittal · 3.0mm · 0.33mm/px · 6 of 15 slices shown]
[im 1/15]
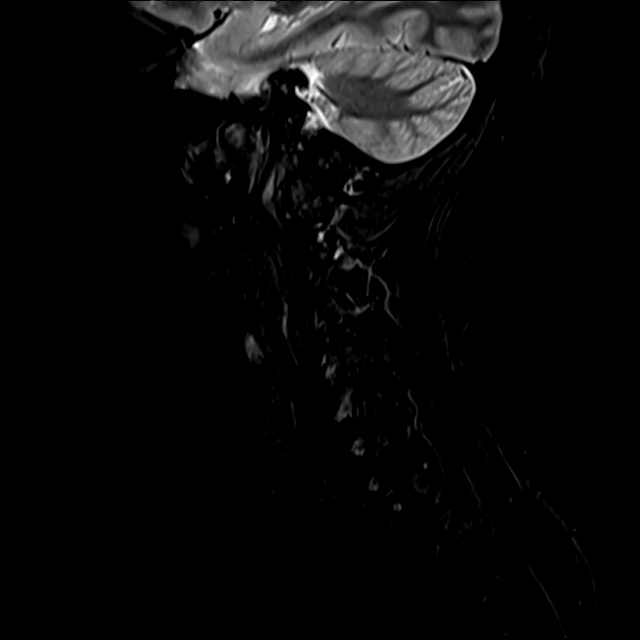
[im 3/15]
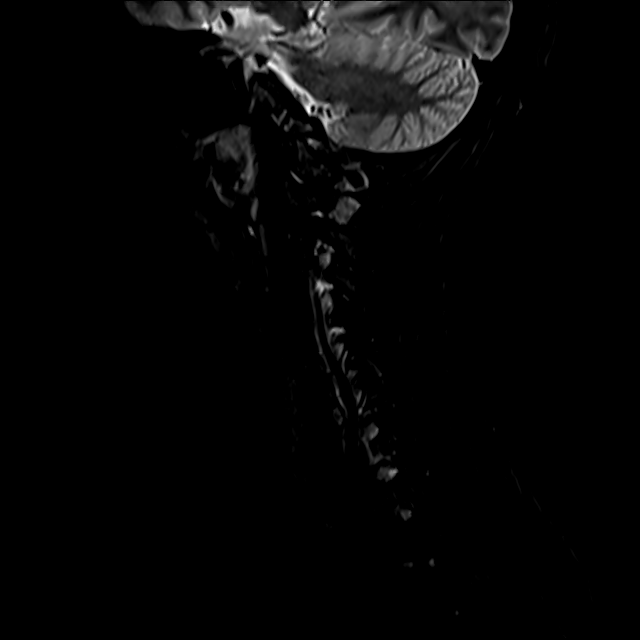
[im 5/15]
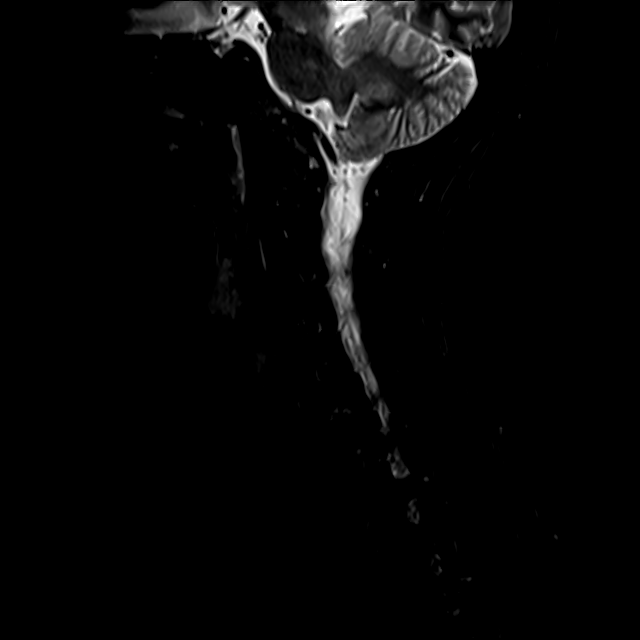
[im 8/15]
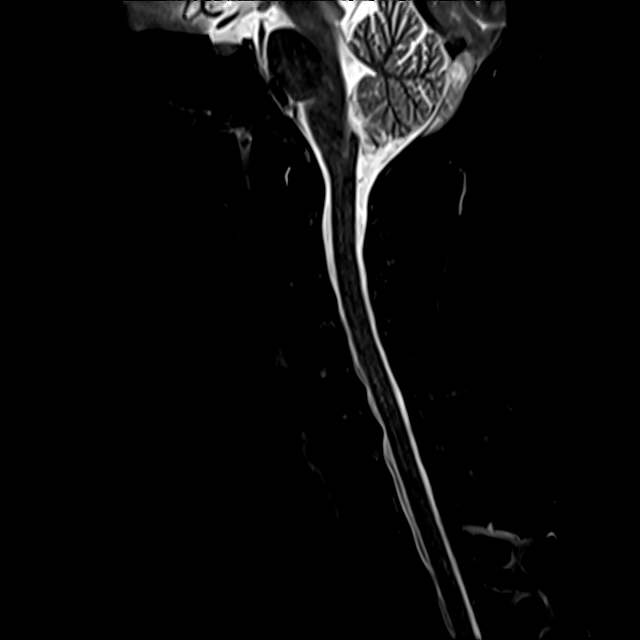
[im 10/15]
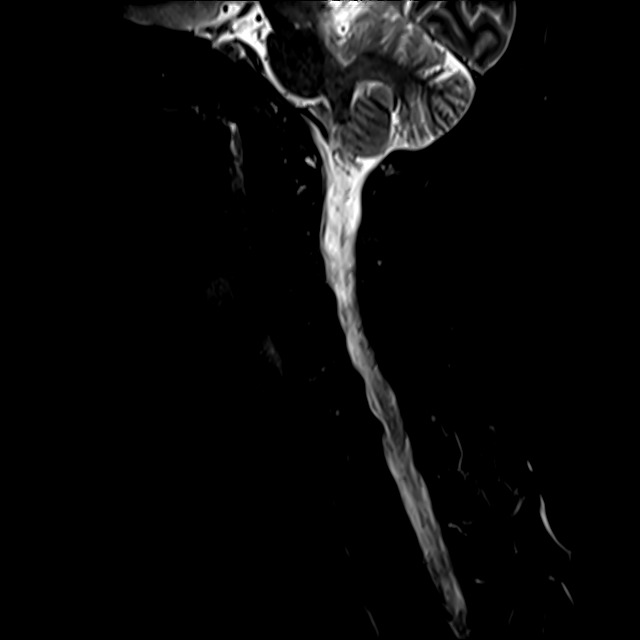
[im 12/15]
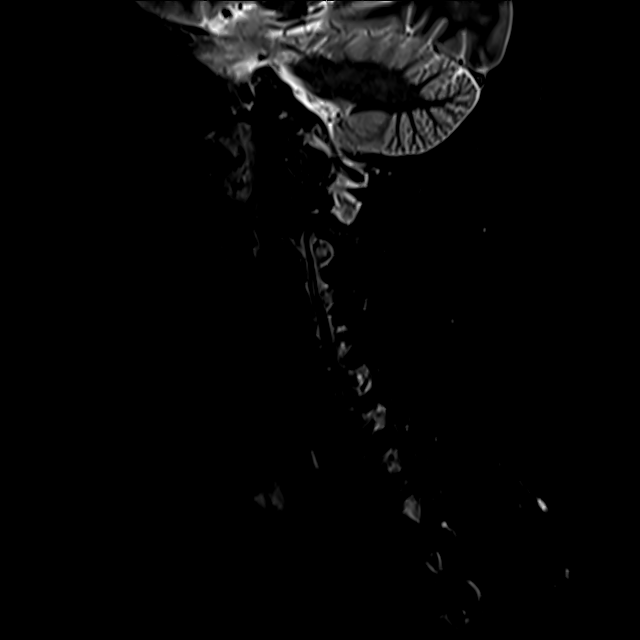

[Series 9: T2 · axial · 3.0mm · 0.50mm/px · z∈[-89,+3]mm · 8 of 30 slices shown (2 of 2)]
[im 1/30]
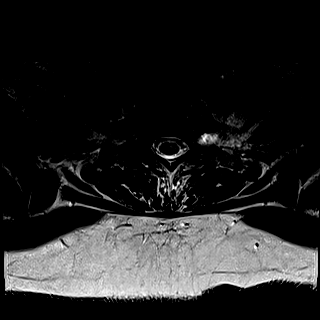
[im 5/30]
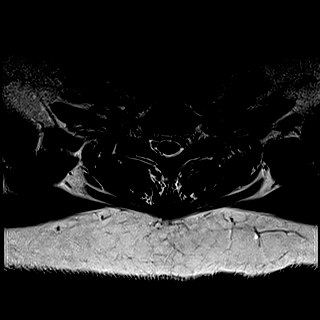
[im 9/30]
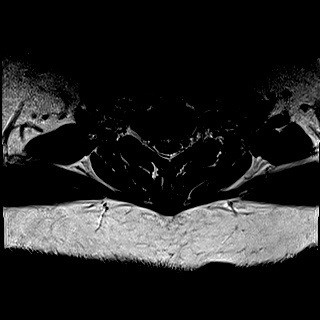
[im 14/30]
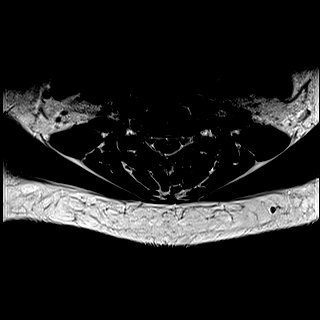
[im 16/30]
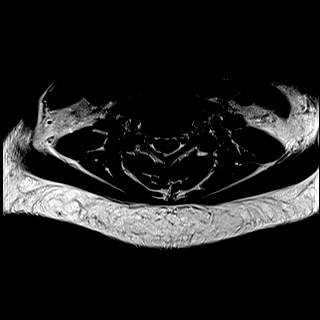
[im 21/30]
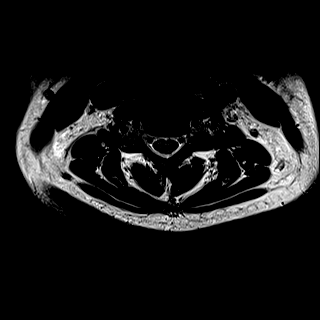
[im 25/30]
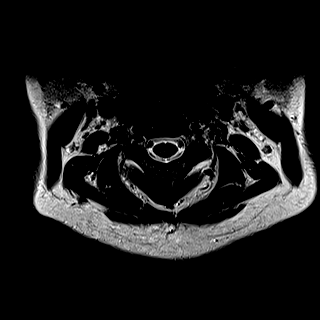
[im 30/30]
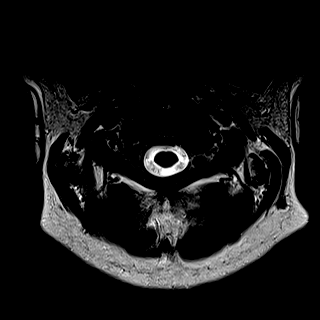

[27 of 48 positions shown; findings below may reference images not displayed]

FINDINGS: Alignment: Physiologic.

Vertebrae: No fracture, evidence of discitis, or bone lesion.

Cord: Normal signal and morphology.

Posterior Fossa, vertebral arteries, paraspinal tissues: Visualized
posterior fossa is normal. Vertebral artery flow voids are
preserved. No prevertebral soft tissue swelling.

Disc levels:

C1-C2: Normal.

C2-C3: Normal disc space and facets. No spinal canal or
neuroforaminal stenosis.

C3-C4: Normal disc space and facets. No spinal canal or
neuroforaminal stenosis.

C4-C5: Normal disc space and facets. No spinal canal or
neuroforaminal stenosis.

C5-C6: Mild disc bulge without stenosis.

C6-C7: Right uncovertebral disc osteophyte complex with moderate
right foraminal stenosis.

C7-T1: Normal disc space and facets. No spinal canal or
neuroforaminal stenosis.

T1-T3: These levels are imaged only in the sagittal plane. There is
a large perineural cyst at the left T1-T2 neural foramen. Small disc
bulge at T2-T3 without stenosis.
IMPRESSION: Moderate right C7 foraminal stenosis due to uncovertebral disc
osteophyte complex. Otherwise unremarkable MRI of the cervical
spine.

## 2019-10-27 ENCOUNTER — Other Ambulatory Visit: Payer: Self-pay | Admitting: Internal Medicine

## 2019-10-27 DIAGNOSIS — Z1231 Encounter for screening mammogram for malignant neoplasm of breast: Secondary | ICD-10-CM

## 2019-10-30 ENCOUNTER — Other Ambulatory Visit: Payer: Self-pay | Admitting: Podiatry

## 2019-10-30 ENCOUNTER — Ambulatory Visit: Payer: BC Managed Care – PPO | Admitting: Podiatry

## 2019-10-30 ENCOUNTER — Ambulatory Visit (INDEPENDENT_AMBULATORY_CARE_PROVIDER_SITE_OTHER): Payer: BC Managed Care – PPO

## 2019-10-30 ENCOUNTER — Other Ambulatory Visit: Payer: Self-pay

## 2019-10-30 ENCOUNTER — Encounter: Payer: Self-pay | Admitting: Podiatry

## 2019-10-30 VITALS — Temp 98.4°F

## 2019-10-30 DIAGNOSIS — M79671 Pain in right foot: Secondary | ICD-10-CM

## 2019-10-30 DIAGNOSIS — M722 Plantar fascial fibromatosis: Secondary | ICD-10-CM

## 2019-10-30 DIAGNOSIS — M779 Enthesopathy, unspecified: Secondary | ICD-10-CM | POA: Diagnosis not present

## 2019-10-31 NOTE — Progress Notes (Signed)
Subjective:   Patient ID: Natasha Lyons, female   DOB: 59 y.o.   MRN: [id number]   HPI Patient states that she has had quite a bit of pain in her right ankle and side of the foot and then the left heel has become sore and into the arch.  Patient states this started recently and will use medication several years ago it improved her quite a bit   ROS      Objective:  Physical Exam  Neuro vascular status intact with patient found to have inflammation of the sinus tarsi right extending slightly distal and has discomfort mostly in the left arch with inflammation fluid buildup     Assessment:  Sinus tarsitis right with inflammation along with mid arch fasciitis left     Plan:  H&P reviewed conditions.  For the right I went ahead did sterile prep injected the sinus tarsi 3 mg Kenalog 5 mg Xylocaine with sterile dressing and for the left I did sterile prep injected the arch 3 mg Kenalog 5 mg Xylocaine advised on good support shoes and reappoint to recheck  X-ray indicated moderate depression of the arch bilateral no indication of advanced arthritis or other pathology

## 2019-11-03 ENCOUNTER — Ambulatory Visit: Payer: BC Managed Care – PPO

## 2019-11-03 ENCOUNTER — Other Ambulatory Visit: Payer: Self-pay

## 2019-11-03 ENCOUNTER — Ambulatory Visit
Admission: RE | Admit: 2019-11-03 | Discharge: 2019-11-03 | Disposition: A | Discharge status: Home / Self Care | Payer: BC Managed Care – PPO | Source: Ambulatory Visit | Attending: Internal Medicine | Admitting: Internal Medicine

## 2019-11-03 DIAGNOSIS — Z1231 Encounter for screening mammogram for malignant neoplasm of breast: Secondary | ICD-10-CM

## 2019-11-19 ENCOUNTER — Encounter (INDEPENDENT_AMBULATORY_CARE_PROVIDER_SITE_OTHER): Payer: Self-pay

## 2019-11-24 ENCOUNTER — Ambulatory Visit: Payer: BC Managed Care – PPO | Admitting: Podiatry

## 2019-12-01 ENCOUNTER — Encounter (INDEPENDENT_AMBULATORY_CARE_PROVIDER_SITE_OTHER): Payer: Self-pay | Admitting: Family Medicine

## 2019-12-01 ENCOUNTER — Other Ambulatory Visit: Payer: Self-pay

## 2019-12-01 ENCOUNTER — Ambulatory Visit (INDEPENDENT_AMBULATORY_CARE_PROVIDER_SITE_OTHER): Payer: BC Managed Care – PPO | Admitting: Family Medicine

## 2019-12-01 VITALS — BP 130/81 | HR 69 | Temp 97.8°F | Ht 64.0 in | Wt 240.0 lb

## 2019-12-01 DIAGNOSIS — R0602 Shortness of breath: Secondary | ICD-10-CM | POA: Diagnosis not present

## 2019-12-01 DIAGNOSIS — F329 Major depressive disorder, single episode, unspecified: Secondary | ICD-10-CM

## 2019-12-01 DIAGNOSIS — R7303 Prediabetes: Secondary | ICD-10-CM

## 2019-12-01 DIAGNOSIS — F32A Depression, unspecified: Secondary | ICD-10-CM

## 2019-12-01 DIAGNOSIS — R5383 Other fatigue: Secondary | ICD-10-CM

## 2019-12-01 DIAGNOSIS — Z0289 Encounter for other administrative examinations: Secondary | ICD-10-CM

## 2019-12-01 DIAGNOSIS — E7849 Other hyperlipidemia: Secondary | ICD-10-CM | POA: Diagnosis not present

## 2019-12-01 DIAGNOSIS — Z9189 Other specified personal risk factors, not elsewhere classified: Secondary | ICD-10-CM

## 2019-12-01 DIAGNOSIS — Z6841 Body Mass Index (BMI) 40.0 and over, adult: Secondary | ICD-10-CM

## 2019-12-01 DIAGNOSIS — I10 Essential (primary) hypertension: Secondary | ICD-10-CM

## 2019-12-01 DIAGNOSIS — E538 Deficiency of other specified B group vitamins: Secondary | ICD-10-CM

## 2019-12-01 DIAGNOSIS — F419 Anxiety disorder, unspecified: Secondary | ICD-10-CM | POA: Diagnosis not present

## 2019-12-01 DIAGNOSIS — E559 Vitamin D deficiency, unspecified: Secondary | ICD-10-CM

## 2019-12-01 NOTE — Progress Notes (Signed)
Office: [phone]  /  Fax: [phone]    Date: December 11, 2019   Appointment Start Time: 2:58pm Duration: 52 minutes Provider: Glennie Isle, Psy.D. Type of Session: Intake for Individual Therapy  Location of Patient: Parked in car Location of Provider: Provider's Home Type of Contact: Telepsychological Visit via MyChart Video Visit  Informed Consent: This provider called Johnika at 2:55pm to assist with connection as MyChart Video Visit indicated she joined, but there was no audio or video connection. Prior to proceeding with today's appointment, two pieces of identifying information were obtained. In addition, Kierra's physical location at the time of this appointment was obtained as well a phone number she could be reached at in the event of technical difficulties. Layli and this provider participated in today's telepsychological service.   The provider's role was explained to [employer]. The provider reviewed and discussed issues of confidentiality, privacy, and limits therein (e.g., reporting obligations). In addition to verbal informed consent, written informed consent for psychological services was obtained prior to the initial appointment. Since the clinic is not a 24/7 crisis center, mental health emergency resources were shared and this  provider explained MyChart, e-mail, voicemail, and/or other messaging systems should be utilized only for non-emergency reasons. This provider also explained that information obtained during appointments will be placed in Teriana's medical record and relevant information will be shared with other providers at [facility] for coordination of care. Moreover, Pierina agreed information may be shared with other [facility] providers as needed for coordination of care. By signing the service agreement document, Jatavia provided written consent for coordination of care. Prior to initiating telepsychological services, Tymika  completed an informed consent document, which included the development of a safety plan (i.e., an emergency contact, nearest emergency room, and emergency resources) in the event of an emergency/crisis. Fantasy expressed understanding of the rationale of the safety plan. Chiniqua verbally acknowledged understanding she is ultimately responsible for understanding her insurance benefits for telepsychological and in-person services. This provider also reviewed confidentiality, as it relates to telepsychological services, as well as the rationale for telepsychological services (i.e., to reduce exposure risk to COVID-19). Alanta  acknowledged understanding that appointments cannot be recorded without both party consent and she is aware she is responsible for securing confidentiality on her end of the session. Naevia verbally consented to proceed.  Chief Complaint/HPI: Maliya was referred by Dr. Coralie Common due to anxiety and depression. Per the note for the initial visit with Dr. Coralie Common on Dec 01, 2019, "Davetta's mother passed away in 09/12/2019.  She is taking Wellbutrin 300 mg daily.  She has a significant history of emotional eating, almost daily." The note for the initial appointment with Dr. Coralie Common indicated the following: "Odaly's habits were reviewed today and are as follows: Her family eats meals together, she thinks her family will eat healthier with her, she struggles with family and or coworkers weight loss sabotage, her desired weight loss is 68 pounds, she has been heavy most of her life, she started gaining weight at 59 years old, her heaviest weight ever was 257 pounds, she craves salty and sweet foods such as chips, chocolate, and ice cream, she skips breakfast frequently, she is frequently drinking liquids with calories, she frequently makes poor food choices, she sometimes eats larger portions than normal and she struggles with emotional eating." [place]'s Food and Mood  (modified PHQ-9) score on Dec 01, 2019 was 13.  During today's appointment, Zanylah was  verbally administered a questionnaire assessing various behaviors related to emotional eating. Felix endorsed the following: overeat when you are celebrating, eat certain foods when you are anxious, stressed, depressed, or your feelings are hurt, use food to help you cope with emotional situations, find food is comforting to you, overeat when you are worried about something, overeat frequently when you are bored or lonely, not worry about what you eat when you are in a good mood, overeat when you are alone, but eat much less when you are with other people, eat to help you stay awake and eat as a reward. Kaely believes the onset of emotional eating was likely during puberty and described the frequency of emotional eating as "every day" after work prior to starting with the clinic. In addition, Chealsea denied a history of binge eating. Jaylenn denied a history of restricting food intake, purging and engagement in other compensatory strategies, and has never been diagnosed with an eating disorder. She also denied a history of treatment for emotional eating.  Moreover, Chayah indicated exhaustion triggers emotional eating, whereas focusing on food intake for the day makes emotional eating better. Furthermore, Sera shared her mother recently passed away. She further shared she continues to take care of her father. Reigna also discussed experiencing symptoms of anxiety.  Mental Status Examination:  Appearance: well groomed and appropriate hygiene  Behavior: appropriate to circumstances Mood: euthymic Affect: mood congruent Speech: normal in rate, volume, and tone Eye Contact: appropriate Psychomotor Activity: appropriate Gait: unable to assess Thought Process: linear, logical, and goal directed  Thought Content/Perception: denies suicidal and homicidal ideation, plan, and intent and no hallucinations, delusions, bizarre  thinking or behavior reported or observed Orientation: time, person, place and purpose of appointment Memory/Concentration: memory, attention, language, and fund of knowledge intact  Insight/Judgment: good  Family & Psychosocial History: Mckynlie reported she is married and she has one adopted daughter (age 56). 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 Additionally, Quenisha shared her highest level of education obtained is a master's degree. Currently, Schuyler's social support system consists of her husband, daughter, and friends. Moreover, Verble stated she resides with her husband and father.   Medical History:  Past Medical History:  Diagnosis Date  . Allergy    takes Zyrtec daily  . Anemia   . Anxiety   . Arthritis    psorriatric arthritis  . Asthma    Albuterol as needed as well as neb as needed  . Back pain   . Bilateral swelling of feet   . Chronic back pain    pinched nerve  . Constipation   . Cough   . Depression   . Family history of adverse reaction to anesthesia    pts dad gets sick with anesthesia  . Fibromyalgia    takes Effexor daily  . Fibromyalgia   . Gallbladder problem   . GERD (gastroesophageal reflux disease)   . Headache    seasonal   . History of bronchitis 2016  . History of shingles   . Hypertension    takes Amlodipine daily  . Hypothyroid   . Infertility, female   . Joint pain   . Joint pain   . Joint swelling   . Muscle spasm    takes Robaxin as needed  . Osteoarthritis   . Osteoarthritis   . Palpitations   . Pneumonia    hx of-10+ yrs ago  . Prediabetes   . Psoriasis   . Psoriatic arthritis ([place])   .  Shortness of breath   . Vitamin D deficiency    Past Surgical History:  Procedure Laterality Date  . APPENDECTOMY    . BIOPSY THYROID  2014   results benign  . CHOLECYSTECTOMY  2008  . DIAGNOSTIC LAPAROSCOPY     d/c endometriosis  . DILATION AND CURETTAGE OF UTERUS    . INSERTION OF MESH N/A 06/15/2016     Procedure: INSERTION OF MESH;  Surgeon: Autumn Messing III, MD;  Location: [facility];  Service: General;  Laterality: N/A;  . KNEE ARTHROSCOPY Bilateral 2011  . MASS EXCISION Right 04/21/2014   Procedure: EXCISION OF RIGHT BACK MASS;  Surgeon: Coralie Keens, MD;  Location: [facility];  Service: General;  Laterality: Right;  . TONSILLECTOMY    . VENTRAL HERNIA REPAIR  06/15/2016  . VENTRAL HERNIA REPAIR N/A 06/15/2016   Procedure: LAPAROSCOPIC VENTRAL HERNIA WITH MESH;  Surgeon: Autumn Messing III, MD;  Location: [facility];  Service: General;  Laterality: N/A;   Current Outpatient Medications on File Prior to Visit  Medication Sig Dispense Refill  . Acetaminophen (TYLENOL 8 HOUR PO) Take by mouth.    Marland Kitchen albuterol (PROVENTIL HFA) 108 (90 BASE) MCG/ACT inhaler Inhale 2 puffs into the lungs every 6 (six) hours as needed for wheezing.    Marland Kitchen albuterol (PROVENTIL) (2.5 MG/3ML) 0.083% nebulizer solution Take 2.5 mg by nebulization every 6 (six) hours as needed for wheezing or shortness of breath.    Marland Kitchen amLODipine (NORVASC) 5 MG tablet Take 5 mg by mouth daily.    Francia Greaves THYROID 15 MG tablet Take 15 mg by mouth daily.    Marland Kitchen BAYER CONTOUR NEXT TEST test strip USE AS INSTRUCTED TO CHECK BLOOD SUGARS ONCE A DAY (Patient not taking: Reported on 12/01/2019) 50 each 1  . BIOTIN PO Take by mouth.    Marland Kitchen buPROPion (WELLBUTRIN XL) 300 MG 24 hr tablet Take 300 mg by mouth at bedtime.    . cetirizine (ZYRTEC) 10 MG tablet Take 10 mg by mouth daily.    . clobetasol ointment (TEMOVATE) 3.33 % Apply 1 application topically 2 (two) times daily.    . Cyanocobalamin (VITAMIN B-12 CR PO) Take by mouth.    . DYMISTA 137-50 MCG/ACT SUSP USE 2 SPRAYS INTO EACH NOSTRIL 1-2 TIMES A DAY 1 Bottle 0  . fluticasone (FLONASE) 50 MCG/ACT nasal spray     . folic acid (FOLVITE) 545 MCG tablet Take 800 mcg by mouth daily.     . Menthol, Topical Analgesic, (BIOFREEZE ROLL-ON COLORLESS) 4 % GEL Apply 1-2 application topically 4 (four)  times daily as needed (for pain.).    Marland Kitchen methotrexate (RHEUMATREX) 2.5 MG tablet Take 20 mg by mouth every Sunday. Caution:Chemotherapy. Protect from light.     . montelukast (SINGULAIR) 10 MG tablet Take 10 mg by mouth at bedtime.    . naproxen (NAPROSYN) 500 MG tablet Take 1 tablet (500 mg total) by mouth 2 (two) times daily. 30 tablet 0  . Secukinumab (COSENTYX 300 DOSE) 150 MG/ML SOSY Inject 300 mg into the skin every 30 (thirty) days.    Marland Kitchen venlafaxine XR (EFFEXOR-XR) 75 MG 24 hr capsule TAKE 1 CAPSULE (75 MG TOTAL) BY MOUTH DAILY (Patient not taking: Reported on 12/01/2019) 90 capsule 1  . Vitamin D, Ergocalciferol, (DRISDOL) 50000 UNITS CAPS capsule Take 1 capsule (50,000 Units total) by mouth every 7 (seven) days. (Patient taking differently: Take 50,000 Units by mouth every Sunday. ) 8 capsule 0  . WIXELA INHUB  250-50 MCG/DOSE AEPB Inhale 1 puff into the lungs 2 (two) times daily.     No current facility-administered medications on file prior to visit.  Yesika denied a history of head injuries and loss of consciousness.   Mental Health History: Claira reported she recently initiated therapeutic services with Cristy Hilts with [facility] to address grief and current stressors. She agreed to inform Ms. Chrissie Noa about meeting with this provider and signing an authorization for coordination of care if needed. Dela stated she has not scheduled a follow-up appointment with Ms. Watkins at this time due to work. Mariem reported there is no history of hospitalizations for psychiatric concerns, and she has never met with a psychiatrist. Christianna stated her PCP currently prescribes Wellbutrin. Kamilah endorsed a family history of mental health related concerns. She stated her father and paternal aunt are diagnosed with Alzheimer's. Dejuana reported there is no history of trauma including psychological, physical  and sexual abuse, as well as neglect.   Sereena described her typical mood lately  as "a glimmer of light" in the past two weeks. Aside from concerns noted above and endorsed on the PHQ-9, Carena reported experiencing crying spells due to grief and worry thoughts about father's well-being and husband's well-being. She also described experiencing what if scenarios about different things. Mckaylin reported consuming a standard drink of alcohol on weekends or in social situations. She denied tobacco use. She endorsed smoking "CBD" 2-3 times a week in the form of "couple of puffs," adding, "It is the legal kind." Killian noted she smokes at home and does not operate cars, machinery, etc after use. She believes her PCP is aware of her CBD use. Regarding caffeine intake, Breslyn reported consuming one cup of coffee every couple days. Furthermore, Penina indicated she is not experiencing the following: hallucinations and delusions, paranoia, symptoms of mania , social withdrawal, panic attacks and decreased motivation. She also denied history of and current suicidal ideation, plan, and intent; history of and current homicidal ideation, plan, and intent; and history of and current engagement in self-harm.  The following strengths were reported by Jamoni: focused, dedicated with work, Hydrologist, compassionate, and great friend. The following strengths were observed by this provider: ability to express thoughts and feelings during the therapeutic session, ability to establish and benefit from a therapeutic relationship, willingness to work toward established goal(s) with the clinic and ability to engage in reciprocal conversation.  Legal History: Xaniyah reported there is no history of legal involvement.   Structured Assessments Results: The Patient Health Questionnaire-9 (PHQ-9) is a self-report measure that assesses symptoms and severity of depression over the course of the last two weeks. Glyn obtained a score of 3 suggesting minimal depression. Ricka finds the endorsed symptoms to be somewhat  difficult. [0= Not at all; 1= Several days; 2= More than half the days; 3= Nearly every day] Little interest or pleasure in doing things 0  Feeling down, depressed, or hopeless 0  Trouble falling or staying asleep, or sleeping too much 0  Feeling tired or having little energy 3  Poor appetite or overeating 0  Feeling bad about yourself --- or that you are a failure or have let yourself or your family down 0  Trouble concentrating on things, such as reading the newspaper or watching television 0  Moving or speaking so slowly that other people could have noticed? Or the opposite --- being so fidgety or restless that you have been moving around a lot more than usual 0  Thoughts that you would be better off dead or hurting yourself in some way 0  PHQ-9 Score 3    The Generalized Anxiety Disorder-7 (GAD-7) is a brief self-report measure that assesses symptoms of anxiety over the course of the last two weeks. Annaliese obtained a score of 0. [0= Not at all; 1= Several days; 2= Over half the days; 3= Nearly every day] Feeling nervous, anxious, on edge 0  Not being able to stop or control worrying 0  Worrying too much about different things 0  Trouble relaxing 0  Being so restless that it's hard to sit still 0  Becoming easily annoyed or irritable 0  Feeling afraid as if something awful might happen 0  GAD-7 Score 0   Interventions:  Conducted a chart review Focused on rapport building Verbally administered PHQ-9 and GAD-7 for symptom monitoring Verbally administered Food & Mood questionnaire to assess various behaviors related to emotional eating Provided emphatic reflections and validation Collaborated with patient on a treatment goal  Psychoeducation provided regarding physical versus emotional hunger  Provisional DSM-5 Diagnosis(es): 300.09 (F41.8) Other Specified Anxiety Disorder, Emotional Eating Behaviors  Plan: Chene appears able and willing to participate as evidenced by  collaboration on a treatment goal, engagement in reciprocal conversation, and asking questions as needed for clarification. The next appointment will be scheduled in three weeks, which will be via MyChart Video Visit. The following treatment goal was established: increase coping skills. This provider will regularly review the treatment plan and medical chart to keep informed of status changes. Analisia expressed understanding and agreement with the initial treatment plan of care. Jozalynn will be sent a handout via e-mail to utilize between now and the next appointment to increase awareness of hunger patterns and subsequent eating. Saranda provided verbal consent during today's appointment for this provider to send the handout via e-mail.

## 2019-12-01 NOTE — Telephone Encounter (Signed)
Please advise 

## 2019-12-02 LAB — T3: T3, Total: 104 ng/dL (ref 71–180)

## 2019-12-02 LAB — HEMOGLOBIN A1C
Est. average glucose Bld gHb Est-mCnc: 131 mg/dL
Hgb A1c MFr Bld: 6.2 % — ABNORMAL HIGH (ref 4.8–5.6)

## 2019-12-02 LAB — T4: T4, Total: 7 ug/dL (ref 4.5–12.0)

## 2019-12-02 LAB — INSULIN, RANDOM: INSULIN: 13.9 u[IU]/mL (ref 2.6–24.9)

## 2019-12-02 LAB — VITAMIN B12: Vitamin B-12: 1410 pg/mL — ABNORMAL HIGH (ref 232–1245)

## 2019-12-02 LAB — FOLATE: Folate: 13.3 ng/mL (ref >3.0)

## 2019-12-02 NOTE — Progress Notes (Signed)
Chief Complaint:   OBESITY Natasha Lyons (MR# [id number]) is a 59 y.o. female who presents for evaluation and treatment of obesity and related comorbidities. Current BMI is Body mass index is 41.2 kg/m. Natasha Lyons has been struggling with her weight for many years and has been unsuccessful in either losing weight, maintaining weight loss, or reaching her healthy weight goal.  Natasha Lyons is currently in the action stage of change and ready to dedicate time achieving and maintaining a healthier weight. Natasha Lyons is interested in becoming our patient and working on intensive lifestyle modifications including (but not limited to) diet and exercise for weight loss.  Natasha Lyons heard about the clinic from 2 of her coworkers.  She says that she skips breakfast but has coffee.  She does not eat oatmeal.  Around 11 am, she occasional will eat lunch that consists of a frozen meal (started doing IF).  She says she is starving when she gets home.  Dinner will be around 6 ounces of meat, baked potato or sweet potato or 3/4 cup rice, vegetables (sauteed).  She will have a snack of chips and water before dinner.  She was previously on Qsymia.  Natasha Lyons habits were reviewed today and are as follows: Her family eats meals together, she thinks her family will eat healthier with her, she struggles with family and or coworkers weight loss sabotage, her desired weight loss is 68 pounds, she has been heavy most of her life, she started gaining weight at 59 years old, her heaviest weight ever was 257 pounds, she craves salty and sweet foods such as chips, chocolate, and ice cream, she skips breakfast frequently, she is frequently drinking liquids with calories, she frequently makes poor food choices, she sometimes eats larger portions than normal and she struggles with emotional eating.  Depression Screen Natasha Lyons's Food and Mood (modified PHQ-9) score was 13.  Depression screen [facility] 2/9 12/01/2019  Decreased Interest 2    Down, Depressed, Hopeless 1  PHQ - 2 Score 3  Altered sleeping 1  Tired, decreased energy 3  Change in appetite 2  Feeling bad or failure about yourself  2  Trouble concentrating 2  Moving slowly or fidgety/restless 0  Suicidal thoughts 0  PHQ-9 Score 13  Difficult doing work/chores Not difficult at all   Subjective:   1. Other fatigue Natasha Lyons admits to daytime somnolence and reports waking up still tired. Patent has a history of symptoms of daytime fatigue, morning fatigue, morning headache and snoring. September generally gets 4 or 5 hours of sleep per night, and states that she has poor quality sleep. Snoring is present. Apneic episodes are not present. Epworth Sleepiness Score is 11.  2. SOB (shortness of breath) on exertion Natasha Lyons notes increasing shortness of breath with exercising and seems to be worsening over time with weight gain. She notes getting out of breath sooner with activity than she used to. This has gotten worse recently. Natasha Lyons denies shortness of breath at rest or orthopnea.  3. Vitamin D deficiency Natasha Lyons Vitamin D level was 30.1.  She is currently taking prescription vitamin D 50,000 IU each week. She denies nausea, vomiting or muscle weakness.  She endorses fatigue.  4. Other hyperlipidemia Natasha Lyons has hyperlipidemia and has been trying to improve her cholesterol levels with intensive lifestyle modification including a low saturated fat diet, exercise and weight loss. She denies any chest pain, claudication or myalgias.  Lab Results  Component Value Date   ALT 18 01/22/2017   AST  15 01/22/2017   ALKPHOS 70 01/22/2017   BILITOT 0.3 01/22/2017   Lab Results  Component Value Date   CHOL 144 01/22/2017   HDL 52.90 01/22/2017   LDLCALC 81 01/22/2017   TRIG 50.0 01/22/2017   CHOLHDL 3 01/22/2017   5. Essential hypertension Blood pressure is within normal limits today.  She was diagnosed 15 years ago.  She takes amlodipine 5 mg daily.  BP Readings from  Last 3 Encounters:  12/01/19 130/81  08/08/17 127/78  05/02/17 118/82   6. Vitamin B12 deficiency She notes fatigue. She is not a vegetarian.  She does not have a previous diagnosis of pernicious anemia.  She does not have a history of weight loss surgery.  She is taking vitamin B12 daily.   7. Prediabetes Natasha Lyons has a diagnosis of prediabetes based on her elevated HgA1c and was informed this puts her at greater risk of developing diabetes. She continues to work on diet and exercise to decrease her risk of diabetes. She denies nausea or hypoglycemia.  Unsure how long ago she received her diagnosis.  8. Anxiety and depression Natasha Lyons mother passed away in 09/19/19.  She is taking Wellbutrin 300 mg daily.  She has a significant history of emotional eating, almost daily.  9. At risk for diabetes mellitus Natasha Lyons is at higher than average risk for developing diabetes due to her obesity.   Assessment/Plan:   1. Other fatigue Natasha Lyons does feel that her weight is causing her energy to be lower than it should be. Fatigue may be related to obesity, depression or many other causes. Labs will be ordered, and in the meanwhile, Natasha Lyons will focus on self care including making healthy food choices, increasing physical activity and focusing on stress reduction. - EKG 12-Lead - Vitamin B12 - Folate - T3 - T4  2. SOB (shortness of breath) on exertion Natasha Lyons does feel that she gets out of breath more easily that she used to when she exercises. Natasha Lyons's shortness of breath appears to be obesity related and exercise induced. She has agreed to work on weight loss and gradually increase exercise to treat her exercise induced shortness of breath. Will continue to monitor closely. - Vitamin B12 - Folate - T3 - T4  3. Vitamin D deficiency Low Vitamin D level contributes to fatigue and are associated with obesity, breast, and colon cancer. She agrees to continue to take prescription Vitamin D @50 ,000 IU  every week and will follow-up for routine testing of Vitamin D, at least 2-3 times per year to avoid over-replacement.  Will check vitamin D level in 3 months.  4. Other hyperlipidemia Cardiovascular risk and specific lipid/LDL goals reviewed.  We discussed several lifestyle modifications today and Natasha Lyons will continue to work on diet, exercise and weight loss efforts. Orders and follow up as documented in patient record.   Counseling Intensive lifestyle modifications are the first line treatment for this issue. . Dietary changes: Increase soluble fiber. Decrease simple carbohydrates. . Exercise changes: Moderate to vigorous-intensity aerobic activity 150 minutes per week if tolerated. . Lipid-lowering medications: see documented in medical record.  5. Essential hypertension Natasha Lyons is working on healthy weight loss and exercise to improve blood pressure control. We will watch for signs of hypotension as she continues her lifestyle modifications.  6. Vitamin B12 deficiency Natasha Lyons is working on healthy weight loss and exercise to improve blood pressure control. We will watch for signs of hypotension as she continues her lifestyle modifications.  7. Prediabetes Natasha Lyons will  continue to work on weight loss, exercise, and decreasing simple carbohydrates to help decrease the risk of diabetes.  - Hemoglobin A1c - Insulin, random  8. Anxiety and depression Patient was referred to Dr. Mallie Mussel, our Bariatric Psychologist, for evaluation due to her elevated PHQ-9 score and significant struggles with emotional eating.  9. At risk for diabetes mellitus Natasha Lyons was given approximately 15 minutes of diabetes education and counseling today. We discussed intensive lifestyle modifications today with an emphasis on weight loss as well as increasing exercise and decreasing simple carbohydrates in her diet. We also reviewed medication options with an emphasis on risk versus benefit of those discussed.    Repetitive spaced learning was employed today to elicit superior memory formation and behavioral change.  10. Class 3 severe obesity with serious comorbidity and body mass index (BMI) of 40.0 to 44.9 in adult, unspecified obesity type (HCC) Natasha Lyons is currently in the action stage of change and her goal is to continue with weight loss efforts. I recommend Natasha Lyons begin the structured treatment plan as follows:  She has agreed to the Category 3 Plan.  Exercise goals: No exercise has been prescribed at this time.   Behavioral modification strategies: increasing lean protein intake, increasing vegetables, meal planning and cooking strategies, keeping healthy foods in the home and planning for success.  She was informed of the importance of frequent follow-up visits to maximize her success with intensive lifestyle modifications for her multiple health conditions. She was informed we would discuss her lab results at her next visit unless there is a critical issue that needs to be addressed sooner. Natasha Lyons agreed to keep her next visit at the agreed upon time to discuss these results.  Objective:   Blood pressure 130/81, pulse 69, temperature 97.8 F (36.6 C), temperature source Oral, height 5\' 4"  (1.626 m), weight 240 lb (108.9 kg), SpO2 93 %. Body mass index is 41.2 kg/m.  EKG: Normal sinus rhythm, rate 71 bpm.  Indirect Calorimeter completed today shows a VO2 of 284 and a REE of 1974.  Her calculated basal metabolic rate is [id number] thus her basal metabolic rate is better than expected.  General: Cooperative, alert, well developed, in no acute distress. HEENT: Conjunctivae and lids unremarkable. Cardiovascular: Regular rhythm.  Lungs: Normal work of breathing. Neurologic: No focal deficits.   Lab Results  Component Value Date   CREATININE 0.56 08/08/2017   BUN 9 08/08/2017   NA 139 08/08/2017   K 3.9 08/08/2017   CL 105 08/08/2017   CO2 23 08/08/2017   Lab Results  Component Value  Date   ALT 18 01/22/2017   AST 15 01/22/2017   ALKPHOS 70 01/22/2017   BILITOT 0.3 01/22/2017   Lab Results  Component Value Date   HGBA1C 6.2 (H) 12/01/2019   HGBA1C 6.4 04/20/2017   HGBA1C 6.5 01/22/2017   HGBA1C 5.8 09/04/2014   HGBA1C 5.2 11/04/2013   Lab Results  Component Value Date   INSULIN WILL FOLLOW 12/01/2019   Lab Results  Component Value Date   TSH 1.15 01/22/2017   Lab Results  Component Value Date   CHOL 144 01/22/2017   HDL 52.90 01/22/2017   LDLCALC 81 01/22/2017   TRIG 50.0 01/22/2017   CHOLHDL 3 01/22/2017   Lab Results  Component Value Date   WBC 10.5 08/08/2017   HGB 14.0 08/08/2017   HCT 43.3 08/08/2017   MCV 82.5 08/08/2017   PLT 293 08/08/2017   Attestation Statements:   This is the patient's  first visit at [facility]. The patient's NEW PATIENT PACKET was reviewed at length. Included in the packet: current and past health history, medications, allergies, ROS, gynecologic history (women only), surgical history, family history, social history, weight history, weight loss surgery history (for those that have had weight loss surgery), nutritional evaluation, mood and food questionnaire, PHQ9, Epworth questionnaire, sleep habits questionnaire, patient life and health improvement goals questionnaire. These will all be scanned into the patient's chart under media.   During the visit, I independently reviewed the patient's EKG, bioimpedance scale results, and indirect calorimeter results. I used this information to tailor a meal plan for the patient that will help her to lose weight and will improve her obesity-related conditions going forward. I performed a medically necessary appropriate examination and/or evaluation. I discussed the assessment and treatment plan with the patient. The patient was provided an opportunity to ask questions and all were answered. The patient agreed with the plan and demonstrated an understanding of the  instructions. Labs were ordered at this visit and will be reviewed at the next visit unless more critical results need to be addressed immediately. Clinical information was updated and documented in the EMR.   Time spent on visit including pre-visit chart review and post-visit care was 45 minutes.   A separate 15 minutes was spent on risk counseling (see above).    I, Water quality scientist, CMA, am acting as transcriptionist for Coralie Common, MD.  I have reviewed the above documentation for accuracy and completeness, and I agree with the above. - Jinny Blossom, MD

## 2019-12-11 ENCOUNTER — Other Ambulatory Visit: Payer: Self-pay

## 2019-12-11 ENCOUNTER — Telehealth (INDEPENDENT_AMBULATORY_CARE_PROVIDER_SITE_OTHER): Payer: BC Managed Care – PPO | Admitting: Psychology

## 2019-12-11 DIAGNOSIS — F418 Other specified anxiety disorders: Secondary | ICD-10-CM | POA: Diagnosis not present

## 2019-12-15 ENCOUNTER — Ambulatory Visit (INDEPENDENT_AMBULATORY_CARE_PROVIDER_SITE_OTHER): Payer: BC Managed Care – PPO | Admitting: Family Medicine

## 2019-12-15 ENCOUNTER — Other Ambulatory Visit: Payer: Self-pay

## 2019-12-15 ENCOUNTER — Encounter (INDEPENDENT_AMBULATORY_CARE_PROVIDER_SITE_OTHER): Payer: Self-pay | Admitting: Family Medicine

## 2019-12-15 VITALS — BP 145/83 | HR 69 | Temp 97.5°F | Ht 64.0 in | Wt 240.0 lb

## 2019-12-15 DIAGNOSIS — I10 Essential (primary) hypertension: Secondary | ICD-10-CM | POA: Diagnosis not present

## 2019-12-15 DIAGNOSIS — E559 Vitamin D deficiency, unspecified: Secondary | ICD-10-CM

## 2019-12-15 DIAGNOSIS — E1165 Type 2 diabetes mellitus with hyperglycemia: Secondary | ICD-10-CM | POA: Diagnosis not present

## 2019-12-15 DIAGNOSIS — Z6841 Body Mass Index (BMI) 40.0 and over, adult: Secondary | ICD-10-CM

## 2019-12-15 DIAGNOSIS — E538 Deficiency of other specified B group vitamins: Secondary | ICD-10-CM

## 2019-12-15 DIAGNOSIS — Z9189 Other specified personal risk factors, not elsewhere classified: Secondary | ICD-10-CM | POA: Diagnosis not present

## 2019-12-15 MED ORDER — METFORMIN HCL 500 MG PO TABS: 500.0000 mg | ORAL_TABLET | Freq: Every day | ORAL | 0 refills | Status: DC

## 2019-12-15 NOTE — Progress Notes (Signed)
Chief Complaint:   OBESITY Natasha Lyons is here to discuss her progress with her obesity treatment plan along with follow-up of her obesity related diagnoses. Natasha Lyons is on the Category 3 Plan and states she is following her eating plan approximately 75% of the time. Natasha Lyons states she is exercising 0 minutes 0 times per week.  Today's visit was #: 2 Starting weight: 240 lbs Starting date: 12/01/2019 Today's weight: 240 lbs Today's date: 12/15/2019 Total lbs lost to date: 0 Total lbs lost since last in-office visit: 0  Interim History: Natasha Lyons voices she tried to stick to her plan 100% the first week and then the second week was harder to stick to secondary to how busy work was. She found she didn't eat as much the second week as she wasn't hungry. She wants to really try to make a stronger commitment to the meal plan.  Subjective:   Type 2 diabetes mellitus with hyperglycemia, without long-term current use of insulin (Natasha Lyons). Natasha Lyons is not on metformin.   Lab Results  Component Value Date   HGBA1C 6.2 (H) 12/01/2019   HGBA1C 6.4 04/20/2017   HGBA1C 6.5 01/22/2017   Lab Results  Component Value Date   LDLCALC 81 01/22/2017   CREATININE 0.56 08/08/2017   Lab Results  Component Value Date   INSULIN 13.9 12/01/2019   Vitamin D deficiency. Last Vitamin D level 30.1. No nausea, vomiting, or muscle weakness, but she endorses fatigue.  Essential hypertension. Blood pressure is slightly elevated today. No chest pain, chest pressure, or headache. Natasha Lyons is on amlodipine 5 mg daily.  BP Readings from Last 3 Encounters:  12/15/19 (!) 145/83  12/01/19 130/81  08/08/17 127/78   Lab Results  Component Value Date   CREATININE 0.56 08/08/2017   CREATININE 0.52 04/20/2017   CREATININE 0.55 01/22/2017   B12 deficiency. Natasha Lyons is on a B12 supplement. She endorses fatigue.   Lab Results  Component Value Date   VITAMINB12 1,410 (H) 12/01/2019   At risk for osteoporosis.  Natasha Lyons is at higher risk of osteopenia and osteoporosis due to Vitamin D deficiency.   Assessment/Plan:   Type 2 diabetes mellitus with hyperglycemia, without long-term current use of insulin (Natasha Lyons). Good blood sugar control is important to decrease the likelihood of diabetic complications such as nephropathy, neuropathy, limb loss, blindness, coronary artery disease, and death. Intensive lifestyle modification including diet, exercise and weight loss are the first line of treatment for diabetes. Natasha Lyons will start metFORMIN (GLUCOPHAGE) 500 MG tablet PO QAM #30 with 0 refills.  Vitamin D deficiency. Low Vitamin D level contributes to fatigue and are associated with obesity, breast, and colon cancer. She agrees to continue to take prescription Vitamin D @50 ,000 IU every week and will follow-up for routine testing of Vitamin D, at least 2-3 times per year to avoid over-replacement.  Essential hypertension. Natasha Lyons is working on healthy weight loss and exercise to improve blood pressure control. We will watch for signs of hypotension as she continues her lifestyle modifications. Will follow-up at her next appointment.  B12 deficiency. The diagnosis was reviewed with the patient. Counseling provided today, see below. We will continue to monitor. Orders and follow up as documented in patient record. Natasha Lyons will decrease her B12 supplement to every other day.  Counseling . The body needs vitamin B12: to make red blood cells; to make DNA; and to help the nerves work properly so they can carry messages from the brain to the body.  . The  main causes of vitamin B12 deficiency include dietary deficiency, digestive diseases, pernicious anemia, and having a surgery in which part of the stomach or small intestine is removed.  . Certain medicines can make it harder for the body to absorb vitamin B12. These medicines include: heartburn medications; some antibiotics; some medications used to treat diabetes, gout, and  high cholesterol.  . In some cases, there are no symptoms of this condition. If the condition leads to anemia or nerve damage, various symptoms can occur, such as weakness or fatigue, shortness of breath, and numbness or tingling in your hands and feet.   . Treatment:  o May include taking vitamin B12 supplements.  o Avoid alcohol.  o Eat lots of healthy foods that contain vitamin B12: - Beef, pork, chicken, [place], and organ meats, such as liver.  - Seafood: This includes clams, rainbow trout, salmon, tuna, and haddock.  - Eggs.  - Cereal and dairy products that are fortified: This means that vitamin B12 has been added to the food.   At risk for osteoporosis. Natasha Lyons was given approximately 30 minutes of osteoporosis prevention counseling today. Natasha Lyons is at risk for osteopenia and osteoporosis due to her Vitamin D deficiency. She was encouraged to take her Vitamin D and follow her higher calcium diet and increase strengthening exercise to help strengthen her bones and decrease her risk of osteopenia and osteoporosis.  Repetitive spaced learning was employed today to elicit superior memory formation and behavioral change.  Class 3 severe obesity with serious comorbidity and body mass index (BMI) of 40.0 to 44.9 in adult, unspecified obesity type (Natasha Lyons).  Natasha Lyons is currently in the action stage of change. As such, her goal is to continue with weight loss efforts. She has agreed to the Category 3 Plan.   Exercise goals: No exercise has been prescribed at this time.  Behavioral modification strategies: increasing lean protein intake, meal planning and cooking strategies and keeping healthy foods in the home.  Natasha Lyons has agreed to follow-up with our clinic in 2 weeks. She was informed of the importance of frequent follow-up visits to maximize her success with intensive lifestyle modifications for her multiple health conditions.   Objective:   Blood pressure (!) 145/83, pulse 69, temperature (!)  97.5 F (36.4 C), temperature source Oral, height 5\' 4"  (1.626 m), weight 240 lb (108.9 kg), SpO2 95 %. Body mass index is 41.2 kg/m.  General: Cooperative, alert, well developed, in no acute distress. HEENT: Conjunctivae and lids unremarkable. Cardiovascular: Regular rhythm.  Lungs: Normal work of breathing. Neurologic: No focal deficits.   Lab Results  Component Value Date   CREATININE 0.56 08/08/2017   BUN 9 08/08/2017   NA 139 08/08/2017   K 3.9 08/08/2017   CL 105 08/08/2017   CO2 23 08/08/2017   Lab Results  Component Value Date   ALT 18 01/22/2017   AST 15 01/22/2017   ALKPHOS 70 01/22/2017   BILITOT 0.3 01/22/2017   Lab Results  Component Value Date   HGBA1C 6.2 (H) 12/01/2019   HGBA1C 6.4 04/20/2017   HGBA1C 6.5 01/22/2017   HGBA1C 5.8 09/04/2014   HGBA1C 5.2 11/04/2013   Lab Results  Component Value Date   INSULIN 13.9 12/01/2019   Lab Results  Component Value Date   TSH 1.15 01/22/2017   Lab Results  Component Value Date   CHOL 144 01/22/2017   HDL 52.90 01/22/2017   LDLCALC 81 01/22/2017   TRIG 50.0 01/22/2017   CHOLHDL 3 01/22/2017  Lab Results  Component Value Date   WBC 10.5 08/08/2017   HGB 14.0 08/08/2017   HCT 43.3 08/08/2017   MCV 82.5 08/08/2017   PLT 293 08/08/2017   No results found for: IRON, TIBC, FERRITIN  Attestation Statements:   Reviewed by clinician on day of visit: allergies, medications, problem list, medical history, surgical history, family history, social history, and previous encounter notes.  I, Michaelene Song, am acting as transcriptionist for Coralie Common, MD   I have reviewed the above documentation for accuracy and completeness, and I agree with the above. - Jinny Blossom, MD

## 2019-12-16 ENCOUNTER — Encounter (INDEPENDENT_AMBULATORY_CARE_PROVIDER_SITE_OTHER): Payer: Self-pay | Admitting: Family Medicine

## 2019-12-17 ENCOUNTER — Ambulatory Visit: Payer: BC Managed Care – PPO | Admitting: Podiatry

## 2019-12-17 ENCOUNTER — Encounter: Payer: Self-pay | Admitting: Podiatry

## 2019-12-17 ENCOUNTER — Other Ambulatory Visit: Payer: Self-pay

## 2019-12-17 VITALS — Temp 98.3°F

## 2019-12-17 DIAGNOSIS — M722 Plantar fascial fibromatosis: Secondary | ICD-10-CM | POA: Diagnosis not present

## 2019-12-17 DIAGNOSIS — M779 Enthesopathy, unspecified: Secondary | ICD-10-CM | POA: Diagnosis not present

## 2019-12-17 NOTE — Telephone Encounter (Signed)
Please advise 

## 2019-12-18 NOTE — Progress Notes (Signed)
Subjective:   Patient ID: Natasha Lyons, female   DOB: 59 y.o.   MRN: [id number]   HPI Patient presents stating that both of her feet are improved from previously with the left one being completely better and the right one still showing mild discomfort but not the same area.   ROS      Objective:  Physical Exam  Neurovascular status intact with patient noted to have significant diminishment of discomfort in the lateral side of the left foot with pain only upon deep palpation into the arch and the right sinus tarsi mildly discomforting but much better than previous     Assessment:  Improved with both capsulitis right and fasciitis tendinitis left     Plan:  H&P reviewed condition and discussed topical medications continued oral medications and support therapy.  Patient will be seen back for [place] to recheck again as needed but hopefully we are making good progress with this patient.  I educated her on shoe gear choices and stretching exercises

## 2019-12-18 NOTE — Progress Notes (Signed)
  Office: [phone]  /  Fax: [phone]    Date: January 01, 2020   Appointment Start Time: 1:57am Duration: 31 minutes Provider: Glennie Isle, Psy.D. Type of Session: Individual Therapy  Location of Patient: Home Location of Provider: Provider's Home Type of Contact: Telepsychological Visit via MyChart Video Visit  Session Content: Natasha Lyons is a 59 y.o. female presenting via MyChart Video Visit for a follow-up appointment to address the previously established treatment goal of increasing coping skills. Today's appointment was a telepsychological visit due to COVID-19. Natasha Lyons provided verbal consent for today's telepsychological appointment and she is aware she is responsible for securing confidentiality on her end of the session. Prior to proceeding with today's appointment, Natasha Lyons's physical location at the time of this appointment was obtained as well a phone number she could be reached at in the event of technical difficulties. Natasha Lyons and this provider participated in today's telepsychological service. Of note, this provider switched to a regular telephone call with Natasha Lyons's verbal consent at 2:03pm due to an unstable connection.   This provider conducted a brief check-in. Natasha Lyons reported feeling "frustrated" with progress to date. She reported knee pain and feeling fatigued. The aforementioned has contributed to emotional eating. Her recent eating habits were explored. Emotional and physical hunger were reviewed. Psychoeducation regarding triggers for emotional eating was provided. Natasha Lyons was provided a handout, and encouraged to utilize the handout between now and the next appointment to increase awareness of triggers and frequency. Natasha Lyons agreed. This provider also discussed behavioral strategies for specific triggers, such as placing the utensil down when conversing to avoid mindless eating. Natasha Lyons provided verbal consent during today's appointment for this provider to send a handout about  triggers via e-mail. Natasha Lyons was receptive to today's appointment as evidenced by openness to sharing, responsiveness to feedback, and willingness to explore triggers for emotional eating.  Mental Status Examination:  Appearance: well groomed and appropriate hygiene  Behavior: appropriate to circumstances Mood: "frustrated" Affect: mood congruent Speech: normal in rate, volume, and tone Eye Contact: appropriate Psychomotor Activity: appropriate Gait: unable to assess Thought Process: linear, logical, and goal directed  Thought Content/Perception: no hallucinations, delusions, bizarre thinking or behavior reported or observed and no evidence of suicidal and homicidal ideation, plan, and intent Orientation: time, person, place, and purpose of appointment Memory/Concentration: memory, attention, language, and fund of knowledge intact  Insight/Judgment: good  Interventions:  Conducted a brief chart review Provided empathic reflections and validation Reviewed content from the previous session Employed supportive psychotherapy interventions to facilitate reduced distress and to improve coping skills with identified stressors Employed motivational interviewing skills to assess patient's willingness/desire to adhere to recommended medical treatments and assignments Psychoeducation provided regarding triggers for emotional eating  DSM-5 Diagnosis(es): 300.09 (F41.8) Other Specified Anxiety Disorder, Emotional Eating Behaviors  Treatment Goal & Progress: During the initial appointment with this provider, the following treatment goal was established: increase coping skills. Progress is limited, as Natasha Lyons has just begun treatment with this provider; however, she is receptive to the interaction and interventions and rapport is being established.   Plan: Based on Natasha Lyons's work schedule, the next appointment will be scheduled in 2-3 weeks, which will be via [place] Visit. The next session will  focus on working towards the established treatment goal.

## 2019-12-18 NOTE — Telephone Encounter (Signed)
Can we reschedule her with Dr. Juleen China please?

## 2019-12-22 ENCOUNTER — Encounter (INDEPENDENT_AMBULATORY_CARE_PROVIDER_SITE_OTHER): Payer: Self-pay | Admitting: Family Medicine

## 2019-12-23 NOTE — Telephone Encounter (Signed)
Please advise. Thanks.  

## 2019-12-24 NOTE — Telephone Encounter (Signed)
Please review

## 2020-01-01 ENCOUNTER — Other Ambulatory Visit: Payer: Self-pay

## 2020-01-01 ENCOUNTER — Telehealth (INDEPENDENT_AMBULATORY_CARE_PROVIDER_SITE_OTHER): Payer: BC Managed Care – PPO | Admitting: Psychology

## 2020-01-01 DIAGNOSIS — F418 Other specified anxiety disorders: Secondary | ICD-10-CM

## 2020-01-02 ENCOUNTER — Encounter (INDEPENDENT_AMBULATORY_CARE_PROVIDER_SITE_OTHER): Payer: Self-pay | Admitting: Family Medicine

## 2020-01-05 ENCOUNTER — Ambulatory Visit (INDEPENDENT_AMBULATORY_CARE_PROVIDER_SITE_OTHER): Payer: Self-pay | Admitting: Family Medicine

## 2020-01-05 NOTE — Telephone Encounter (Signed)
Please advise 

## 2020-01-06 DIAGNOSIS — M810 Age-related osteoporosis without current pathological fracture: Secondary | ICD-10-CM | POA: Insufficient documentation

## 2020-01-06 NOTE — Progress Notes (Signed)
  Office: [phone]  /  Fax: [phone]    Date: January 20, 2020   Appointment Start Time: 2:30pm Duration: 21 minutes Provider: Glennie Isle, Psy.D. Type of Session: Individual Therapy  Location of Patient: Home Location of Provider: [facility] Type of Contact: Telepsychological Visit via MyChart Video Visit  Session Content: Natasha Lyons is a 59 y.o. female presenting via [place] Visit for a follow-up appointment to address the previously established treatment goal of increasing coping skills. Today's appointment was a telepsychological visit due to COVID-19. [name] provided verbal consent for today's telepsychological appointment and she is aware she is responsible for securing confidentiality on her end of the session. Prior to proceeding with today's appointment, [name]'s physical location at the time of this appointment was obtained as well a phone number she could be reached at in the event of technical difficulties. [name] and this provider participated in today's telepsychological service.   This provider conducted a brief check-in. [name] shared about a recent psoriatic arthritis flare up. Triggers for emotional eating were reviewed. She acknowledged pain triggers eating as it gives her "energy." Notably, she discussed an increase in protein intake. Psychoeducation regarding mindfulness was provided to assist with coping. A handout was provided to [facility] with further information regarding mindfulness, including exercises. This provider also explained the benefit of mindfulness as it relates to emotional eating. [name] was encouraged to engage in the provided exercises between now and the next appointment with this provider. [name] agreed. During today's appointment, [name] was led through a mindfulness exercise involving her senses. Manha provided verbal consent during today's appointment for this provider to send a handout on mindfulness via e-mail. [name] was  receptive to today's appointment as evidenced by openness to sharing, responsiveness to feedback, and willingness to engage in mindfulness exercises to assist with coping.  Mental Status Examination:  Appearance: well groomed and appropriate hygiene  Behavior: appropriate to circumstances Mood: euthymic Affect: mood congruent Speech: normal in rate, volume, and tone Eye Contact: appropriate Psychomotor Activity: appropriate Gait: unable to assess Thought Process: linear, logical, and goal directed  Thought Content/Perception: no hallucinations, delusions, bizarre thinking or behavior reported or observed and no evidence of suicidal and homicidal ideation, plan, and intent Orientation: time, person, place, and purpose of appointment Memory/Concentration: memory, attention, language, and fund of knowledge intact  Insight/Judgment: fair  Interventions:  Conducted a brief chart review Provided empathic reflections and validation Reviewed content from the previous session Employed supportive psychotherapy interventions to facilitate reduced distress and to improve coping skills with identified stressors Psychoeducation provided regarding mindfulness Engaged patient in mindfulness exercise(s) Employed acceptance and commitment interventions to emphasize mindfulness and acceptance without struggle  DSM-5 Diagnosis(es): 300.09 (F41.8) Other Specified Anxiety Disorder, Emotional Eating Behaviors  Treatment Goal & Progress: During the initial appointment with this provider, the following treatment goal was established: increase coping skills. [name] has demonstrated progress in her goal as evidenced by increased awareness of hunger patterns and increased awareness of triggers for emotional eating. [name] also demonstrates willingness to engage in mindfulness exercises.  Plan: The next appointment will be scheduled in two weeks, which will be via MyChart Video Visit. The next session will focus on  working towards the established treatment goal.

## 2020-01-07 ENCOUNTER — Encounter (INDEPENDENT_AMBULATORY_CARE_PROVIDER_SITE_OTHER): Payer: Self-pay

## 2020-01-07 ENCOUNTER — Other Ambulatory Visit (INDEPENDENT_AMBULATORY_CARE_PROVIDER_SITE_OTHER): Payer: Self-pay | Admitting: Family Medicine

## 2020-01-07 DIAGNOSIS — E1165 Type 2 diabetes mellitus with hyperglycemia: Secondary | ICD-10-CM

## 2020-01-14 ENCOUNTER — Ambulatory Visit (INDEPENDENT_AMBULATORY_CARE_PROVIDER_SITE_OTHER): Payer: BC Managed Care – PPO | Admitting: Family Medicine

## 2020-01-14 ENCOUNTER — Encounter (INDEPENDENT_AMBULATORY_CARE_PROVIDER_SITE_OTHER): Payer: Self-pay | Admitting: Family Medicine

## 2020-01-14 ENCOUNTER — Other Ambulatory Visit: Payer: Self-pay

## 2020-01-14 VITALS — BP 131/81 | HR 73 | Temp 98.2°F | Ht 64.0 in | Wt 241.0 lb

## 2020-01-14 DIAGNOSIS — E1165 Type 2 diabetes mellitus with hyperglycemia: Secondary | ICD-10-CM

## 2020-01-14 DIAGNOSIS — Z9189 Other specified personal risk factors, not elsewhere classified: Secondary | ICD-10-CM | POA: Diagnosis not present

## 2020-01-14 DIAGNOSIS — F418 Other specified anxiety disorders: Secondary | ICD-10-CM

## 2020-01-14 DIAGNOSIS — L4050 Arthropathic psoriasis, unspecified: Secondary | ICD-10-CM | POA: Diagnosis not present

## 2020-01-14 DIAGNOSIS — Z6841 Body Mass Index (BMI) 40.0 and over, adult: Secondary | ICD-10-CM

## 2020-01-15 NOTE — Progress Notes (Signed)
Chief Complaint:   OBESITY Natasha Lyons is here to discuss her progress with her obesity treatment plan along with follow-up of her obesity related diagnoses. Natasha Lyons is on the Category 3 Plan and states she is following her eating plan approximately 75% of the time. Natasha Lyons states she is exercising for 0 minutes 0 times per week.  Today's visit was #: 3 Starting weight: 240 lbs Starting date: 12/01/2019 Today's weight: 241 lbs Today's date: 01/14/2020 Total lbs lost to date: 0 Total lbs lost since last in-office visit: 0  Interim History: Natasha Lyons has been working with Dr. Jearld Shines and Dr. Mallie Mussel.  She is interested in gastric sleeve surgery.  She says that pain hinders exercise for her.  Subjective:   1. Psoriatic arthritis ([place]) Natasha Lyons has the diagnosis of psoriatic arthritis and is followed by Rheumatology/Dermatology.  2. Type 2 diabetes mellitus with hyperglycemia, without long-term current use of insulin (HCC) Medications reviewed. Diabetic ROS: no polyuria or polydipsia, no chest pain, dyspnea or TIA's, no numbness, tingling or pain in extremities.   Lab Results  Component Value Date   HGBA1C 6.2 (H) 12/01/2019   HGBA1C 6.4 04/20/2017   HGBA1C 6.5 01/22/2017   Lab Results  Component Value Date   LDLCALC 81 01/22/2017   CREATININE 0.56 08/08/2017   Lab Results  Component Value Date   INSULIN 13.9 12/01/2019   3. Other Specified Anxiety Disorder, Emotional Eating Behaviors Natasha Lyons is working with Dr. Mallie Mussel for help with her emotional eating.  4. At risk for constipation Natasha Lyons is at increased risk for constipation due to inadequate water intake, changes in diet, and/or use of medications such as GLP1 agonists. Natasha Lyons denies hard, infrequent stools currently.   Assessment/Plan:   1. Psoriatic arthritis ([place]) Will continue to monitor. This issue directly impacts care plan for optimization of BMI and metabolic health as it impacts the patient's ability to make lifestyle  changes.  2. Type 2 diabetes mellitus with hyperglycemia, without long-term current use of insulin (HCC) Good blood sugar control is important to decrease the likelihood of diabetic complications such as nephropathy, neuropathy, limb loss, blindness, coronary artery disease, and death. Intensive lifestyle modification including diet, exercise and weight loss are the first line of treatment for diabetes.  - Semaglutide,0.25 or 0.5MG /DOS, (OZEMPIC, 0.25 OR 0.5 MG/DOSE,) 2 MG/1.5ML SOPN; Inject 0.1875 mLs (0.25 mg total) into the skin once a week.  Dispense: 4 pen; Refill: 0  3. Other Specified Anxiety Disorder, Emotional Eating Behaviors Good blood sugar control is important to decrease the likelihood of diabetic complications such as nephropathy, neuropathy, limb loss, blindness, coronary artery disease, and death. Intensive lifestyle modification including diet, exercise and weight loss are the first line of treatment for diabetes.  She will continue to follow with Dr. Mallie Mussel.  4. At risk for constipation Natasha Lyons was given approximately 15 minutes of counseling today regarding prevention of constipation. She was encouraged to increase water and fiber intake.   5. Class 3 severe obesity with serious comorbidity and body mass index (BMI) of 40.0 to 44.9 in adult, unspecified obesity type (HCC) Natasha Lyons is currently in the action stage of change. As such, her goal is to continue with weight loss efforts. She has agreed to the Category 3 Plan.   Exercise goals: For substantial health benefits, adults should do at least 150 minutes (2 hours and 30 minutes) a week of moderate-intensity, or 75 minutes (1 hour and 15 minutes) a week of vigorous-intensity aerobic physical activity, or an equivalent  combination of moderate- and vigorous-intensity aerobic activity. Aerobic activity should be performed in episodes of at least 10 minutes, and preferably, it should be spread throughout the week.  Behavioral  modification strategies: increasing lean protein intake.  Natasha Lyons has agreed to follow-up with our clinic in 2-3 weeks. She was informed of the importance of frequent follow-up visits to maximize her success with intensive lifestyle modifications for her multiple health conditions.   Objective:   Blood pressure 131/81, pulse 73, temperature 98.2 F (36.8 C), temperature source Oral, height 5\' 4"  (1.626 m), weight 241 lb (109.3 kg), SpO2 96 %. Body mass index is 41.37 kg/m.  General: Cooperative, alert, well developed, in no acute distress. HEENT: Conjunctivae and lids unremarkable. Cardiovascular: Regular rhythm.  Lungs: Normal work of breathing. Neurologic: No focal deficits.   Lab Results  Component Value Date   CREATININE 0.56 08/08/2017   BUN 9 08/08/2017   NA 139 08/08/2017   K 3.9 08/08/2017   CL 105 08/08/2017   CO2 23 08/08/2017   Lab Results  Component Value Date   ALT 18 01/22/2017   AST 15 01/22/2017   ALKPHOS 70 01/22/2017   BILITOT 0.3 01/22/2017   Lab Results  Component Value Date   HGBA1C 6.2 (H) 12/01/2019   HGBA1C 6.4 04/20/2017   HGBA1C 6.5 01/22/2017   HGBA1C 5.8 09/04/2014   HGBA1C 5.2 11/04/2013   Lab Results  Component Value Date   INSULIN 13.9 12/01/2019   Lab Results  Component Value Date   TSH 1.15 01/22/2017   Lab Results  Component Value Date   CHOL 144 01/22/2017   HDL 52.90 01/22/2017   LDLCALC 81 01/22/2017   TRIG 50.0 01/22/2017   CHOLHDL 3 01/22/2017   Lab Results  Component Value Date   WBC 10.5 08/08/2017   HGB 14.0 08/08/2017   HCT 43.3 08/08/2017   MCV 82.5 08/08/2017   PLT 293 08/08/2017   Attestation Statements:   Reviewed by clinician on day of visit: allergies, medications, problem list, medical history, surgical history, family history, social history, and previous encounter notes.  I, Water quality scientist, CMA, am acting as transcriptionist for Briscoe Deutscher, DO  I have reviewed the above documentation for accuracy  and completeness, and I agree with the above. Briscoe Deutscher, DO

## 2020-01-16 ENCOUNTER — Other Ambulatory Visit: Payer: Self-pay | Admitting: Rheumatology

## 2020-01-16 DIAGNOSIS — M25561 Pain in right knee: Secondary | ICD-10-CM

## 2020-01-18 MED ORDER — OZEMPIC (0.25 OR 0.5 MG/DOSE) 2 MG/1.5ML ~~LOC~~ SOPN: 0.2500 mg | PEN_INJECTOR | SUBCUTANEOUS | 0 refills | Status: DC

## 2020-01-18 MED ORDER — INSULIN PEN NEEDLE 32G X 4 MM MISC: 1.0000 | 0 refills | Status: DC

## 2020-01-20 ENCOUNTER — Other Ambulatory Visit: Payer: Self-pay

## 2020-01-20 ENCOUNTER — Telehealth (INDEPENDENT_AMBULATORY_CARE_PROVIDER_SITE_OTHER): Payer: BC Managed Care – PPO | Admitting: Psychology

## 2020-01-20 DIAGNOSIS — F418 Other specified anxiety disorders: Secondary | ICD-10-CM

## 2020-01-21 ENCOUNTER — Encounter (INDEPENDENT_AMBULATORY_CARE_PROVIDER_SITE_OTHER): Payer: Self-pay | Admitting: Family Medicine

## 2020-01-21 NOTE — Telephone Encounter (Signed)
Please advise 

## 2020-02-03 ENCOUNTER — Telehealth (INDEPENDENT_AMBULATORY_CARE_PROVIDER_SITE_OTHER): Payer: BC Managed Care – PPO | Admitting: Psychology

## 2020-02-03 DIAGNOSIS — F418 Other specified anxiety disorders: Secondary | ICD-10-CM | POA: Diagnosis not present

## 2020-02-03 NOTE — Progress Notes (Signed)
  Office: [phone]  /  Fax: [phone]    Date: February 03, 2020    Appointment Start Time: 4:30pm Duration: 22 minutes Provider: Glennie Isle, Psy.D. Type of Session: Individual Therapy  Location of Patient: Home Location of Provider: [facility] Type of Contact: Telepsychological Visit via MyChart Video Visit  Session Content: Natasha Lyons is a 60 y.o. female presenting via [place] Visit for a follow-up appointment to address the previously established treatment goal of increasing coping skills. Today's appointment was a telepsychological visit due to COVID-19. Natasha Lyons provided verbal consent for today's telepsychological appointment and she is aware she is responsible for securing confidentiality on her end of the session. Prior to proceeding with today's appointment, Natasha Lyons's physical location at the time of this appointment was obtained as well a phone number she could be reached at in the event of technical difficulties. Natasha Lyons and this provider participated in today's telepsychological service.   This provider conducted a brief check-in. Natasha Lyons reported, "I got a new job." She also discussed feeling more control with her life. Regarding eating, Natasha Lyons stated, "It's going good," but acknowledged challenges with protein intake. She indicated making a protein shake has been helpful. Natasha Lyons expressed worry about an upcoming cookout for her daughter. Thus, psychoeducation regarding making better choices and engaging in portion control during the holidays and celebrations was provided. More specifically, this provider discussed the following strategies: coming to holiday /celebration meals hungry, but not starving; avoid filling up on appetizers; managing portion sizes; not completely depriving yourself; making the plate colorful (e.g., vegetables); pacing yourself (e.g., waiting 10 minutes before going back for seconds); taking advantage of the nutritious foods; practicing  mindfulness; staying hydrated; and avoid keeping leftovers. Overall,  Natasha Lyons was receptive to today's appointment as evidenced by openness to sharing, responsiveness to feedback, and willingness to implement discussed strategies .  Mental Status Examination:  Appearance: well groomed and appropriate hygiene  Behavior: appropriate to circumstances Mood: euthymic Affect: mood congruent Speech: normal in rate, volume, and tone Eye Contact: appropriate Psychomotor Activity: appropriate Gait: unable to assess Thought Process: linear, logical, and goal directed  Thought Content/Perception: no hallucinations, delusions, bizarre thinking or behavior reported or observed and no evidence of suicidal and homicidal ideation, plan, and intent Orientation: time, person, place, and purpose of appointment Memory/Concentration: memory, attention, language, and fund of knowledge intact  Insight/Judgment: good  Interventions:  Conducted a brief chart review Provided empathic reflections and validation Employed supportive psychotherapy interventions to facilitate reduced distress and to improve coping skills with identified stressors Psychoeducation regarding strategies for celebation eating was provided  DSM-5 Diagnosis(es): 300.09 (F41.8) Other Specified Anxiety Disorder, Emotional Eating Behaviors  Treatment Goal & Progress: During the initial appointment with this provider, the following treatment goal was established: increase coping skills. Natasha Lyons has demonstrated progress in her goal as evidenced by increased awareness of hunger patterns and increased awareness of triggers for emotional eating. Natasha Lyons also continues to demonstrate willingness to engage in learned skill(s).  Plan: Based on Evalina's work schedule, the next appointment will be scheduled in three weeks, which will be via [place] Visit. The next session will focus on working towards the established treatment goal.

## 2020-02-06 DIAGNOSIS — Z8742 Personal history of other diseases of the female genital tract: Secondary | ICD-10-CM | POA: Insufficient documentation

## 2020-02-09 DIAGNOSIS — R7303 Prediabetes: Secondary | ICD-10-CM | POA: Insufficient documentation

## 2020-02-09 DIAGNOSIS — E119 Type 2 diabetes mellitus without complications: Secondary | ICD-10-CM | POA: Insufficient documentation

## 2020-02-09 NOTE — Progress Notes (Signed)
Chief Complaint:   OBESITY Natasha Lyons is here to discuss her progress with her obesity treatment plan along with follow-up of her obesity related diagnoses. Natasha Lyons is on the Category 3 Plan and states she is following her eating plan approximately 80% of the time. Natasha Lyons states she is exercising for 0 minutes 0 times per week.  Today's visit was #: 4 Starting weight: 240 lbs Starting date: 12/01/2019 Today's weight: 236 lbs Today's date: 02/10/2020 Total lbs lost to date: 4 lbs Total lbs lost since last in-office visit: 5 lbs  Interim History: Natasha Lyons is on Ozempic 0.25 mg weekly.  She says she feels that she has more control, with her decreased appetite.  She will be seeing Rheumatology today.  She has stopped eating cheese and yogurt to see if decreased her inflammation. She feels that this was somewhat helpful.  She has a new job with [employer].  Subjective:   1. Psoriatic arthritis ([employer]) Natasha Lyons is followed by Rheumatology for psoriatic arthritis.  She is taking methotrexate and Cosentyx.  2. Type 2 diabetes mellitus with other specified complication, without long-term current use of insulin (HCC) Medications reviewed. Diabetic ROS: no polyuria or polydipsia, no chest pain, dyspnea or TIA's, no numbness, tingling or pain in extremities.   Lab Results  Component Value Date   HGBA1C 6.2 (H) 12/01/2019   HGBA1C 6.4 04/20/2017   HGBA1C 6.5 01/22/2017   Lab Results  Component Value Date   LDLCALC 81 01/22/2017   CREATININE 0.56 08/08/2017   Lab Results  Component Value Date   INSULIN 13.9 12/01/2019   3. Vitamin D deficiency She is currently taking no vitamin D supplement.   4. Emotional eating behaviors Natasha Lyons is struggling with emotional eating and using food for comfort to the extent that it is negatively impacting her health. She has been working on behavior modification techniques to help reduce her emotional eating and has been minimally successful. She shows no  sign of suicidal or homicidal ideations.  5. At risk for heart disease Natasha Lyons is at a higher than average risk for cardiovascular disease due to obesity.   The ASCVD Risk score Natasha Bussing DC Jr., et al., 2013) failed to calculate for the following reasons:   Cannot find a previous HDL lab   Cannot find a previous total cholesterol lab  Assessment/Plan:   1. Psoriatic arthritis ([place]) Followed by Rheumatology for this problem. We will continue to monitor.  2. Type 2 diabetes mellitus with other specified complication, without long-term current use of insulin (HCC) Good blood sugar control is important to decrease the likelihood of diabetic complications such as nephropathy, neuropathy, limb loss, blindness, coronary artery disease, and death. Intensive lifestyle modification including diet, exercise and weight loss are the first line of treatment for diabetes.   3. Vitamin D deficiency Low Vitamin D level contributes to fatigue and are associated with obesity, breast, and colon cancer.  4. Emotional eating behaviors Behavior modification techniques were discussed today to help Natasha Lyons deal with her emotional/non-hunger eating behaviors.  Orders and follow up as documented in patient record.   5. At risk for heart disease Natasha Lyons was given approximately 15 minutes of coronary artery disease prevention counseling today. She is 59 y.o. female and has risk factors for heart disease including obesity. We discussed intensive lifestyle modifications today with an emphasis on specific weight loss instructions and strategies.   Repetitive spaced learning was employed today to elicit superior memory formation and behavioral change.  6.  Class 3 severe obesity with serious comorbidity and body mass index (BMI) of 40.0 to 44.9 in adult, unspecified obesity type (HCC) Natasha Lyons is currently in the action stage of change. As such, her goal is to continue with weight loss efforts. She has agreed to the Category 3  Plan.   Exercise goals: For substantial health benefits, adults should do at least 150 minutes (2 hours and 30 minutes) a week of moderate-intensity, or 75 minutes (1 hour and 15 minutes) a week of vigorous-intensity aerobic physical activity, or an equivalent combination of moderate- and vigorous-intensity aerobic activity. Aerobic activity should be performed in episodes of at least 10 minutes, and preferably, it should be spread throughout the week.  Behavioral modification strategies: increasing lean protein intake and decreasing simple carbohydrates.  Kriya has agreed to follow-up with our clinic in 2-3 weeks. She was informed of the importance of frequent follow-up visits to maximize her success with intensive lifestyle modifications for her multiple health conditions.   Objective:   Blood pressure 140/81, pulse 63, temperature 97.7 F (36.5 C), temperature source Oral, height 5\' 4"  (1.626 m), weight 236 lb (107 kg), SpO2 96 %. Body mass index is 40.51 kg/m.  General: Cooperative, alert, well developed, in no acute distress. HEENT: Conjunctivae and lids unremarkable. Cardiovascular: Regular rhythm.  Lungs: Normal work of breathing. Neurologic: No focal deficits.   Lab Results  Component Value Date   CREATININE 0.56 08/08/2017   BUN 9 08/08/2017   NA 139 08/08/2017   K 3.9 08/08/2017   CL 105 08/08/2017   CO2 23 08/08/2017   Lab Results  Component Value Date   ALT 18 01/22/2017   AST 15 01/22/2017   ALKPHOS 70 01/22/2017   BILITOT 0.3 01/22/2017   Lab Results  Component Value Date   HGBA1C 6.2 (H) 12/01/2019   HGBA1C 6.4 04/20/2017   HGBA1C 6.5 01/22/2017   HGBA1C 5.8 09/04/2014   HGBA1C 5.2 11/04/2013   Lab Results  Component Value Date   INSULIN 13.9 12/01/2019   Lab Results  Component Value Date   TSH 1.15 01/22/2017   Lab Results  Component Value Date   CHOL 144 01/22/2017   HDL 52.90 01/22/2017   LDLCALC 81 01/22/2017   TRIG 50.0 01/22/2017    CHOLHDL 3 01/22/2017   Lab Results  Component Value Date   WBC 10.5 08/08/2017   HGB 14.0 08/08/2017   HCT 43.3 08/08/2017   MCV 82.5 08/08/2017   PLT 293 08/08/2017   Attestation Statements:   Reviewed by clinician on day of visit: allergies, medications, problem list, medical history, surgical history, family history, social history, and previous encounter notes.  I, Water quality scientist, CMA, am acting as transcriptionist for Briscoe Deutscher, DO  I have reviewed the above documentation for accuracy and completeness, and I agree with the above. Briscoe Deutscher, DO

## 2020-02-10 ENCOUNTER — Ambulatory Visit (INDEPENDENT_AMBULATORY_CARE_PROVIDER_SITE_OTHER): Payer: BC Managed Care – PPO | Admitting: Family Medicine

## 2020-02-10 ENCOUNTER — Other Ambulatory Visit: Payer: Self-pay

## 2020-02-10 ENCOUNTER — Encounter (INDEPENDENT_AMBULATORY_CARE_PROVIDER_SITE_OTHER): Payer: Self-pay | Admitting: Family Medicine

## 2020-02-10 VITALS — BP 140/81 | HR 63 | Temp 97.7°F | Ht 64.0 in | Wt 236.0 lb

## 2020-02-10 DIAGNOSIS — Z9189 Other specified personal risk factors, not elsewhere classified: Secondary | ICD-10-CM | POA: Diagnosis not present

## 2020-02-10 DIAGNOSIS — E66813 Obesity, class 3: Secondary | ICD-10-CM

## 2020-02-10 DIAGNOSIS — E1169 Type 2 diabetes mellitus with other specified complication: Secondary | ICD-10-CM | POA: Diagnosis not present

## 2020-02-10 DIAGNOSIS — F418 Other specified anxiety disorders: Secondary | ICD-10-CM | POA: Diagnosis not present

## 2020-02-10 DIAGNOSIS — E559 Vitamin D deficiency, unspecified: Secondary | ICD-10-CM | POA: Diagnosis not present

## 2020-02-10 DIAGNOSIS — L4050 Arthropathic psoriasis, unspecified: Secondary | ICD-10-CM | POA: Diagnosis not present

## 2020-02-10 DIAGNOSIS — Z6841 Body Mass Index (BMI) 40.0 and over, adult: Secondary | ICD-10-CM

## 2020-02-10 NOTE — Progress Notes (Signed)
  Office: [phone]  /  Fax: [phone]    Date: February 24, 2020   Appointment Start Time: 4:32pm Duration: 26 minutes Provider: Glennie Isle, Psy.D. Type of Session: Individual Therapy  Location of Patient: Home Location of Provider: [facility] Type of Contact: Telepsychological Visit via MyChart Video Visit  Session Content: Natasha Lyons is a 59 y.o. female presenting via [place] Visit for a follow-up appointment to address the previously established treatment goal of increasing coping skills. Today's appointment was a telepsychological visit due to COVID-19. Natasha Lyons provided verbal consent for today's telepsychological appointment and she is aware she is responsible for securing confidentiality on her end of the session. Prior to proceeding with today's appointment, Natasha Lyons's physical location at the time of this appointment was obtained as well a phone number she could be reached at in the event of technical difficulties. Natasha Lyons and this provider participated in today's telepsychological service.   This provider conducted a brief check-in. Natasha Lyons stated she started her new job yesterday. She also shared she hurt her knee this summer and she reportedly has a meniscus tear. She also learned she will need both knees replaced. Associated thoughts and feelings were briefly processed. Natasha Lyons further shared she is doing well with her meal plan, adding that learning about her knees has further motivated her to keep going with her weight loss journey/lifestyle change. Session focused further on mindfulness to assist with coping. Natasha Lyons was led through a mindfulness exercise (A Taste of Mindfulness) and her experience was processed. Natasha Lyons provided verbal consent during today's appointment for this provider to send the handout for today's handout via e-mail. This provider discussed the utilization of YouTube for mindfulness exercises (e.g., exercises by Merri Ray). Furthermore,  termination planning was discussed. Natasha Lyons was receptive to a follow-up appointment in 3-4 weeks and an additional follow-up/termination appointment in 3-4 weeks after that. Natasha Lyons was receptive to today's appointment as evidenced by openness to sharing, responsiveness to feedback, and willingness to continue engaging in mindfulness exercises.  Mental Status Examination:  Appearance: well groomed and appropriate hygiene  Behavior: appropriate to circumstances Mood: euthymic Affect: mood congruent Speech: normal in rate, volume, and tone Eye Contact: appropriate Psychomotor Activity: appropriate Gait: unable to assess Thought Process: linear, logical, and goal directed  Thought Content/Perception: no hallucinations, delusions, bizarre thinking or behavior reported or observed and no evidence of suicidal and homicidal ideation, plan, and intent Orientation: time, person, place, and purpose of appointment Memory/Concentration: memory, attention, language, and fund of knowledge intact  Insight/Judgment: good  Interventions:  Conducted a brief chart review Provided empathic reflections and validation Employed supportive psychotherapy interventions to facilitate reduced distress and to improve coping skills with identified stressors Psychoeducation provided regarding mindfulness Engaged patient in mindfulness exercise(s) Employed acceptance and commitment interventions to emphasize mindfulness and acceptance without struggle  Discussed termination planning  DSM-5 Diagnosis(es): 300.09 (F41.8) Other Specified Anxiety Disorder, Emotional Eating Behaviors  Treatment Goal & Progress: During the initial appointment with this provider, the following treatment goal was established: increase coping skills. Natasha Lyons has demonstrated progress in her goal as evidenced by increased awareness of hunger patterns and increased awareness of triggers for emotional eating. Natasha Lyons also continues to demonstrate  willingness to engage in learned skill(s).  Plan: The next appointment will be scheduled in 3-4 weeks, which will be via MyChart Video Visit. The next session will focus on working towards the established treatment goal.

## 2020-02-15 ENCOUNTER — Other Ambulatory Visit: Payer: BC Managed Care – PPO

## 2020-02-16 ENCOUNTER — Ambulatory Visit
Admission: RE | Admit: 2020-02-16 | Discharge: 2020-02-16 | Disposition: A | Discharge status: Home / Self Care | Payer: BC Managed Care – PPO | Source: Ambulatory Visit | Attending: Rheumatology | Admitting: Rheumatology

## 2020-02-16 ENCOUNTER — Other Ambulatory Visit: Payer: Self-pay

## 2020-02-16 DIAGNOSIS — M25561 Pain in right knee: Secondary | ICD-10-CM

## 2020-02-24 ENCOUNTER — Other Ambulatory Visit: Payer: Self-pay

## 2020-02-24 ENCOUNTER — Telehealth (INDEPENDENT_AMBULATORY_CARE_PROVIDER_SITE_OTHER): Payer: BC Managed Care – PPO | Admitting: Psychology

## 2020-02-24 DIAGNOSIS — F418 Other specified anxiety disorders: Secondary | ICD-10-CM | POA: Diagnosis not present

## 2020-02-29 ENCOUNTER — Other Ambulatory Visit: Payer: BC Managed Care – PPO

## 2020-03-08 NOTE — Progress Notes (Signed)
  Office: [phone]  /  Fax: [phone]    Date: March 22, 2020   Appointment Start Time: 4:31pm Duration: 30 minutes Provider: Glennie Isle, Psy.D. Type of Session: Individual Therapy  Location of Patient: Parked in car  Location of Provider: Provider's Home Type of Contact: Telepsychological Visit via MyChart Video Visit  Session Content: Natasha Lyons is a 59 y.o. female presenting for a follow-up appointment to address the previously established treatment goal of increasing coping skills. Today's appointment was a telepsychological visit due to COVID-19. Natasha Lyons provided verbal consent for today's telepsychological appointment and she is aware she is responsible for securing confidentiality on her end of the session. Prior to proceeding with today's appointment, Natasha Lyons's physical location at the time of this appointment was obtained as well a phone number she could be reached at in the event of technical difficulties. Natasha Lyons and this provider participated in today's telepsychological service. Of note, today's appointment was switched to a regular telephone call at 4:46pm with Natasha Lyons's verbal consent due to technical issues.   This provider conducted a brief check-in. Natasha Lyons reported, "Life is just so busy." She noted the aforementioned has impacted her ability to plan meals and eat regularly. This was further explored and she was engaged in problem solving (e.g., taking protein snacks on the road; doubling recipes). Session also focused on progress to date. She discussed an increase in awareness has helped reduce emotional eating behaviors, make better choices, and engage in portion control. She also shared about visual changes she is noticing aside from the number on the scale. Additionally, despite ongoing stressors, Natasha Lyons reported, "My spirits are lighter." Overall, Natasha Lyons was receptive to today's appointment as evidenced by openness to sharing, responsiveness to feedback, and willingness to  continue engaging in learned skills. She was encouraged to continue engaging in mindfulness exercises. She agreed.   Mental Status Examination:  Appearance: well groomed and appropriate hygiene  Behavior: appropriate to circumstances Mood: euthymic Affect: mood congruent Speech: normal in rate, volume, and tone Eye Contact: appropriate Psychomotor Activity: appropriate Gait: unable to assess Thought Process: linear, logical, and goal directed  Thought Content/Perception: no hallucinations, delusions, bizarre thinking or behavior reported or observed and no evidence of suicidal and homicidal ideation, plan, and intent Orientation: time, person, place, and purpose of appointment Memory/Concentration: memory, attention, language, and fund of knowledge intact  Insight/Judgment: good   Interventions:  Conducted a brief chart review Provided empathic reflections and validation Employed supportive psychotherapy interventions to facilitate reduced distress and to improve coping skills with identified stressors Engaged patient in problem solving  DSM-5 Diagnosis(es): 300.09 (F41.8) Other Specified Anxiety Disorder, Emotional Eating Behaviors  Treatment Goal & Progress: During the initial appointment with this provider, the following treatment goal was established: increase coping skills. Natasha Lyons has demonstrated progress in her goal as evidenced by increased awareness of hunger patterns and increased awareness of triggers for emotional eating. Natasha Lyons also continues to demonstrate willingness to engage in learned skill(s).  Plan: The next appointment will be scheduled in one month, which will be via MyChart Video Visit. The next session will focus on working towards the established treatment goal.

## 2020-03-10 ENCOUNTER — Other Ambulatory Visit: Payer: Self-pay

## 2020-03-10 ENCOUNTER — Encounter (INDEPENDENT_AMBULATORY_CARE_PROVIDER_SITE_OTHER): Payer: Self-pay | Admitting: Family Medicine

## 2020-03-10 ENCOUNTER — Ambulatory Visit (INDEPENDENT_AMBULATORY_CARE_PROVIDER_SITE_OTHER): Payer: BC Managed Care – PPO | Admitting: Family Medicine

## 2020-03-10 VITALS — BP 137/80 | HR 68 | Temp 97.7°F | Ht 66.0 in | Wt 230.0 lb

## 2020-03-10 DIAGNOSIS — L4050 Arthropathic psoriasis, unspecified: Secondary | ICD-10-CM

## 2020-03-10 DIAGNOSIS — Z9189 Other specified personal risk factors, not elsewhere classified: Secondary | ICD-10-CM | POA: Diagnosis not present

## 2020-03-10 DIAGNOSIS — Z6839 Body mass index (BMI) 39.0-39.9, adult: Secondary | ICD-10-CM

## 2020-03-10 DIAGNOSIS — E038 Other specified hypothyroidism: Secondary | ICD-10-CM | POA: Diagnosis not present

## 2020-03-10 DIAGNOSIS — E1165 Type 2 diabetes mellitus with hyperglycemia: Secondary | ICD-10-CM | POA: Diagnosis not present

## 2020-03-10 MED ORDER — OZEMPIC (0.25 OR 0.5 MG/DOSE) 2 MG/1.5ML ~~LOC~~ SOPN: 0.5000 mg | PEN_INJECTOR | SUBCUTANEOUS | 0 refills | Status: DC

## 2020-03-10 NOTE — Progress Notes (Signed)
Chief Complaint:   OBESITY Natasha Lyons is here to discuss her progress with her obesity treatment plan along with follow-up of her obesity related diagnoses. Natasha Lyons is on the Category 3 Plan and states she is following her eating plan approximately 80% of the time. Natasha Lyons states she is exercising for 0 minutes 0 times per week.  Today's visit was #: 5 Starting weight: 240 lbs Starting date: 12/01/2019 Today's weight: 230 lbs Today's date: 03/10/2020 Total lbs lost to date: 10 lbs Total lbs lost since last in-office visit: 6 lbs Total weight loss percentage to date: -4.17%  Interim History: Natasha Lyons is down 10 pounds today.  She is having right knee pain due to a tear in her medial meniscus.  She will eventually need a bilateral TKA.  She says she will be getting gel injections.  She has limitation of movement.   Assessment/Plan:   1. Type 2 diabetes mellitus with hyperglycemia, without long-term current use of insulin (HCC) With polyphagia. Tolerating Semaglutide. Will increase dose. Reviewed potential side effects and red flags.   -Increase semaglutide,0.25 or 0.5MG /DOS, (OZEMPIC, 0.25 OR 0.5 MG/DOSE,) 2 MG/1.5ML SOPN; Inject 0.375 mLs (0.5 mg total) into the skin once a week.  Dispense: 1.5 mL; Refill: 0  2. Psoriatic arthritis ([place]) Stable.  She is followed by Rheumatology.  3. Other specified hypothyroidism This is managed by her PCP.  She is taking Armour Thyroid 15 mg daily.  4. At risk for nausea Natasha Lyons is at risk for nausea due to an increase in Ozempic.We reviewed ways to decrease side effect of nausea through eating small meals, avoiding high sugar or fat foods, and ultimately going back on the dose if not tolerating.   5. Class 2 severe obesity with serious comorbidity and body mass index (BMI) of 39.0 to 39.9 in adult, unspecified obesity type (HCC) Natasha Lyons is currently in the action stage of change. As such, her goal is to continue with weight loss efforts. She has agreed  to the Category 3 Plan.   Exercise goals: For substantial health benefits, adults should do at least 150 minutes (2 hours and 30 minutes) a week of moderate-intensity, or 75 minutes (1 hour and 15 minutes) a week of vigorous-intensity aerobic physical activity, or an equivalent combination of moderate- and vigorous-intensity aerobic activity. Aerobic activity should be performed in episodes of at least 10 minutes, and preferably, it should be spread throughout the week.  Behavioral modification strategies: increasing lean protein intake.  Natasha Lyons has agreed to follow-up with our clinic in 3 weeks. She was informed of the importance of frequent follow-up visits to maximize her success with intensive lifestyle modifications for her multiple health conditions.   Objective:   Blood pressure 137/80, pulse 68, temperature 97.7 F (36.5 C), temperature source Oral, height 5\' 6"  (1.676 m), weight 230 lb (104.3 kg), SpO2 96 %. Body mass index is 37.12 kg/m.  General: Cooperative, alert, well developed, in no acute distress. HEENT: Conjunctivae and lids unremarkable. Cardiovascular: Regular rhythm.  Lungs: Normal work of breathing. Neurologic: No focal deficits.   Lab Results  Component Value Date   CREATININE 0.56 08/08/2017   BUN 9 08/08/2017   NA 139 08/08/2017   K 3.9 08/08/2017   CL 105 08/08/2017   CO2 23 08/08/2017   Lab Results  Component Value Date   ALT 18 01/22/2017   AST 15 01/22/2017   ALKPHOS 70 01/22/2017   BILITOT 0.3 01/22/2017   Lab Results  Component Value Date  HGBA1C 6.2 (H) 12/01/2019   HGBA1C 6.4 04/20/2017   HGBA1C 6.5 01/22/2017   HGBA1C 5.8 09/04/2014   HGBA1C 5.2 11/04/2013   Lab Results  Component Value Date   INSULIN 13.9 12/01/2019   Lab Results  Component Value Date   TSH 1.15 01/22/2017   Lab Results  Component Value Date   CHOL 144 01/22/2017   HDL 52.90 01/22/2017   LDLCALC 81 01/22/2017   TRIG 50.0 01/22/2017   CHOLHDL 3 01/22/2017    Lab Results  Component Value Date   WBC 10.5 08/08/2017   HGB 14.0 08/08/2017   HCT 43.3 08/08/2017   MCV 82.5 08/08/2017   PLT 293 08/08/2017   Attestation Statements:   Reviewed by clinician on day of visit: allergies, medications, problem list, medical history, surgical history, family history, social history, and previous encounter notes.  I, Water quality scientist, CMA, am acting as transcriptionist for Briscoe Deutscher, DO  I have reviewed the above documentation for accuracy and completeness, and I agree with the above. Briscoe Deutscher, DO

## 2020-03-22 ENCOUNTER — Telehealth (INDEPENDENT_AMBULATORY_CARE_PROVIDER_SITE_OTHER): Payer: BC Managed Care – PPO | Admitting: Psychology

## 2020-03-22 DIAGNOSIS — F418 Other specified anxiety disorders: Secondary | ICD-10-CM

## 2020-04-01 ENCOUNTER — Ambulatory Visit (INDEPENDENT_AMBULATORY_CARE_PROVIDER_SITE_OTHER): Payer: BC Managed Care – PPO | Admitting: Family Medicine

## 2020-04-01 ENCOUNTER — Encounter (INDEPENDENT_AMBULATORY_CARE_PROVIDER_SITE_OTHER): Payer: Self-pay | Admitting: Family Medicine

## 2020-04-01 ENCOUNTER — Other Ambulatory Visit: Payer: Self-pay

## 2020-04-01 VITALS — BP 129/81 | HR 63 | Temp 97.6°F | Ht 66.0 in | Wt 228.0 lb

## 2020-04-01 DIAGNOSIS — F418 Other specified anxiety disorders: Secondary | ICD-10-CM

## 2020-04-01 DIAGNOSIS — M25561 Pain in right knee: Secondary | ICD-10-CM | POA: Diagnosis not present

## 2020-04-01 DIAGNOSIS — E559 Vitamin D deficiency, unspecified: Secondary | ICD-10-CM | POA: Diagnosis not present

## 2020-04-01 DIAGNOSIS — Z6836 Body mass index (BMI) 36.0-36.9, adult: Secondary | ICD-10-CM

## 2020-04-01 DIAGNOSIS — E039 Hypothyroidism, unspecified: Secondary | ICD-10-CM | POA: Diagnosis not present

## 2020-04-01 DIAGNOSIS — E1165 Type 2 diabetes mellitus with hyperglycemia: Secondary | ICD-10-CM | POA: Diagnosis not present

## 2020-04-01 DIAGNOSIS — F3289 Other specified depressive episodes: Secondary | ICD-10-CM

## 2020-04-01 DIAGNOSIS — G8929 Other chronic pain: Secondary | ICD-10-CM

## 2020-04-02 LAB — CBC WITH DIFFERENTIAL/PLATELET
Basophils Absolute: 0.1 10*3/uL (ref 0.0–0.2)
Basos: 1 %
EOS (ABSOLUTE): 0.3 10*3/uL (ref 0.0–0.4)
Eos: 3 %
Hematocrit: 40.7 % (ref 34.0–46.6)
Hemoglobin: 13.5 g/dL (ref 11.1–15.9)
Immature Grans (Abs): 0 10*3/uL (ref 0.0–0.1)
Immature Granulocytes: 0 %
Lymphocytes Absolute: 2 10*3/uL (ref 0.7–3.1)
Lymphs: 24 %
MCH: 26.9 pg (ref 26.6–33.0)
MCHC: 33.2 g/dL (ref 31.5–35.7)
MCV: 81 fL (ref 79–97)
Monocytes Absolute: 0.5 10*3/uL (ref 0.1–0.9)
Monocytes: 6 %
Neutrophils Absolute: 5.7 10*3/uL (ref 1.4–7.0)
Neutrophils: 66 %
Platelets: 267 10*3/uL (ref 150–450)
RBC: 5.01 x10E6/uL (ref 3.77–5.28)
RDW: 14.8 % (ref 11.7–15.4)
WBC: 8.7 10*3/uL (ref 3.4–10.8)

## 2020-04-02 LAB — COMPREHENSIVE METABOLIC PANEL
ALT: 13 IU/L (ref 0–32)
AST: 14 IU/L (ref 0–40)
Albumin/Globulin Ratio: 1.8 (ref 1.2–2.2)
Albumin: 4.4 g/dL (ref 3.8–4.9)
Alkaline Phosphatase: 97 IU/L (ref 44–121)
BUN/Creatinine Ratio: 28 — ABNORMAL HIGH (ref 9–23)
BUN: 15 mg/dL (ref 6–24)
Bilirubin Total: 0.3 mg/dL (ref 0.0–1.2)
CO2: 25 mmol/L (ref 20–29)
Calcium: 9.2 mg/dL (ref 8.7–10.2)
Chloride: 102 mmol/L (ref 96–106)
Creatinine, Ser: 0.54 mg/dL — ABNORMAL LOW (ref 0.57–1.00)
GFR calc Af Amer: 121 mL/min/{1.73_m2} (ref >59)
GFR calc non Af Amer: 105 mL/min/{1.73_m2} (ref >59)
Globulin, Total: 2.5 g/dL (ref 1.5–4.5)
Glucose: 99 mg/dL (ref 65–99)
Potassium: 4.9 mmol/L (ref 3.5–5.2)
Sodium: 140 mmol/L (ref 134–144)
Total Protein: 6.9 g/dL (ref 6.0–8.5)

## 2020-04-02 LAB — LIPID PANEL
Chol/HDL Ratio: 3.9 ratio (ref 0.0–4.4)
Cholesterol, Total: 178 mg/dL (ref 100–199)
HDL: 46 mg/dL
LDL Chol Calc (NIH): 116 mg/dL — ABNORMAL HIGH (ref 0–99)
Triglycerides: 89 mg/dL (ref 0–149)
VLDL Cholesterol Cal: 16 mg/dL (ref 5–40)

## 2020-04-02 LAB — TSH: TSH: 2.39 u[IU]/mL (ref 0.450–4.500)

## 2020-04-02 LAB — HEMOGLOBIN A1C
Est. average glucose Bld gHb Est-mCnc: 128 mg/dL
Hgb A1c MFr Bld: 6.1 % — ABNORMAL HIGH (ref 4.8–5.6)

## 2020-04-02 LAB — VITAMIN D 25 HYDROXY (VIT D DEFICIENCY, FRACTURES): Vit D, 25-Hydroxy: 38.2 ng/mL (ref 30.0–100.0)

## 2020-04-05 DIAGNOSIS — M25561 Pain in right knee: Secondary | ICD-10-CM | POA: Insufficient documentation

## 2020-04-05 DIAGNOSIS — F418 Other specified anxiety disorders: Secondary | ICD-10-CM | POA: Insufficient documentation

## 2020-04-05 DIAGNOSIS — F32A Depression, unspecified: Secondary | ICD-10-CM | POA: Insufficient documentation

## 2020-04-05 DIAGNOSIS — G8929 Other chronic pain: Secondary | ICD-10-CM | POA: Insufficient documentation

## 2020-04-05 NOTE — Progress Notes (Signed)
Office: [phone]  /  Fax: [phone]    Date: April 19, 2020   Appointment Start Time: 4:27pm Duration: 40 minutes Provider: Glennie Isle, Psy.D. Type of Session: Individual Therapy  Location of Patient: Home Location of Provider: Provider's Home Type of Contact: Telepsychological Visit via MyChart Video Visit  Session Content: Natasha Lyons is a 59 y.o. female presenting for a follow-up appointment to address the previously established treatment goal of increasing coping skills. Today's appointment was a telepsychological visit due to COVID-19. Natasha Lyons provided verbal consent for today's telepsychological appointment and she is aware she is responsible for securing confidentiality on her end of the session. Prior to proceeding with today's appointment, Natasha Lyons's physical location at the time of this appointment was obtained as well a phone number she could be reached at in the event of technical difficulties. Natasha Lyons and this provider participated in today's telepsychological service.   This provider conducted a brief check-in. Natasha Lyons reported, "I'm doing good." She shared about recent efforts to help her eat congruent to her meal plan. Positive reinforcement was provided. Additionally, this provider and Natasha Lyons discussed the dieting mentality, as well as all or nothing thinking. Moreover, a plan was developed to help Natasha Lyons cope with emotional eating in the future using learned skills. She wrote down the following plan: focus on hydration; be prepared with snacks congruent to the meal plan; pause to ask questions when having urges/cravings to eat (e.g., Am I really hungry?, Is there something bothering me?, and Will I feel better if I eat?); and engage in discussed coping strategies after going through the aforementioned questions. Furthermore, Natasha Lyons described challenges as her mother's birthday is approaching as well as the holidays. It was recommended she speak with her other therapist to perhaps  increase the frequency of appointments during this time frame; Natasha Lyons agreed. Overall, Natasha Lyons was receptive to today's appointment as evidenced by openness to sharing, responsiveness to feedback, and willingness to continue engaging in learned skills.  Mental Status Examination:  Appearance: well groomed and appropriate hygiene  Behavior: appropriate to circumstances Mood: euthymic Affect: mood congruent Speech: normal in rate, volume, and tone Eye Contact: appropriate Psychomotor Activity: appropriate Gait: unable to assess Thought Process: linear, logical, and goal directed  Thought Content/Perception: no hallucinations, delusions, bizarre thinking or behavior reported or observed and no evidence of suicidal and homicidal ideation, plan, and intent Orientation: time, person, place, and purpose of appointment Memory/Concentration: memory, attention, language, and fund of knowledge intact  Insight/Judgment: good  Interventions:  Conducted a brief chart review Provided empathic reflections and validation Employed supportive psychotherapy interventions to facilitate reduced distress and to improve coping skills with identified stressors Psychoeducation provided regarding all-or-nothing thinking Reviewed learned skills  DSM-5 Diagnosis(es): 300.09 (F41.8) Other Specified Anxiety Disorder, Emotional Eating Behaviors  Treatment Goal & Progress: During the initial appointment with this provider, the following treatment goal was established: increase coping skills. Natasha Lyons demonstrated progress in her goal as evidenced by increased awareness of hunger patterns, increased awareness of triggers for emotional eating and reduction in emotional eating. Natasha Lyons also continues to demonstrate willingness to engage in learned skill(s).  Plan: As previously planned, today was Natasha Lyons's last appointment with this provider. She acknowledged understanding that she may request a follow-up appointment with this  provider in the future as long as she is still established with the clinic. Notably, this provider discussed her upcoming maternity leave toward the end of November. Natasha Lyons acknowledged understanding given the uncertain nature of the circumstances, this provider may be out of  the office sooner. This provider recommended she continue meeting with her other therapist; she agreed. All questions/concerns were addressed. Natasha Lyons denied any concerns. No further follow-up planned by this provider.

## 2020-04-05 NOTE — Progress Notes (Signed)
Chief Complaint:   OBESITY Natasha Lyons is here to discuss her progress with her obesity treatment plan along with follow-up of her obesity related diagnoses. Natasha Lyons is on the Category 3 Plan and states she is following her eating plan approximately 75% of the time. Natasha Lyons states she is exercising for 0 minutes 0 times per week.  Today's visit was #: 6 Starting weight: 240 lbs Starting date: 12/01/2019 Today's weight: 228 lbs Today's date: 04/01/2020 Total lbs lost to date: 12 lbs Total lbs lost since last in-office visit: 2 lbs Total weight loss percentage to date: -5.00%  Interim History: Natasha Lyons will have gel injections on 10/19.  Anxiety:  Working is going well.  Her father's dementia is worsening.  The holidays are nearing and she is grieving her mother.  Assessment/Plan:   1. Chronic pain of right knee Will continue to monitor symptoms as they relate to her weight loss journey. This issue directly impacts care plan for optimization of BMI and metabolic health as it impacts the patient's ability to make lifestyle changes.  She is starting gel injections this month.  2. Type 2 diabetes mellitus with hyperglycemia, without long-term current use of insulin (HCC) Diabetes Mellitus: needs improvement Issues reviewed with her: blood sugar goals, complications of diabetes mellitus, hypoglycemia prevention and treatment, exercise, nutrition, and carbohydrate counting.   Lab Results  Component Value Date   HGBA1C 6.1 (H) 04/01/2020   HGBA1C 6.2 (H) 12/01/2019   HGBA1C 6.4 04/20/2017   Lab Results  Component Value Date   LDLCALC 116 (H) 04/01/2020   CREATININE 0.54 (L) 04/01/2020   - CBC with Differential/Platelet - Comprehensive metabolic panel - Hemoglobin A1c - Insulin, Free (Bioactive) - Lipid panel  3. Acquired hypothyroidism Patient with long-standing hypothyroidism, on levothyroxine therapy. She appears euthyroid. Orders and follow up as documented in patient record.  Lab  Results  Component Value Date   TSH 2.390 04/01/2020   FREET4 0.74 12/30/2014    - TSH  4. Vitamin D deficiency Optimal goal > 50 ng/dL. There is also evidence to support a goal of >70 ng/dL in patients with cancer and heart disease. Plan: Continue Vitamin D @50 ,000 IU every week with follow-up for routine testing of Vitamin D at least 2-3 times per year to avoid over-replacement.  - VITAMIN D 25 Hydroxy (Vit-D Deficiency, Fractures)  5. Anxiety disorders, emotional eating Improved.  She is seeing Dr. Mallie Mussel.  Making mindful choices.  We reviewed the eating out guide.    7. Class 2 severe obesity with serious comorbidity and body mass index (BMI) of 36.0 to 36.9 in adult, unspecified obesity type (HCC)  Natasha Lyons is currently in the action stage of change. As such, her goal is to continue with weight loss efforts. She has agreed to the Category 3 Plan.   Exercise goals: For substantial health benefits, adults should do at least 150 minutes (2 hours and 30 minutes) a week of moderate-intensity, or 75 minutes (1 hour and 15 minutes) a week of vigorous-intensity aerobic physical activity, or an equivalent combination of moderate- and vigorous-intensity aerobic activity. Aerobic activity should be performed in episodes of at least 10 minutes, and preferably, it should be spread throughout the week.  Behavioral modification strategies: increasing lean protein intake, decreasing simple carbohydrates, increasing vegetables and increasing water intake.  Natasha Lyons has agreed to follow-up with our clinic in 3 weeks. She was informed of the importance of frequent follow-up visits to maximize her success with intensive lifestyle modifications for  her multiple health conditions.   Natasha Lyons was informed we would discuss her lab results at her next visit unless there is a critical issue that needs to be addressed sooner. Natasha Lyons agreed to keep her next visit at the agreed upon time to discuss these  results.  Objective:   Blood pressure 129/81, pulse 63, temperature 97.6 F (36.4 C), temperature source Oral, height 5\' 6"  (1.676 m), weight 228 lb (103.4 kg), SpO2 97 %. Body mass index is 36.8 kg/m.  General: Cooperative, alert, well developed, in no acute distress. HEENT: Conjunctivae and lids unremarkable. Cardiovascular: Regular rhythm.  Lungs: Normal work of breathing. Neurologic: No focal deficits.   Lab Results  Component Value Date   CREATININE 0.54 (L) 04/01/2020   BUN 15 04/01/2020   NA 140 04/01/2020   K 4.9 04/01/2020   CL 102 04/01/2020   CO2 25 04/01/2020   Lab Results  Component Value Date   ALT 13 04/01/2020   AST 14 04/01/2020   ALKPHOS 97 04/01/2020   BILITOT 0.3 04/01/2020   Lab Results  Component Value Date   HGBA1C 6.1 (H) 04/01/2020   HGBA1C 6.2 (H) 12/01/2019   HGBA1C 6.4 04/20/2017   HGBA1C 6.5 01/22/2017   HGBA1C 5.8 09/04/2014   Lab Results  Component Value Date   INSULIN 13.9 12/01/2019   Lab Results  Component Value Date   TSH 2.390 04/01/2020   Lab Results  Component Value Date   CHOL 178 04/01/2020   HDL 46 04/01/2020   LDLCALC 116 (H) 04/01/2020   TRIG 89 04/01/2020   CHOLHDL 3.9 04/01/2020   Lab Results  Component Value Date   WBC 8.7 04/01/2020   HGB 13.5 04/01/2020   HCT 40.7 04/01/2020   MCV 81 04/01/2020   PLT 267 04/01/2020   Attestation Statements:   Reviewed by clinician on day of visit: allergies, medications, problem list, medical history, surgical history, family history, social history, and previous encounter notes.  I, Water quality scientist, CMA, am acting as transcriptionist for Briscoe Deutscher, DO  I have reviewed the above documentation for accuracy and completeness, and I agree with the above. Briscoe Deutscher, DO

## 2020-04-08 ENCOUNTER — Encounter (INDEPENDENT_AMBULATORY_CARE_PROVIDER_SITE_OTHER): Payer: Self-pay

## 2020-04-08 ENCOUNTER — Other Ambulatory Visit (INDEPENDENT_AMBULATORY_CARE_PROVIDER_SITE_OTHER): Payer: Self-pay | Admitting: Family Medicine

## 2020-04-08 DIAGNOSIS — E1165 Type 2 diabetes mellitus with hyperglycemia: Secondary | ICD-10-CM

## 2020-04-08 NOTE — Telephone Encounter (Signed)
Message sent to pt.

## 2020-04-19 ENCOUNTER — Telehealth (INDEPENDENT_AMBULATORY_CARE_PROVIDER_SITE_OTHER): Payer: BC Managed Care – PPO | Admitting: Psychology

## 2020-04-19 DIAGNOSIS — F418 Other specified anxiety disorders: Secondary | ICD-10-CM

## 2020-04-24 ENCOUNTER — Other Ambulatory Visit (INDEPENDENT_AMBULATORY_CARE_PROVIDER_SITE_OTHER): Payer: Self-pay | Admitting: Family Medicine

## 2020-04-24 DIAGNOSIS — E1165 Type 2 diabetes mellitus with hyperglycemia: Secondary | ICD-10-CM

## 2020-05-05 ENCOUNTER — Other Ambulatory Visit: Payer: Self-pay

## 2020-05-05 ENCOUNTER — Ambulatory Visit (INDEPENDENT_AMBULATORY_CARE_PROVIDER_SITE_OTHER): Payer: BC Managed Care – PPO | Admitting: Family Medicine

## 2020-05-05 VITALS — BP 125/82 | HR 66 | Temp 97.7°F | Ht 64.0 in | Wt 223.0 lb

## 2020-05-05 DIAGNOSIS — E1159 Type 2 diabetes mellitus with other circulatory complications: Secondary | ICD-10-CM

## 2020-05-05 DIAGNOSIS — Z6838 Body mass index (BMI) 38.0-38.9, adult: Secondary | ICD-10-CM

## 2020-05-05 DIAGNOSIS — E039 Hypothyroidism, unspecified: Secondary | ICD-10-CM | POA: Diagnosis not present

## 2020-05-05 DIAGNOSIS — M25561 Pain in right knee: Secondary | ICD-10-CM

## 2020-05-05 DIAGNOSIS — E1169 Type 2 diabetes mellitus with other specified complication: Secondary | ICD-10-CM | POA: Diagnosis not present

## 2020-05-05 DIAGNOSIS — I152 Hypertension secondary to endocrine disorders: Secondary | ICD-10-CM

## 2020-05-05 DIAGNOSIS — E785 Hyperlipidemia, unspecified: Secondary | ICD-10-CM

## 2020-05-05 DIAGNOSIS — G8929 Other chronic pain: Secondary | ICD-10-CM

## 2020-05-05 DIAGNOSIS — E1165 Type 2 diabetes mellitus with hyperglycemia: Secondary | ICD-10-CM

## 2020-05-05 DIAGNOSIS — Z9189 Other specified personal risk factors, not elsewhere classified: Secondary | ICD-10-CM | POA: Diagnosis not present

## 2020-05-05 MED ORDER — OZEMPIC (1 MG/DOSE) 4 MG/3ML ~~LOC~~ SOPN: 1.0000 mg | PEN_INJECTOR | SUBCUTANEOUS | 0 refills | Status: DC

## 2020-05-05 NOTE — Progress Notes (Signed)
Chief Complaint:   OBESITY Natasha Lyons is here to discuss her progress with her obesity treatment plan along with follow-up of her obesity related diagnoses.   Today's visit was #: 7 Starting weight: 240 lbs Starting date: 12/01/2019 Today's weight: 225 lbs Today's date: 05/05/2020 Total lbs lost to date: 15 lbs Body mass index is 38.28 kg/m.  Total weight loss percentage to date: -6.25%  Interim History: Natasha Lyons says she has been adjusting to travel.    Natasha Lyons provided the following food recall today:  Breakfast:  She has a frozen protein drink for breakfast that keeps her full until lunch. Lunch:  [employer]. Dinner:  Meat and vegetables. Snack:  Apple, peanut butter.    Nutrition Plan: Category 3 for 80-85% of the time. Anti-obesity medications: Ozempic 1 mg subcutaneously weekly. Reported side effects: None. Hunger is well controlled controlled. Cravings are well controlled controlled.  Activity: None. Sleep: Sleep is interrupted due to her father's night habits (Alzheimers).  Stress: Caregiver fatigue, work stress.   Assessment/Plan:   1. Type 2 diabetes mellitus with other specified complication, without long-term current use of insulin ([place]) Discussed labs with patient today.  Diabetes Mellitus: stable. Medication: Ozempic 1 mg subcutaneously weekly. She will continue to focus on protein-rich, low simple carbohydrate foods. We reviewed the importance of hydration, regular exercise for stress reduction, and restorative sleep.   - Refill Semaglutide, 1 MG/DOSE, (OZEMPIC, 1 MG/DOSE,) 4 MG/3ML SOPN; Inject 1 mg into the skin once a week.  Dispense: 3 mL; Refill: 0  Lab Results  Component Value Date   HGBA1C 6.1 (H) 04/01/2020   HGBA1C 6.2 (H) 12/01/2019   HGBA1C 6.4 04/20/2017   Lab Results  Component Value Date   LDLCALC 116 (H) 04/01/2020   CREATININE 0.54 (L) 04/01/2020   2. Acquired hypothyroidism Discussed labs with patient today.  Natasha Lyons is taking Armour  Thyroid 15 mg daily. We will continue to monitor symptoms as they relate to her weight loss journey.  Lab Results  Component Value Date   TSH 2.390 04/01/2020   3. Hyperlipidemia associated with type 2 diabetes mellitus ([place]) Discussed labs with patient today.  Natasha Lyons has hyperlipidemia and has been trying to improve her cholesterol levels with intensive lifestyle modification including a low saturated fat diet, exercise and weight loss. She denies any chest pain, claudication or myalgias.  Lab Results  Component Value Date   ALT 13 04/01/2020   AST 14 04/01/2020   ALKPHOS 97 04/01/2020   BILITOT 0.3 04/01/2020   Lab Results  Component Value Date   CHOL 178 04/01/2020   HDL 46 04/01/2020   LDLCALC 116 (H) 04/01/2020   TRIG 89 04/01/2020   CHOLHDL 3.9 04/01/2020   4. Hypertension associated with diabetes ([place]) Discussed labs with patient today.  At goal. Medications: Norvasc 5 mg daily. Plan: Monitor home BP. Diet: Avoid buying foods that are: processed, frozen, or prepackaged to avoid excess salt. Recheck BP at next visit. The patient understands monitoring parameters and red flags.   BP Readings from Last 3 Encounters:  05/05/20 125/82  04/01/20 129/81  03/10/20 137/80   Lab Results  Component Value Date   CREATININE 0.54 (L) 04/01/2020   5. Chronic pain of right knee Chronic.  Will require TKR, probably next year.  Her surgeon is Dr. Berenice Primas at [facility]. We will continue to monitor symptoms as they relate to her weight loss journey. This issue directly impacts care plan for optimization of BMI and metabolic  health as it impacts the patient's ability to make lifestyle changes.  6. At high risk for activity intolerance Natasha Lyons was given approximately 15 minutes of exercise intolerance counseling today. She is 59 y.o. female and has risk factors exercise intolerance including obesity and chronic knee pain. We discussed intensive lifestyle modifications today with an  emphasis on specific weight loss instructions and strategies. Yue will slowly increase activity as tolerated.  7. Class 2 severe obesity with serious comorbidity and body mass index (BMI) of 38.0 to 38.9 in adult, unspecified obesity type (HCC)  Natasha Lyons is currently in the action stage of change. As such, her goal is to continue with weight loss efforts.   Nutrition goals: She has agreed to the Category 3 Plan.   Exercise goals: For substantial health benefits, adults should do at least 150 minutes (2 hours and 30 minutes) a week of moderate-intensity, or 75 minutes (1 hour and 15 minutes) a week of vigorous-intensity aerobic physical activity, or an equivalent combination of moderate- and vigorous-intensity aerobic activity. Aerobic activity should be performed in episodes of at least 10 minutes, and preferably, it should be spread throughout the week. Also, chair exercises.  Behavioral modification strategies: increasing lean protein intake, decreasing simple carbohydrates, increasing vegetables, increasing water intake and decreasing liquid calories.  Natasha Lyons has agreed to follow-up with our clinic in 3 weeks. She was informed of the importance of frequent follow-up visits to maximize her success with intensive lifestyle modifications for her multiple health conditions.   Objective:   Blood pressure 125/82, pulse 66, temperature 97.7 F (36.5 C), height 5\' 4"  (1.626 m), weight 223 lb (101.2 kg), SpO2 97 %. Body mass index is 38.28 kg/m.  General: Cooperative, alert, well developed, in no acute distress. HEENT: Conjunctivae and lids unremarkable. Cardiovascular: Regular rhythm.  Lungs: Normal work of breathing. Neurologic: No focal deficits.   Lab Results  Component Value Date   CREATININE 0.54 (L) 04/01/2020   BUN 15 04/01/2020   NA 140 04/01/2020   K 4.9 04/01/2020   CL 102 04/01/2020   CO2 25 04/01/2020   Lab Results  Component Value Date   ALT 13 04/01/2020   AST 14  04/01/2020   ALKPHOS 97 04/01/2020   BILITOT 0.3 04/01/2020   Lab Results  Component Value Date   HGBA1C 6.1 (H) 04/01/2020   HGBA1C 6.2 (H) 12/01/2019   HGBA1C 6.4 04/20/2017   HGBA1C 6.5 01/22/2017   HGBA1C 5.8 09/04/2014   Lab Results  Component Value Date   INSULIN 13.9 12/01/2019   Lab Results  Component Value Date   TSH 2.390 04/01/2020   Lab Results  Component Value Date   CHOL 178 04/01/2020   HDL 46 04/01/2020   LDLCALC 116 (H) 04/01/2020   TRIG 89 04/01/2020   CHOLHDL 3.9 04/01/2020   Lab Results  Component Value Date   WBC 8.7 04/01/2020   HGB 13.5 04/01/2020   HCT 40.7 04/01/2020   MCV 81 04/01/2020   PLT 267 04/01/2020   Attestation Statements:   Reviewed by clinician on day of visit: allergies, medications, problem list, medical history, surgical history, family history, social history, and previous encounter notes.  I, Water quality scientist, CMA, am acting as transcriptionist for Briscoe Deutscher, DO  I have reviewed the above documentation for accuracy and completeness, and I agree with the above. Briscoe Deutscher, DO

## 2020-05-07 ENCOUNTER — Encounter (INDEPENDENT_AMBULATORY_CARE_PROVIDER_SITE_OTHER): Payer: Self-pay | Admitting: Family Medicine

## 2020-05-13 ENCOUNTER — Encounter (INDEPENDENT_AMBULATORY_CARE_PROVIDER_SITE_OTHER): Payer: Self-pay | Admitting: Family Medicine

## 2020-05-13 NOTE — Telephone Encounter (Signed)
Last seen by Dr.wallace

## 2020-05-13 NOTE — Telephone Encounter (Signed)
Please advise 

## 2020-05-17 NOTE — Telephone Encounter (Signed)
See message about what pt is doing to constipation

## 2020-05-26 ENCOUNTER — Ambulatory Visit (INDEPENDENT_AMBULATORY_CARE_PROVIDER_SITE_OTHER): Payer: BC Managed Care – PPO | Admitting: Family Medicine

## 2020-05-26 ENCOUNTER — Encounter (INDEPENDENT_AMBULATORY_CARE_PROVIDER_SITE_OTHER): Payer: Self-pay | Admitting: Family Medicine

## 2020-05-26 ENCOUNTER — Other Ambulatory Visit: Payer: Self-pay

## 2020-05-26 VITALS — BP 127/78 | HR 65 | Temp 97.7°F | Ht 66.0 in | Wt 224.0 lb

## 2020-05-26 DIAGNOSIS — E1169 Type 2 diabetes mellitus with other specified complication: Secondary | ICD-10-CM

## 2020-05-26 DIAGNOSIS — Z6838 Body mass index (BMI) 38.0-38.9, adult: Secondary | ICD-10-CM

## 2020-05-26 DIAGNOSIS — L4050 Arthropathic psoriasis, unspecified: Secondary | ICD-10-CM

## 2020-05-26 DIAGNOSIS — F3289 Other specified depressive episodes: Secondary | ICD-10-CM | POA: Diagnosis not present

## 2020-05-26 DIAGNOSIS — Z9189 Other specified personal risk factors, not elsewhere classified: Secondary | ICD-10-CM | POA: Diagnosis not present

## 2020-05-26 DIAGNOSIS — K5909 Other constipation: Secondary | ICD-10-CM

## 2020-05-26 NOTE — Progress Notes (Signed)
Chief Complaint:   OBESITY Natasha Lyons is here to discuss her progress with her obesity treatment plan along with follow-up of her obesity related diagnoses.   Today's visit was #: 8 Starting weight: 240 lbs Starting date: 12/01/2019 Today's weight: 224 lbs Today's date: 05/26/2020 Total lbs lost to date: 16 lbs Body mass index is 36.15 kg/m.  Total weight loss percentage to date: -6.67%  Interim History: Natasha Lyons says she had some dairy, which caused a flare of psoriasis arthralgia.   Nutrition Plan: the Category 3 Plan 70% of the time.  Anti-obesity medications: Ozempic. Reported side effects: None. Hunger is moderately controlled controlled. Cravings are moderately controlled controlled.  Activity: Stretching and yoga for 10 minutes 3 times per week.  Assessment/Plan:   1. Type 2 diabetes mellitus with other specified complication, without long-term current use of insulin (HCC) Diabetes Mellitus: stable. Medication: Ozempic. Issues reviewed with her: blood sugar goals, complications of diabetes mellitus, hypoglycemia prevention and treatment, exercise, and nutrition.  Lab Results  Component Value Date   HGBA1C 6.1 (H) 04/01/2020   HGBA1C 6.2 (H) 12/01/2019   HGBA1C 6.4 04/20/2017   Lab Results  Component Value Date   LDLCALC 116 (H) 04/01/2020   CREATININE 0.54 (L) 04/01/2020   2. Other constipation Improved. The current medical regimen is effective;  continue present plan and medications.  3. Psoriatic arthritis ([place]) Natasha Lyons is currently having a flare due to ingesting some dairy. We will continue to monitor symptoms as they relate to her weight loss journey.  4. Other depression, with emotional eating Improved.  Behavior modification techniques were discussed today to help Natasha Lyons deal with her emotional/non-hunger eating behaviors.    5. At risk for heart disease Natasha Lyons was given approximately 15 minutes of coronary artery disease prevention counseling today. She  is 59 y.o. female and has risk factors for heart disease including obesity and DM. We discussed intensive lifestyle modifications today with an emphasis on specific weight loss instructions and strategies. Repetitive spaced learning was employed today to elicit superior memory formation and behavioral change.  6. Class 2 severe obesity with serious comorbidity and body mass index (BMI) of 38.0 to 38.9 in adult, unspecified obesity type ([name])  Course: [name] is currently in the action stage of change. As such, her goal is to continue with weight loss efforts.   Nutrition goals: She has agreed to the Category 3 Plan.  No dairy.   Exercise goals: As is.  Behavioral modification strategies: increasing lean protein intake, decreasing simple carbohydrates, increasing vegetables, increasing water intake and emotional eating strategies.  Natasha Lyons has agreed to follow-up with our clinic in 3-4 weeks. She was informed of the importance of frequent follow-up visits to maximize her success with intensive lifestyle modifications for her multiple health conditions.   Objective:   Blood pressure 127/78, pulse 65, temperature 97.7 F (36.5 C), height 5\' 6"  (1.676 m), weight 224 lb (101.6 kg), SpO2 96 %. Body mass index is 36.15 kg/m.  General: Cooperative, alert, well developed, in no acute distress. HEENT: Conjunctivae and lids unremarkable. Cardiovascular: Regular rhythm.  Lungs: Normal work of breathing. Neurologic: No focal deficits.   Lab Results  Component Value Date   CREATININE 0.54 (L) 04/01/2020   BUN 15 04/01/2020   NA 140 04/01/2020   K 4.9 04/01/2020   CL 102 04/01/2020   CO2 25 04/01/2020   Lab Results  Component Value Date   ALT 13 04/01/2020   AST 14 04/01/2020   ALKPHOS  97 04/01/2020   BILITOT 0.3 04/01/2020   Lab Results  Component Value Date   HGBA1C 6.1 (H) 04/01/2020   HGBA1C 6.2 (H) 12/01/2019   HGBA1C 6.4 04/20/2017   HGBA1C 6.5 01/22/2017   HGBA1C 5.8 09/04/2014    Lab Results  Component Value Date   INSULIN 13.9 12/01/2019   Lab Results  Component Value Date   TSH 2.390 04/01/2020   Lab Results  Component Value Date   CHOL 178 04/01/2020   HDL 46 04/01/2020   LDLCALC 116 (H) 04/01/2020   TRIG 89 04/01/2020   CHOLHDL 3.9 04/01/2020   Lab Results  Component Value Date   WBC 8.7 04/01/2020   HGB 13.5 04/01/2020   HCT 40.7 04/01/2020   MCV 81 04/01/2020   PLT 267 04/01/2020   Attestation Statements:   Reviewed by clinician on day of visit: allergies, medications, problem list, medical history, surgical history, family history, social history, and previous encounter notes.  I, Water quality scientist, CMA, am acting as transcriptionist for Briscoe Deutscher, DO  I have reviewed the above documentation for accuracy and completeness, and I agree with the above. Briscoe Deutscher, DO

## 2020-05-30 ENCOUNTER — Other Ambulatory Visit (INDEPENDENT_AMBULATORY_CARE_PROVIDER_SITE_OTHER): Payer: Self-pay | Admitting: Family Medicine

## 2020-05-30 DIAGNOSIS — E1165 Type 2 diabetes mellitus with hyperglycemia: Secondary | ICD-10-CM

## 2020-05-31 ENCOUNTER — Encounter (INDEPENDENT_AMBULATORY_CARE_PROVIDER_SITE_OTHER): Payer: Self-pay

## 2020-05-31 NOTE — Telephone Encounter (Signed)
Last seen by Dr. Wallace. 

## 2020-05-31 NOTE — Telephone Encounter (Signed)
Message sent to pt.

## 2020-06-22 ENCOUNTER — Encounter (INDEPENDENT_AMBULATORY_CARE_PROVIDER_SITE_OTHER): Payer: Self-pay | Admitting: Family Medicine

## 2020-06-22 ENCOUNTER — Ambulatory Visit (INDEPENDENT_AMBULATORY_CARE_PROVIDER_SITE_OTHER): Payer: BC Managed Care – PPO | Admitting: Family Medicine

## 2020-06-22 ENCOUNTER — Other Ambulatory Visit: Payer: Self-pay

## 2020-06-22 VITALS — BP 127/74 | HR 69 | Temp 97.9°F | Ht 64.0 in | Wt 225.0 lb

## 2020-06-22 DIAGNOSIS — M1711 Unilateral primary osteoarthritis, right knee: Secondary | ICD-10-CM | POA: Diagnosis not present

## 2020-06-22 DIAGNOSIS — E039 Hypothyroidism, unspecified: Secondary | ICD-10-CM

## 2020-06-22 DIAGNOSIS — F3289 Other specified depressive episodes: Secondary | ICD-10-CM

## 2020-06-22 DIAGNOSIS — E1169 Type 2 diabetes mellitus with other specified complication: Secondary | ICD-10-CM | POA: Diagnosis not present

## 2020-06-22 DIAGNOSIS — Z9189 Other specified personal risk factors, not elsewhere classified: Secondary | ICD-10-CM

## 2020-06-22 DIAGNOSIS — Z6838 Body mass index (BMI) 38.0-38.9, adult: Secondary | ICD-10-CM

## 2020-06-22 DIAGNOSIS — L4050 Arthropathic psoriasis, unspecified: Secondary | ICD-10-CM

## 2020-06-22 MED ORDER — OZEMPIC (1 MG/DOSE) 4 MG/3ML ~~LOC~~ SOPN: 1.0000 mg | PEN_INJECTOR | SUBCUTANEOUS | 0 refills | Status: DC

## 2020-06-22 NOTE — Progress Notes (Signed)
Chief Complaint:   OBESITY Natasha Lyons is here to discuss her progress with her obesity treatment plan along with follow-up of her obesity related diagnoses.   Today's visit was #: 9 Starting weight: 240 lbs Starting date: 12/01/2019 Today's weight: 225 lbs Today's date: 06/22/2020 Total lbs lost to date: 15 lbs Body mass index is 36.32 kg/m.  Total weight loss percentage to date: -6.25%  Interim History: Natasha Lyons says she has started working out.  She is using a stationary bike for 15-20 minutes per day.  She says this is her first holiday without her mom and her dad's health is declining.  Her pain is worse, and she is going to see a Restaurant manager, fast food.  She says she still struggles with getting in her protein. Nutrition Plan: the Category 3 Plan for 80% of the time.  Anti-obesity medications: Ozempic 1 mg subcutaneously daily. Reported side effects: None. Hunger is well controlled. Cravings are well controlled.  Activity: Cardio for 15-20 minutes 3-5 times per week.  Assessment/Plan:   1. Psoriatic arthritis (Natasha Lyons) Reviewed Dr. Teodoro Spray note. This issue directly impacts care plan for optimization of BMI and metabolic health as it impacts the patient's ability to make lifestyle changes. We will continue to monitor symptoms as they relate to her weight loss journey.  2. Acquired hypothyroidism Course: Controlled. Medication: Armour Thyroid 15 mg daily..   Lab Results  Component Value Date   TSH 2.390 04/01/2020   Plan: Patient was instructed not to take MVM or iron within 4 hours of taking thyroid medications. We will continue to monitor symptoms as they relate to her weight loss journey.  3. Type 2 diabetes mellitus with other specified complication, without long-term current use of insulin (HCC) Diabetes Mellitus:  Improving. Medication: Ozempic 1 mg subcutaneously weekly. Issues reviewed with her: blood sugar goals, complications of diabetes mellitus, hypoglycemia prevention and  treatment, exercise, and nutrition.  Lab Results  Component Value Date   HGBA1C 6.1 (H) 04/01/2020   HGBA1C 6.2 (H) 12/01/2019   HGBA1C 6.4 04/20/2017   Lab Results  Component Value Date   LDLCALC 116 (H) 04/01/2020   CREATININE 0.54 (L) 04/01/2020   - Increase Semaglutide, 1 MG/DOSE, (OZEMPIC, 1 MG/DOSE,) 4 MG/3ML SOPN; Inject 1 mg into the skin once a week.  Dispense: 3 mL; Refill: 0  4. Osteoarthritis of right knee, primary BMI is at goal for surgery.  She wants to lose another 20-25 pounds.  Goal is TKR around next summer.  5. Other depression, with emotional eating Improving. Natasha Lyons is taking Wellbutrin XL 300 mg daily. Motivational interviewing as well as evidence-based interventions for health behavior change were utilized today including the discussion of self monitoring techniques, problem-solving barriers and SMART goal setting techniques.   6. At risk for deficient intake of food Natasha Lyons was given approximately 8 minutes of deficit intake of food prevention counseling today. Natasha Lyons is at risk for eating too few calories due to being unable to eat more than 5 ounces at a time.  I gave her a bariatric meals book to maximize protein in smaller portions. She was encouraged to focus on meeting caloric and protein goals according to her recommended meal plan.   7. Class 2 severe obesity with serious comorbidity and body mass index (BMI) of 38.0 to 38.9 in adult, unspecified obesity type ([place])  Course: Natasha Lyons is currently in the action stage of change. As such, her goal is to continue with weight loss efforts.   Nutrition goals: She  has agreed to the Category 3 Plan.   Exercise goals: As is.  Behavioral modification strategies: increasing lean protein intake, decreasing simple carbohydrates, increasing vegetables and increasing water intake.  Natasha Lyons has agreed to follow-up with our clinic in 4 weeks. She was informed of the importance of frequent follow-up visits to maximize her  success with intensive lifestyle modifications for her multiple health conditions.   Objective:   Blood pressure 127/74, pulse 69, temperature 97.9 F (36.6 C), temperature source Oral, height 5\' 6"  (1.676 m), weight 225 lb (102.1 kg), SpO2 94 %. Body mass index is 36.32 kg/m.  General: Cooperative, alert, well developed, in no acute distress. HEENT: Conjunctivae and lids unremarkable. Cardiovascular: Regular rhythm.  Lungs: Normal work of breathing. Neurologic: No focal deficits.   Lab Results  Component Value Date   CREATININE 0.54 (L) 04/01/2020   BUN 15 04/01/2020   NA 140 04/01/2020   K 4.9 04/01/2020   CL 102 04/01/2020   CO2 25 04/01/2020   Lab Results  Component Value Date   ALT 13 04/01/2020   AST 14 04/01/2020   ALKPHOS 97 04/01/2020   BILITOT 0.3 04/01/2020   Lab Results  Component Value Date   HGBA1C 6.1 (H) 04/01/2020   HGBA1C 6.2 (H) 12/01/2019   HGBA1C 6.4 04/20/2017   HGBA1C 6.5 01/22/2017   HGBA1C 5.8 09/04/2014   Lab Results  Component Value Date   INSULIN 13.9 12/01/2019   Lab Results  Component Value Date   TSH 2.390 04/01/2020   Lab Results  Component Value Date   CHOL 178 04/01/2020   HDL 46 04/01/2020   LDLCALC 116 (H) 04/01/2020   TRIG 89 04/01/2020   CHOLHDL 3.9 04/01/2020   Lab Results  Component Value Date   WBC 8.7 04/01/2020   HGB 13.5 04/01/2020   HCT 40.7 04/01/2020   MCV 81 04/01/2020   PLT 267 04/01/2020   Attestation Statements:   Reviewed by clinician on day of visit: allergies, medications, problem list, medical history, surgical history, family history, social history, and previous encounter notes.  I, Water quality scientist, CMA, am acting as transcriptionist for Briscoe Deutscher, DO  I have reviewed the above documentation for accuracy and completeness, and I agree with the above. Briscoe Deutscher, DO

## 2020-07-26 ENCOUNTER — Encounter (INDEPENDENT_AMBULATORY_CARE_PROVIDER_SITE_OTHER): Payer: Self-pay

## 2020-07-27 ENCOUNTER — Encounter (INDEPENDENT_AMBULATORY_CARE_PROVIDER_SITE_OTHER): Payer: Self-pay | Admitting: Family Medicine

## 2020-07-28 ENCOUNTER — Other Ambulatory Visit: Payer: Self-pay

## 2020-07-28 ENCOUNTER — Telehealth (INDEPENDENT_AMBULATORY_CARE_PROVIDER_SITE_OTHER): Payer: BC Managed Care – PPO | Admitting: Family Medicine

## 2020-07-28 ENCOUNTER — Encounter (INDEPENDENT_AMBULATORY_CARE_PROVIDER_SITE_OTHER): Payer: Self-pay

## 2020-07-28 DIAGNOSIS — E1169 Type 2 diabetes mellitus with other specified complication: Secondary | ICD-10-CM

## 2020-07-28 DIAGNOSIS — E038 Other specified hypothyroidism: Secondary | ICD-10-CM

## 2020-07-28 DIAGNOSIS — N951 Menopausal and female climacteric states: Secondary | ICD-10-CM

## 2020-07-28 DIAGNOSIS — M1711 Unilateral primary osteoarthritis, right knee: Secondary | ICD-10-CM

## 2020-07-28 DIAGNOSIS — R5383 Other fatigue: Secondary | ICD-10-CM

## 2020-07-28 DIAGNOSIS — Z6838 Body mass index (BMI) 38.0-38.9, adult: Secondary | ICD-10-CM

## 2020-07-28 DIAGNOSIS — F3289 Other specified depressive episodes: Secondary | ICD-10-CM

## 2020-07-28 NOTE — Progress Notes (Signed)
TeleHealth Visit:  Due to the COVID-19 pandemic, this visit was completed with telemedicine (audio/video) technology to reduce patient and provider exposure as well as to preserve personal protective equipment.   Natasha Lyons has verbally consented to this TeleHealth visit. The patient is located at home, the provider is located at the [facility] office. The participants in this visit include the listed provider and patient and. The visit was conducted today via [place].  OBESITY Natasha Lyons is here to discuss her progress with her obesity treatment plan along with follow-up of her obesity related diagnoses.   Today's visit was #: 10 Starting weight: 240 lbs Starting date: 12/01/2019 Today's weight: 224 lbs Today's date: 07/28/2020 Total lbs lost to date: 16 lbs Total weight loss percentage to date: -6.67%  Interim History: Natasha Lyons says that she is logging 1500 calories per day.  She denies cravings or hunger.  She says she weighed 224 pounds last Saturday at home.  She says she is frustrated with the stall in her weight loss.  She has not been sleeping well (tosses and turns) due to pain, but this has been getting better with seeing the chiropractor.  She is interested in HRT.  Nutrition Plan: the Category 3 Plan for 70-80% of the time.  Anti-obesity medications: Ozempic 1 mg subcutaneously daily. Reported side effects: None. Activity: Cardio for 20-30 minutes 2-3 days per week. Sleep: Sleep is not restful.   Assessment/Plan:   1. Type 2 diabetes mellitus with other specified complication, without long-term current use of insulin (HCC) Diabetes Mellitus: At goal.  Medication: Ozempic was increased to 1 mg subcutaneously weekly at last visit.  Issues reviewed with her: blood sugar goals, complications of diabetes mellitus, hypoglycemia prevention and treatment, exercise, and nutrition.  Lab Results  Component Value Date   HGBA1C 6.1 (H) 04/01/2020   HGBA1C 6.2 (H) 12/01/2019    HGBA1C 6.4 04/20/2017   Lab Results  Component Value Date   LDLCALC 116 (H) 04/01/2020   CREATININE 0.54 (L) 04/01/2020   2. Osteoarthritis of right knee Goal is TKR.  Hopefully around summer 2022. BMI goal already met. We will continue to monitor symptoms as they relate to her weight loss journey.  3. Other specified hypothyroidism Medication: She is taking Armour Thyroid 15 mg daily. The current medical regimen is effective;  continue present plan and medications.  Lab Results  Component Value Date   TSH 2.390 04/01/2020   4. 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  Limits ability to focus on self-care. Motivational interviewing as well as evidence-based interventions for health behavior change were utilized today including the discussion of self monitoring techniques, problem-solving barriers and SMART goal setting techniques.   5. Postmenopausal syndrome Interested in discussing options with GYN.  Will refer to Dr. Sumner Boast, GYN.   6. Other depression, with emotional eating Natasha Lyons is taking Wellbutrin XL 300 mg daily.  Behavior modification techniques were discussed today to help Natasha Lyons deal with her emotional/non-hunger eating behaviors.    7. Class 2 severe obesity with serious comorbidity and body mass index (BMI) of 38.0 to 38.9 in adult, unspecified obesity type (HCC)  Natasha Lyons is currently in the action stage of change. As such, her goal is to continue with weight loss efforts. She has agreed to the Category 3 Plan.  Consider decreasing carbohydrates.   Exercise goals: For substantial health benefits, adults should do at least 150 minutes (2 hours and 30 minutes) a week of moderate-intensity, or 75  minutes (1 hour and 15 minutes) a week of vigorous-intensity aerobic physical activity, or an equivalent combination of moderate- and vigorous-intensity aerobic activity. Aerobic activity should be performed in episodes of at least 10 minutes, and preferably,  it should be spread throughout the week.  Behavioral modification strategies: increasing lean protein intake, decreasing simple carbohydrates, increasing vegetables and increasing water intake.  Natasha Lyons has agreed to follow-up with our clinic in 3-4 weeks.  She will arrive 30 minutes early, fasting, for labs and IC.  She was informed of the importance of frequent follow-up visits to maximize her success with intensive lifestyle modifications for her multiple health conditions.  Objective:   VITALS: Per patient if applicable, see vitals. GENERAL: Alert and in no acute distress. CARDIOPULMONARY: No increased WOB. Speaking in clear sentences.  PSYCH: Pleasant and cooperative. Speech normal rate and rhythm. Affect is appropriate. Insight and judgement are appropriate. Attention is focused, linear, and appropriate.  NEURO: Oriented as arrived to appointment on time with no prompting.   Lab Results  Component Value Date   CREATININE 0.54 (L) 04/01/2020   BUN 15 04/01/2020   NA 140 04/01/2020   K 4.9 04/01/2020   CL 102 04/01/2020   CO2 25 04/01/2020   Lab Results  Component Value Date   ALT 13 04/01/2020   AST 14 04/01/2020   ALKPHOS 97 04/01/2020   BILITOT 0.3 04/01/2020   Lab Results  Component Value Date   HGBA1C 6.1 (H) 04/01/2020   HGBA1C 6.2 (H) 12/01/2019   HGBA1C 6.4 04/20/2017   HGBA1C 6.5 01/22/2017   HGBA1C 5.8 09/04/2014   Lab Results  Component Value Date   INSULIN 13.9 12/01/2019   Lab Results  Component Value Date   TSH 2.390 04/01/2020   Lab Results  Component Value Date   CHOL 178 04/01/2020   HDL 46 04/01/2020   LDLCALC 116 (H) 04/01/2020   TRIG 89 04/01/2020   CHOLHDL 3.9 04/01/2020   Lab Results  Component Value Date   WBC 8.7 04/01/2020   HGB 13.5 04/01/2020   HCT 40.7 04/01/2020   MCV 81 04/01/2020   PLT 267 04/01/2020   Attestation Statements:   Reviewed by clinician on day of visit: allergies, medications, problem list, medical history,  surgical history, family history, social history, and previous encounter notes.  I, Water quality scientist, CMA, am acting as transcriptionist for Briscoe Deutscher, DO  I have reviewed the above documentation for accuracy and completeness, and I agree with the above. Briscoe Deutscher, DO

## 2020-08-04 ENCOUNTER — Encounter (INDEPENDENT_AMBULATORY_CARE_PROVIDER_SITE_OTHER): Payer: Self-pay | Admitting: Family Medicine

## 2020-08-05 NOTE — Telephone Encounter (Signed)
Last OV with Dr Wallace 

## 2020-08-28 ENCOUNTER — Other Ambulatory Visit (INDEPENDENT_AMBULATORY_CARE_PROVIDER_SITE_OTHER): Payer: Self-pay | Admitting: Family Medicine

## 2020-08-28 DIAGNOSIS — E1169 Type 2 diabetes mellitus with other specified complication: Secondary | ICD-10-CM

## 2020-08-29 ENCOUNTER — Encounter (INDEPENDENT_AMBULATORY_CARE_PROVIDER_SITE_OTHER): Payer: Self-pay | Admitting: Family Medicine

## 2020-08-30 NOTE — Telephone Encounter (Signed)
Dr.Wallace °

## 2020-08-31 ENCOUNTER — Ambulatory Visit (INDEPENDENT_AMBULATORY_CARE_PROVIDER_SITE_OTHER): Payer: BC Managed Care – PPO | Admitting: Family Medicine

## 2020-08-31 ENCOUNTER — Other Ambulatory Visit: Payer: Self-pay

## 2020-08-31 ENCOUNTER — Encounter (INDEPENDENT_AMBULATORY_CARE_PROVIDER_SITE_OTHER): Payer: Self-pay | Admitting: Family Medicine

## 2020-08-31 VITALS — BP 136/78 | HR 41 | Temp 98.1°F | Ht 64.0 in | Wt 220.0 lb

## 2020-08-31 DIAGNOSIS — E039 Hypothyroidism, unspecified: Secondary | ICD-10-CM | POA: Diagnosis not present

## 2020-08-31 DIAGNOSIS — E1169 Type 2 diabetes mellitus with other specified complication: Secondary | ICD-10-CM

## 2020-08-31 DIAGNOSIS — R5383 Other fatigue: Secondary | ICD-10-CM | POA: Diagnosis not present

## 2020-08-31 DIAGNOSIS — E785 Hyperlipidemia, unspecified: Secondary | ICD-10-CM

## 2020-08-31 DIAGNOSIS — Z6837 Body mass index (BMI) 37.0-37.9, adult: Secondary | ICD-10-CM

## 2020-08-31 DIAGNOSIS — E559 Vitamin D deficiency, unspecified: Secondary | ICD-10-CM

## 2020-08-31 DIAGNOSIS — Z9189 Other specified personal risk factors, not elsewhere classified: Secondary | ICD-10-CM

## 2020-08-31 DIAGNOSIS — D1779 Benign lipomatous neoplasm of other sites: Secondary | ICD-10-CM

## 2020-08-31 MED ORDER — OZEMPIC (1 MG/DOSE) 4 MG/3ML ~~LOC~~ SOPN: 1.0000 mg | PEN_INJECTOR | SUBCUTANEOUS | 0 refills | Status: DC

## 2020-09-01 LAB — CBC WITH DIFFERENTIAL/PLATELET
Basophils Absolute: 0.1 10*3/uL (ref 0.0–0.2)
Basos: 1 %
EOS (ABSOLUTE): 0.3 10*3/uL (ref 0.0–0.4)
Eos: 4 %
Hematocrit: 42.5 % (ref 34.0–46.6)
Hemoglobin: 13.5 g/dL (ref 11.1–15.9)
Immature Grans (Abs): 0 10*3/uL (ref 0.0–0.1)
Immature Granulocytes: 0 %
Lymphocytes Absolute: 2.2 10*3/uL (ref 0.7–3.1)
Lymphs: 26 %
MCH: 26.2 pg — ABNORMAL LOW (ref 26.6–33.0)
MCHC: 31.8 g/dL (ref 31.5–35.7)
MCV: 82 fL (ref 79–97)
Monocytes Absolute: 0.5 10*3/uL (ref 0.1–0.9)
Monocytes: 6 %
Neutrophils Absolute: 5.4 10*3/uL (ref 1.4–7.0)
Neutrophils: 63 %
Platelets: 281 10*3/uL (ref 150–450)
RBC: 5.16 x10E6/uL (ref 3.77–5.28)
RDW: 14.2 % (ref 11.7–15.4)
WBC: 8.5 10*3/uL (ref 3.4–10.8)

## 2020-09-01 LAB — LIPID PANEL
Chol/HDL Ratio: 3.9 ratio (ref 0.0–4.4)
Cholesterol, Total: 166 mg/dL (ref 100–199)
HDL: 43 mg/dL (ref >39)
LDL Chol Calc (NIH): 102 mg/dL — ABNORMAL HIGH (ref 0–99)
Triglycerides: 113 mg/dL (ref 0–149)
VLDL Cholesterol Cal: 21 mg/dL (ref 5–40)

## 2020-09-01 LAB — HEMOGLOBIN A1C
Est. average glucose Bld gHb Est-mCnc: 117 mg/dL
Hgb A1c MFr Bld: 5.7 % — ABNORMAL HIGH (ref 4.8–5.6)

## 2020-09-01 LAB — COMPREHENSIVE METABOLIC PANEL
ALT: 29 IU/L (ref 0–32)
AST: 24 IU/L (ref 0–40)
Albumin/Globulin Ratio: 1.4 (ref 1.2–2.2)
Albumin: 4.2 g/dL (ref 3.8–4.9)
Alkaline Phosphatase: 95 IU/L (ref 44–121)
BUN/Creatinine Ratio: 20 (ref 9–23)
BUN: 12 mg/dL (ref 6–24)
Bilirubin Total: 0.2 mg/dL (ref 0.0–1.2)
CO2: 22 mmol/L (ref 20–29)
Calcium: 9.3 mg/dL (ref 8.7–10.2)
Chloride: 103 mmol/L (ref 96–106)
Creatinine, Ser: 0.59 mg/dL (ref 0.57–1.00)
GFR calc Af Amer: 118 mL/min/{1.73_m2} (ref >59)
GFR calc non Af Amer: 102 mL/min/{1.73_m2} (ref >59)
Globulin, Total: 2.9 g/dL (ref 1.5–4.5)
Glucose: 99 mg/dL (ref 65–99)
Potassium: 4.5 mmol/L (ref 3.5–5.2)
Sodium: 141 mmol/L (ref 134–144)
Total Protein: 7.1 g/dL (ref 6.0–8.5)

## 2020-09-01 LAB — VITAMIN D 25 HYDROXY (VIT D DEFICIENCY, FRACTURES): Vit D, 25-Hydroxy: 38.5 ng/mL (ref 30.0–100.0)

## 2020-09-01 LAB — T4, FREE: Free T4: 1.37 ng/dL (ref 0.82–1.77)

## 2020-09-01 LAB — VITAMIN B12: Vitamin B-12: 769 pg/mL (ref 232–1245)

## 2020-09-01 LAB — TSH: TSH: 2.09 u[IU]/mL (ref 0.450–4.500)

## 2020-09-02 NOTE — Progress Notes (Signed)
Chief Complaint:   OBESITY Natasha Lyons is here to discuss her progress with her obesity treatment plan along with follow-up of her obesity related diagnoses.   Today's visit was #: 11 Starting weight: 240 lbs Starting date: 12/01/2019 Today's weight: 220 lbs Today's date: 08/31/2020 Total lbs lost to date: 20 lbs Body mass index is 37.76 kg/m.  Total weight loss percentage to date: -8.33%  Interim History:  Natasha Lyons is taking doxycycline for sinusitis.  She is feeling fatigued.  She says she is loving overnight oats (flaxseed/chia, Fairlife, oats) 22 grams of protein.  She says she has been focusing on protein and vegetables.  Current Meal Plan: the Category 3 Plan and following a lower carbohydrate, vegetable and lean protein rich diet plan for 75-80% of the time.  Current Exercise Plan: None. Current Anti-Obesity Medications: Ozempic 1 mg subcutaneously weekly. Side effects: None.  This patient is following the prescribed meal plan meal without concerns.  Food recall appears to be consistent with the prescribed plan.  When following the plan, hunger and cravings are well controlled.    Assessment/Plan:   1. Type 2 diabetes mellitus with other specified complication, without long-term current use of insulin (HCC) Diabetes Mellitus: Not at goal. Medication: Ozempic 1 mg subcutaneously weekly. Issues reviewed: blood sugar goals, complications of diabetes mellitus, hypoglycemia prevention and treatment, exercise, and nutrition.   Plan: The importance of regular follow up with PCP and all other specialists as scheduled was stressed to patient today.  Lab Results  Component Value Date   HGBA1C 6.1 (H) 04/01/2020   HGBA1C 6.2 (H) 12/01/2019   Lab Results  Component Value Date   LDLCALC 116 (H) 04/01/2020   CREATININE 0.54 (L) 04/01/2020   - Refill Semaglutide, 1 MG/DOSE, (OZEMPIC, 1 MG/DOSE,) 4 MG/3ML SOPN; Inject 1 mg into the skin once a week.  Dispense: 3 mL; Refill: 0 -  Comprehensive metabolic panel - CBC with Differential/Platelet - Hemoglobin A1c  2. Acquired hypothyroidism Course: Controlled. Medication: Armour Thyroid 15 mg daily.   Plan: Patient was instructed not to take MVM or iron within 4 hours of taking thyroid medications. We will continue to monitor alongside Endocrinology/PCP as it relates to her weight loss journey.   Lab Results  Component Value Date   TSH 2.390 04/01/2020   - T4, free - TSH  3. Hyperlipidemia associated with type 2 diabetes mellitus ([place]) Course: Improving, but not optimized. Lipid-lowering medications: None.   Plan: Dietary changes: Increase soluble fiber, decrease simple carbohydrates, decrease saturated fat. Exercise changes: Moderate to vigorous-intensity aerobic activity 150 minutes per week or as tolerated. We will continue to monitor along with PCP/specialists as it pertains to her weight loss journey.  Lab Results  Component Value Date   CHOL 178 04/01/2020   HDL 46 04/01/2020   LDLCALC 116 (H) 04/01/2020   TRIG 89 04/01/2020   CHOLHDL 3.9 04/01/2020   Lab Results  Component Value Date   ALT 13 04/01/2020   AST 14 04/01/2020   ALKPHOS 97 04/01/2020   BILITOT 0.3 04/01/2020   The 10-year ASCVD risk score Mikey Bussing DC Jr., et al., 2013) is: 7.2%   Values used to calculate the score:     Age: 60 years     Sex: Female     Is Non-Hispanic African American: No     Diabetic: Yes     Tobacco smoker: No     Systolic Blood Pressure: 361 mmHg     Is BP treated: Yes  HDL Cholesterol: 43 mg/dL     Total Cholesterol: 166 mg/dL  - Lipid panel  4. Vitamin D deficiency Not at goal. Current vitamin D is 38.2, tested on 04/01/2020. Optimal goal > 50 ng/dL.   Plan:  Will check vitamin D level today, as per below.  - VITAMIN D 25 Hydroxy (Vit-D Deficiency, Fractures)  5. Other fatigue Natasha Lyons has been more fatigued recently.  - Vitamin B12  6. Lipoma of other specified sites Cervical.  Will refer to  [facility] today.  - Ambulatory referral to General Surgery  7. At risk for activity intolerance Natasha Lyons is at risk for activity intolerance due to psoriatic arthritis and sinusitis. We spent > 8 minutes discussing appropriate exercise and monitoring parameters.  8. Class 2 severe obesity with serious comorbidity and body mass index (BMI) of 37.0 to 37.9 in adult, unspecified obesity type ([place])  Course: Natasha Lyons is currently in the action stage of change. As such, her goal is to continue with weight loss efforts.   Nutrition goals: She has agreed to following a lower carbohydrate, vegetable and lean protein rich diet plan.   Exercise goals: As tolerated.  Behavioral modification strategies: increasing lean protein intake, decreasing simple carbohydrates, increasing vegetables, increasing water intake and decreasing liquid calories.  Natasha Lyons has agreed to follow-up with our clinic in 3 weeks. She was informed of the importance of frequent follow-up visits to maximize her success with intensive lifestyle modifications for her multiple health conditions.   Objective:   Blood pressure 136/78, pulse (!) 41, temperature 98.1 F (36.7 C), temperature source Oral, height 5\' 4"  (1.626 m), weight 220 lb (99.8 kg), SpO2 97 %. Body mass index is 37.76 kg/m.  General: Cooperative, alert, well developed, in no acute distress. HEENT: Conjunctivae and lids unremarkable. Cardiovascular: Regular rhythm.  Lungs: Normal work of breathing. Neurologic: No focal deficits.   Lab Results  Component Value Date   HGBA1C 6.1 (H) 04/01/2020   HGBA1C 6.2 (H) 12/01/2019   HGBA1C 6.4 04/20/2017   HGBA1C 6.5 01/22/2017   Lab Results  Component Value Date   INSULIN 13.9 12/01/2019   Attestation Statements:   Reviewed by clinician on day of visit: allergies, medications, problem list, medical history, surgical history, family history, social history, and previous encounter notes.  I, Network engineer, CMA, am acting as transcriptionist for Briscoe Deutscher, DO  I have reviewed the above documentation for accuracy and completeness, and I agree with the above. Briscoe Deutscher, DO

## 2020-09-08 ENCOUNTER — Ambulatory Visit: Payer: BC Managed Care – PPO | Admitting: Podiatry

## 2020-09-13 ENCOUNTER — Ambulatory Visit: Payer: BC Managed Care – PPO | Admitting: Podiatry

## 2020-09-14 ENCOUNTER — Encounter: Payer: BC Managed Care – PPO | Admitting: Obstetrics and Gynecology

## 2020-09-15 ENCOUNTER — Encounter (INDEPENDENT_AMBULATORY_CARE_PROVIDER_SITE_OTHER): Payer: Self-pay | Admitting: Family Medicine

## 2020-09-15 ENCOUNTER — Other Ambulatory Visit: Payer: Self-pay

## 2020-09-15 ENCOUNTER — Ambulatory Visit (INDEPENDENT_AMBULATORY_CARE_PROVIDER_SITE_OTHER): Payer: BC Managed Care – PPO | Admitting: Family Medicine

## 2020-09-15 VITALS — BP 141/81 | HR 62 | Temp 97.6°F | Ht 64.0 in | Wt 218.0 lb

## 2020-09-15 DIAGNOSIS — E038 Other specified hypothyroidism: Secondary | ICD-10-CM | POA: Diagnosis not present

## 2020-09-15 DIAGNOSIS — Z6837 Body mass index (BMI) 37.0-37.9, adult: Secondary | ICD-10-CM

## 2020-09-15 DIAGNOSIS — M5432 Sciatica, left side: Secondary | ICD-10-CM | POA: Diagnosis not present

## 2020-09-15 DIAGNOSIS — E1169 Type 2 diabetes mellitus with other specified complication: Secondary | ICD-10-CM

## 2020-09-15 DIAGNOSIS — E785 Hyperlipidemia, unspecified: Secondary | ICD-10-CM

## 2020-09-15 DIAGNOSIS — I2510 Atherosclerotic heart disease of native coronary artery without angina pectoris: Secondary | ICD-10-CM

## 2020-09-15 DIAGNOSIS — G8929 Other chronic pain: Secondary | ICD-10-CM

## 2020-09-15 DIAGNOSIS — Z9189 Other specified personal risk factors, not elsewhere classified: Secondary | ICD-10-CM

## 2020-09-16 NOTE — Progress Notes (Signed)
Chief Complaint:   OBESITY Natasha Lyons is here to discuss her progress with her obesity treatment plan along with follow-up of her obesity related diagnoses.   Today's visit was #: 12 Starting weight: 240 lbs Starting date: 12/01/2019 Today's weight: 218 lbs Today's date: 09/15/2020 Total lbs lost to date: 22 lbs Body mass index is 37.42 kg/m.  Total weight loss percentage to date: -9.17%  Interim History:  Natasha Lyons has been to Orthopedics and is expecting to have right foot surgery in April as well as right knee surgery in June.  She says that her left-sided sciatica is worsening.  She describes it as feeling, "like there is a knife in my back".  Current Meal Plan: the Category 3 Plan for 75-80% of the time.  Current Exercise Plan: Cardio for 20 minutes 2-3 times per week. Current Anti-Obesity Medications: Ozempic 1 mg subcutaneously weekly. Side effects: None.  Assessment/Plan:   1. Hyperlipidemia associated with type 2 diabetes mellitus ([place]) Course: Improving, but not optimized. Lipid-lowering medications: None.   Plan: Dietary changes: Increase soluble fiber, decrease simple carbohydrates, decrease saturated fat. Exercise changes: Moderate to vigorous-intensity aerobic activity 150 minutes per week or as tolerated. We will continue to monitor along with PCP/specialists as it pertains to her weight loss journey.  Lab Results  Component Value Date   CHOL 166 08/31/2020   HDL 43 08/31/2020   LDLCALC 102 (H) 08/31/2020   TRIG 113 08/31/2020   CHOLHDL 3.9 08/31/2020   Lab Results  Component Value Date   ALT 29 08/31/2020   AST 24 08/31/2020   ALKPHOS 95 08/31/2020   BILITOT 0.2 08/31/2020   2. Type 2 diabetes mellitus with other specified complication, without long-term current use of insulin (HCC) Diabetes Mellitus: Controlled. Medication: Ozempic 1 mg subcutaneously weekly. Issues reviewed: blood sugar goals, complications of diabetes mellitus, hypoglycemia prevention and  treatment, exercise, and nutrition.   Plan:  Continue Ozempic.  Will likely increase to 2 mg dose this summer when available for appetite control.  The importance of regular follow up with PCP and all other specialists as scheduled was stressed to patient today.  Lab Results  Component Value Date   HGBA1C 5.7 (H) 08/31/2020   HGBA1C 6.1 (H) 04/01/2020   HGBA1C 6.2 (H) 12/01/2019   Lab Results  Component Value Date   LDLCALC 102 (H) 08/31/2020   CREATININE 0.59 08/31/2020   3. Other specified hypothyroidism Course: At goal. Medication: Armour Thyroid 15 mg daily.   Plan: We will continue to monitor alongside Endocrinology/PCP as it relates to her weight loss journey.   Lab Results  Component Value Date   TSH 2.090 08/31/2020   4. ASCVD (arteriosclerotic cardiovascular disease) The 10-year ASCVD risk score Mikey Bussing DC Jr., et al., 2013) is: 7.7%   Values used to calculate the score:     Age: 60 years     Sex: Female     Is Non-Hispanic African American: No     Diabetic: Yes     Tobacco smoker: No     Systolic Blood Pressure: 626 mmHg     Is BP treated: Yes     HDL Cholesterol: 43 mg/dL     Total Cholesterol: 166 mg/dL  Plan:  Will discuss cardiac CT score at next visit.  5. Left sided sciatica Worsening.  Affecting strength. This issue directly impacts care plan for optimization of BMI and metabolic health as it impacts the patient's ability to make lifestyle changes.  Plan:  Will  place referral to Sports Medicine today.  - Ambulatory referral to Sports Medicine  6. Other chronic pain Uncontrolled.  Followed by Rheumatology for psoriatic arthritis.  Followed by Orthopedics for right knee and right foot pain.  Plan:  We will continue to monitor symptoms as they relate to her weight loss journey.  7. At risk for heart disease Due to Natasha Lyons's current state of health and medical condition(s), she is at a higher risk for heart disease.  This puts the patient at much greater  risk to subsequently develop cardiopulmonary conditions that can significantly affect patient's quality of life in a negative manner.    At least 8 minutes were spent on counseling Natasha Lyons about these concerns today. Counseling:  Intensive lifestyle modifications were discussed with Natasha Lyons as the most appropriate first line of treatment. We will continue to reassess these conditions on a fairly regular basis in an attempt to decrease the patient's overall morbidity and mortality.  Evidence-based interventions for health behavior change were utilized today including the discussion of self monitoring techniques, problem-solving barriers, and SMART goal setting techniques.  Specifically, regarding patient's less desirable eating habits and patterns, we employed the technique of small changes when Natasha Lyons has not been able to fully commit to her prudent nutritional plan.  8. Class 2 severe obesity with serious comorbidity and body mass index (BMI) of 37.0 to 37.9 in adult, unspecified obesity type ([place])  Course: Natasha Lyons is currently in the action stage of change. As such, her goal is to continue with weight loss efforts.   Nutrition goals: She has agreed to keeping a food journal and adhering to recommended goals of 1500 calories and 95 grams of protein.   Exercise goals: As is.  Behavioral modification strategies: increasing lean protein intake, decreasing simple carbohydrates, increasing vegetables and increasing water intake.  Natasha Lyons has agreed to follow-up with our clinic in 3 weeks. She was informed of the importance of frequent follow-up visits to maximize her success with intensive lifestyle modifications for her multiple health conditions.   Objective:   Blood pressure (!) 141/81, pulse 62, temperature 97.6 F (36.4 C), temperature source Oral, height 5\' 4"  (1.626 m), weight 218 lb (98.9 kg), SpO2 93 %. Body mass index is 37.42 kg/m.  General: Cooperative, alert, well developed, in no acute  distress. HEENT: Conjunctivae and lids unremarkable. Cardiovascular: Regular rhythm.  Lungs: Normal work of breathing. Neurologic: No focal deficits.   Lab Results  Component Value Date   CREATININE 0.59 08/31/2020   BUN 12 08/31/2020   NA 141 08/31/2020   K 4.5 08/31/2020   CL 103 08/31/2020   CO2 22 08/31/2020   Lab Results  Component Value Date   ALT 29 08/31/2020   AST 24 08/31/2020   ALKPHOS 95 08/31/2020   BILITOT 0.2 08/31/2020   Lab Results  Component Value Date   HGBA1C 5.7 (H) 08/31/2020   HGBA1C 6.1 (H) 04/01/2020   HGBA1C 6.2 (H) 12/01/2019   HGBA1C 6.4 04/20/2017   HGBA1C 6.5 01/22/2017   Lab Results  Component Value Date   INSULIN 13.9 12/01/2019   Lab Results  Component Value Date   TSH 2.090 08/31/2020   Lab Results  Component Value Date   CHOL 166 08/31/2020   HDL 43 08/31/2020   LDLCALC 102 (H) 08/31/2020   TRIG 113 08/31/2020   CHOLHDL 3.9 08/31/2020   Lab Results  Component Value Date   WBC 8.5 08/31/2020   HGB 13.5 08/31/2020   HCT 42.5 08/31/2020  MCV 82 08/31/2020   PLT 281 08/31/2020   Attestation Statements:   Reviewed by clinician on day of visit: allergies, medications, problem list, medical history, surgical history, family history, social history, and previous encounter notes.  I, Water quality scientist, CMA, am acting as transcriptionist for Briscoe Deutscher, DO  I have reviewed the above documentation for accuracy and completeness, and I agree with the above. Briscoe Deutscher, DO

## 2020-09-22 ENCOUNTER — Ambulatory Visit: Payer: BC Managed Care – PPO | Admitting: Podiatry

## 2020-09-22 ENCOUNTER — Ambulatory Visit: Payer: BC Managed Care – PPO

## 2020-09-22 ENCOUNTER — Encounter: Payer: Self-pay | Admitting: Podiatry

## 2020-09-22 ENCOUNTER — Ambulatory Visit (INDEPENDENT_AMBULATORY_CARE_PROVIDER_SITE_OTHER): Payer: BC Managed Care – PPO

## 2020-09-22 ENCOUNTER — Other Ambulatory Visit: Payer: Self-pay

## 2020-09-22 DIAGNOSIS — M79671 Pain in right foot: Secondary | ICD-10-CM

## 2020-09-22 DIAGNOSIS — M722 Plantar fascial fibromatosis: Secondary | ICD-10-CM | POA: Diagnosis not present

## 2020-09-22 DIAGNOSIS — M25879 Other specified joint disorders, unspecified ankle and foot: Secondary | ICD-10-CM

## 2020-09-22 MED ORDER — TRIAMCINOLONE ACETONIDE 10 MG/ML IJ SUSP
10.0000 mg | Freq: Once | INTRAMUSCULAR | Status: AC
  Administered 2020-09-22: 10 mg

## 2020-09-22 NOTE — Progress Notes (Signed)
Subjective:    I'm seeing this patient as a consultation for:  Dr. Juleen China. Note will be routed back to referring provider/PCP.  CC: Low back pain and L sciatica  I, Molly Weber, LAT, ATC, am serving as scribe for Dr. Lynne Leader.  HPI: Pt is a 60 y/o female presenting w/ low back pain and L-sided sciatica x 20 years. Pt locates pain to L-side low back w/ radiating numbness/tingling down bilat legs. Pt has a hx of osteo arthritis and psoriatic arthritis. Of note, pt is a care taker. Pt is also in the process of considering bilat TKR, however her back pain is of primary concern.  She is the primary caregiver for her elderly father who is suffering from Alzheimer's dementia.  Radiating pain: yes into her L LE L LE numbness/tingling: yes- down bilat legs L LE weakness: yes Aggravating factors: weather changes,  Treatments tried: massage, chiro, standing  Diagnostic testing: L-spine MRI- 04/23/16  Past medical history, Surgical history, Family history, Social history, Allergies, and medications have been entered into the medical record, reviewed.   Review of Systems: No new headache, visual changes, nausea, vomiting, diarrhea, constipation, dizziness, abdominal pain, skin rash, fevers, chills, night sweats, weight loss, swollen lymph nodes, body aches, joint swelling, muscle aches, chest pain, shortness of breath, mood changes, visual or auditory hallucinations.   Objective:    Vitals:   09/23/20 0821  BP: 132/86  Pulse: 67  SpO2: 96%   General: Well Developed, well nourished, and in no acute distress.  Neuro/Psych: Alert and oriented x3, extra-ocular muscles intact, able to move all 4 extremities, sensation grossly intact. Skin: Warm and dry, no rashes noted.  Respiratory: Not using accessory muscles, speaking in full sentences, trachea midline.  Cardiovascular: Pulses palpable, no extremity edema. Abdomen: Does not appear distended. MSK: L-spine slightly laterally flexed to the  left but otherwise normal-appearing. Decreased lumbar motion to flexion and rotation and extension. Lower extremity strength reflexes and sensation are equal and normal throughout. Negative slump test bilaterally. Antalgic gait.  Lab and Radiology Results  X-ray images L-spine obtained today personally and independently interpreted Diffuse DDD worse at L4-5 and L5-S1 with neuroforaminal stenosis at L5-S1. Await formal radiology review  EXAM: MRI LUMBAR SPINE WITHOUT CONTRAST  TECHNIQUE: Multiplanar, multisequence MR imaging of the lumbar spine was performed. No intravenous contrast was administered.  COMPARISON:  07/05/2012 lumbar MRI  FINDINGS: Segmentation:  Standard.  Alignment:  Physiologic.  Vertebrae:  No fracture, evidence of discitis, or bone lesion.  Conus medullaris: Extends to the L1 level and appears normal.  Paraspinal and other soft tissues: Negative.  Disc levels: Disc desiccation and mild disc space narrowing from L3 through S1.  L1-2: No significant disc displacement, foraminal narrowing, or canal stenosis.  L2-3: No significant disc displacement, foraminal narrowing, or canal stenosis.  L3-4: Minimal disc bulge and mild facet hypertrophy with mild narrowing of lateral recesses and neural foramen. No significant canal stenosis.  L4-5: Small disc bulge with central protrusion, moderate facet, and mild ligamentum flavum hypertrophy. Trace facet effusions. Mild bilateral foraminal narrowing and lateral recess narrowing. Mild canal stenosis.  L5-S1: Small disc bulge eccentric to the left, moderate facet hypertrophy, and mild ligamentum flavum hypertrophy. Moderate left foraminal narrowing. Mild right foraminal narrowing. No significant canal stenosis.  IMPRESSION: Stable lumbar spondylosis with discogenic and facet degenerative changes from L3 through S1. Degenerative changes are greatest at L4-5 where there is mild canal stenosis  and L5-S1 where there is moderate left-sided foraminal  narrowing.   Electronically Signed   By: Kristine Garbe M.D.   On: 04/24/2016 05:51  I, Lynne Leader, personally (independently) visualized and performed the interpretation of the images attached in this note.   Impression and Recommendations:    Assessment and Plan: 60 y.o. female with chronic low back pain worse at the left side associated with some paresthesias thought to be due to lumbar radiculopathy bilateral lower extremities.  Plan for physical therapy to address the low back pain thought to be due to muscle dysfunction.  Additionally trial of Lyrica for paresthesias especially at bedtime.  Reassess in 6 weeks.  Consider MRI as next step for potential epidural steroid and facet injection planning.Marland Kitchen  Seaira is anticipating knee replacements this summer.  Goals are to increase core strength and reduce back pain and improve mobility with physical therapy so that she is in better shape for knee replacement in the near future.  PDMP not reviewed this encounter. Orders Placed This Encounter  Procedures  . DG Lumbar Spine 2-3 Views    Standing Status:   Future    Number of Occurrences:   1    Standing Expiration Date:   09/23/2021    Order Specific Question:   Reason for Exam (SYMPTOM  OR DIAGNOSIS REQUIRED)    Answer:   eval lumbar pain    Order Specific Question:   Is patient pregnant?    Answer:   No    Order Specific Question:   Preferred imaging location?    Answer:   [facility]  . Ambulatory referral to Physical Therapy    Referral Priority:   Routine    Referral Type:   Physical Medicine    Referral Reason:   Specialty Services Required    Requested Specialty:   Physical Therapy    Number of Visits Requested:   1   Meds ordered this encounter  Medications  . pregabalin (LYRICA) 25 MG capsule    Sig: Take 1-3 capsules (25-75 mg total) by mouth at bedtime as needed (nerve pain).    Dispense:  90  capsule    Refill:  3    Discussed warning signs or symptoms. Please see discharge instructions. Patient expresses understanding.   The above documentation has been reviewed and is accurate and complete Lynne Leader, M.D.

## 2020-09-22 NOTE — Progress Notes (Signed)
Subjective:   Patient ID: Natasha Lyons, female   DOB: 60 y.o.   MRN: [id number]   HPI Patient presents stating that her heel pain has returned the left and she has had pain in her forefoot for a number of months and another physician thought she should have surgery.  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  At this point working is still focus on this conservatively as she is getting ready to have a knee replacement and ultimately it may require surgery for this right foot with removal of sesamoid bone.  Today I went ahead and I did inject the plantar fascial left 3 mg Dexasone Kenalog 5 g liken after sterile prep and did sterile prep right forefoot and did inject the sesamoidal complex 3 mg Dexasone Kenalog 5 mg Xylocaine and applied dancers pad.  I dispensed night splint discussed long-term orthotics once we get her symptoms under control and reviewed this with her  X-rays indicate there is a small separation of the tibial sesamoid right but difficult to tell how acute this is with spur formation plantar heel no indication stress fracture bilateral

## 2020-09-23 ENCOUNTER — Encounter: Payer: Self-pay | Admitting: Family Medicine

## 2020-09-23 ENCOUNTER — Ambulatory Visit (INDEPENDENT_AMBULATORY_CARE_PROVIDER_SITE_OTHER): Payer: BC Managed Care – PPO

## 2020-09-23 ENCOUNTER — Ambulatory Visit: Payer: BC Managed Care – PPO | Admitting: Family Medicine

## 2020-09-23 VITALS — BP 132/86 | HR 67 | Ht 64.0 in | Wt 222.2 lb

## 2020-09-23 DIAGNOSIS — M5441 Lumbago with sciatica, right side: Secondary | ICD-10-CM | POA: Diagnosis not present

## 2020-09-23 DIAGNOSIS — M5442 Lumbago with sciatica, left side: Secondary | ICD-10-CM

## 2020-09-23 DIAGNOSIS — G8929 Other chronic pain: Secondary | ICD-10-CM | POA: Diagnosis not present

## 2020-09-23 DIAGNOSIS — R202 Paresthesia of skin: Secondary | ICD-10-CM | POA: Diagnosis not present

## 2020-09-23 MED ORDER — PREGABALIN 25 MG PO CAPS: 25.0000 mg | ORAL_CAPSULE | Freq: Every evening | ORAL | 3 refills | Status: DC | PRN

## 2020-09-23 NOTE — Patient Instructions (Addendum)
Thank you for coming in today.  Please get an Xray today before you leave  I've referred you to Physical Therapy.  Let us know if you don't hear from them in one week.  Use Heating pad.  Use TENS unit.   Try lyrica at bedtime.   Recheck in 6 weeks or so.

## 2020-09-27 ENCOUNTER — Telehealth: Payer: Self-pay

## 2020-09-27 ENCOUNTER — Ambulatory Visit: Payer: BC Managed Care – PPO | Admitting: Podiatry

## 2020-09-27 NOTE — Telephone Encounter (Signed)
 called to inform us that Dr. Ilda Foil is on vacation this week and will not be able to look at the requested addendum until next week.

## 2020-09-27 NOTE — Progress Notes (Signed)
Radiology report shows some mild spurring and arthritis changes but otherwise looks okay.

## 2020-10-03 ENCOUNTER — Other Ambulatory Visit (INDEPENDENT_AMBULATORY_CARE_PROVIDER_SITE_OTHER): Payer: Self-pay | Admitting: Family Medicine

## 2020-10-03 DIAGNOSIS — E1169 Type 2 diabetes mellitus with other specified complication: Secondary | ICD-10-CM

## 2020-10-04 NOTE — Telephone Encounter (Signed)
Last OV with Dr Wallace 

## 2020-10-06 ENCOUNTER — Encounter: Payer: Self-pay | Admitting: Podiatry

## 2020-10-06 ENCOUNTER — Ambulatory Visit: Payer: BC Managed Care – PPO | Admitting: Podiatry

## 2020-10-06 ENCOUNTER — Other Ambulatory Visit: Payer: Self-pay

## 2020-10-06 ENCOUNTER — Ambulatory Visit (INDEPENDENT_AMBULATORY_CARE_PROVIDER_SITE_OTHER): Payer: BC Managed Care – PPO

## 2020-10-06 DIAGNOSIS — S92314G Nondisplaced fracture of first metatarsal bone, right foot, subsequent encounter for fracture with delayed healing: Secondary | ICD-10-CM

## 2020-10-06 DIAGNOSIS — M79671 Pain in right foot: Secondary | ICD-10-CM

## 2020-10-06 DIAGNOSIS — M25879 Other specified joint disorders, unspecified ankle and foot: Secondary | ICD-10-CM | POA: Diagnosis not present

## 2020-10-06 NOTE — Progress Notes (Signed)
Subjective:   Patient ID: Natasha Lyons, female   DOB: 60 y.o.   MRN: [id number]   HPI Patient presents stating that she does get some spasms in her lower leg the left heel is moderately improved and she still having a lot of pain in the right forefoot   ROS      Objective:  Physical Exam  Neurovascular status intact with exquisite discomfort in the right sesamoidal complex but truly more in the fibular sesamoidal area versus the tibial sesamoidal area that is been present for around 3 months.  Left heel mildly tender when pressed improved from previous visit     Assessment:  Still possibility for a fracture of the sesamoid right difficult to completely determine at this point with fasciitis improving left     Plan:  Did x-ray the right foot discussed findings and will get a go ahead and immobilize with air fracture walker with conservative consideration for MRI if symptoms persist to try to see whether or not there may be a fracture of the sesamoid.  For the left we will utilize continued stretching exercises supportive shoes and we may end up making her an orthotic long-term.  Patient will be seen back to recheck  X-rays indicate that the area of max pain is directly on the fibular sesamoid with a great difficulty determining whether or not there may be a fracture

## 2020-10-07 ENCOUNTER — Other Ambulatory Visit: Payer: Self-pay

## 2020-10-07 ENCOUNTER — Encounter (INDEPENDENT_AMBULATORY_CARE_PROVIDER_SITE_OTHER): Payer: Self-pay | Admitting: Family Medicine

## 2020-10-07 ENCOUNTER — Ambulatory Visit (INDEPENDENT_AMBULATORY_CARE_PROVIDER_SITE_OTHER): Payer: BC Managed Care – PPO | Admitting: Family Medicine

## 2020-10-07 VITALS — BP 133/83 | HR 65 | Temp 97.9°F | Ht 64.0 in | Wt 219.0 lb

## 2020-10-07 DIAGNOSIS — E1169 Type 2 diabetes mellitus with other specified complication: Secondary | ICD-10-CM

## 2020-10-07 DIAGNOSIS — Z9189 Other specified personal risk factors, not elsewhere classified: Secondary | ICD-10-CM | POA: Diagnosis not present

## 2020-10-07 DIAGNOSIS — M158 Other polyosteoarthritis: Secondary | ICD-10-CM | POA: Diagnosis not present

## 2020-10-07 DIAGNOSIS — R632 Polyphagia: Secondary | ICD-10-CM | POA: Diagnosis not present

## 2020-10-07 DIAGNOSIS — Z6841 Body Mass Index (BMI) 40.0 and over, adult: Secondary | ICD-10-CM

## 2020-10-07 MED ORDER — PHENTERMINE HCL 8 MG PO TABS
8.0000 mg | ORAL_TABLET | Freq: Two times a day (BID) | ORAL | 0 refills | Status: DC
Start: 2020-10-07 — End: 2020-12-20

## 2020-10-07 MED ORDER — OZEMPIC (1 MG/DOSE) 4 MG/3ML ~~LOC~~ SOPN: 1.0000 mg | PEN_INJECTOR | SUBCUTANEOUS | 0 refills | Status: DC

## 2020-10-07 NOTE — Progress Notes (Signed)
Chief Complaint:   OBESITY Natasha Lyons is here to discuss her progress with her obesity treatment plan along with follow-up of her obesity related diagnoses.   Today's visit was #: 13 Starting weight: 240 lbs Starting date: 12/01/2019 Today's weight: 219 lbs Today's date: 10/07/2020 Total lbs lost to date: 21 lbs Body mass index is 37.59 kg/m.  Total weight loss percentage to date: -8.75%  Interim History:  Natasha Lyons says that her insurance should cover an AOM (covered Qsymia in the past).  She endorses increased pain and stress.  Current Meal Plan: the Category 3 Plan for 50-60% of the time.  Current Exercise Plan: None.  Assessment/Plan:   1. Type 2 diabetes mellitus with other specified complication, without long-term current use of insulin (HCC) Diabetes Mellitus: At goal. Medication: None. Issues reviewed: blood sugar goals, complications of diabetes mellitus, hypoglycemia prevention and treatment, exercise, and nutrition.   Plan: Well controlled.  No signs of complications, medication side effects, or red flags.  Continue current regimen.    Lab Results  Component Value Date   HGBA1C 5.7 (H) 08/31/2020   HGBA1C 6.1 (H) 04/01/2020   HGBA1C 6.2 (H) 12/01/2019   Lab Results  Component Value Date   LDLCALC 102 (H) 08/31/2020   CREATININE 0.59 08/31/2020   - Semaglutide, 1 MG/DOSE, (OZEMPIC, 1 MG/DOSE,) 4 MG/3ML SOPN; Inject 1 mg into the skin once a week.  Dispense: 3 mL; Refill: 0  2. Polyphagia Uncontrolled.  Current treatment: None. Polyphagia refers to excessive feelings of hunger. She will continue to focus on protein-rich, low simple carbohydrate foods. We reviewed the importance of hydration, regular exercise for stress reduction, and restorative sleep.  Plan:  Start phentermine 8 mg twice daily, as per below.  - Start Phentermine HCl 8 MG TABS; Take 8 mg by mouth 2 (two) times daily with breakfast and lunch.   This patient 1) has no evidence of serious  cardiovascular disease; 2) does not have serious psychiatric disease or a history of substance abuse. We reviewed potential side effects including insomnia, dry mouth, increased heart rate and blood pressure, and increased anxiety. We reviewed reducing caffeine consumption while taking phentermine, especially if the patient is experiencing side effects. Alternative treatment options were discussed. All questions were answered, and the patient wishes to move forward with this medication.  I have consulted the [place] Controlled Substances Registry for this patient, and feel the risk/benefit ratio today is favorable for proceeding with this prescription for a controlled substance. The patient understands monitoring parameters and red flags.   3. Other osteoarthritis involving multiple joints We will continue to monitor symptoms as they relate to her weight loss journey.  4. At risk for heart disease Due to Natasha Lyons's current state of health and medical condition(s), she is at a higher risk for heart disease.  This puts the patient at much greater risk to subsequently develop cardiopulmonary conditions that can significantly affect patient's quality of life in a negative manner.    At least 9 minutes were spent on counseling Natasha Lyons about these concerns today. Evidence-based interventions for health behavior change were utilized today including the discussion of self monitoring techniques, problem-solving barriers, and SMART goal setting techniques.  Specifically, regarding patient's less desirable eating habits and patterns, we employed the technique of small changes when Natasha Lyons has not been able to fully commit to her prudent nutritional plan.  5. Obesity, current BMI 37.7  Course: Natasha Lyons is currently in the action stage of change. As such,  her goal is to continue with weight loss efforts.   Nutrition goals: She has agreed to the Category 3 Plan.   Exercise goals: As tolerated.  Behavioral modification  strategies: increasing lean protein intake, decreasing simple carbohydrates, increasing vegetables, increasing water intake and emotional eating strategies.  Natasha Lyons has agreed to follow-up with our clinic in 4 weeks. She was informed of the importance of frequent follow-up visits to maximize her success with intensive lifestyle modifications for her multiple health conditions.   Objective:   Blood pressure 133/83, pulse 65, temperature 97.9 F (36.6 C), temperature source Oral, height 5\' 4"  (1.626 m), weight 219 lb (99.3 kg), SpO2 98 %. Body mass index is 37.59 kg/m.  General: Cooperative, alert, well developed, in no acute distress. HEENT: Conjunctivae and lids unremarkable. Cardiovascular: Regular rhythm.  Lungs: Normal work of breathing. Neurologic: No focal deficits.   Lab Results  Component Value Date   CREATININE 0.59 08/31/2020   BUN 12 08/31/2020   NA 141 08/31/2020   K 4.5 08/31/2020   CL 103 08/31/2020   CO2 22 08/31/2020   Lab Results  Component Value Date   ALT 29 08/31/2020   AST 24 08/31/2020   ALKPHOS 95 08/31/2020   BILITOT 0.2 08/31/2020   Lab Results  Component Value Date   HGBA1C 5.7 (H) 08/31/2020   HGBA1C 6.1 (H) 04/01/2020   HGBA1C 6.2 (H) 12/01/2019   HGBA1C 6.4 04/20/2017   HGBA1C 6.5 01/22/2017   Lab Results  Component Value Date   INSULIN 13.9 12/01/2019   Lab Results  Component Value Date   TSH 2.090 08/31/2020   Lab Results  Component Value Date   CHOL 166 08/31/2020   HDL 43 08/31/2020   LDLCALC 102 (H) 08/31/2020   TRIG 113 08/31/2020   CHOLHDL 3.9 08/31/2020   Lab Results  Component Value Date   WBC 8.5 08/31/2020   HGB 13.5 08/31/2020   HCT 42.5 08/31/2020   MCV 82 08/31/2020   PLT 281 08/31/2020   Attestation Statements:   Reviewed by clinician on day of visit: allergies, medications, problem list, medical history, surgical history, family history, social history, and previous encounter notes.  I, Water quality scientist, CMA,  am acting as transcriptionist for Briscoe Deutscher, DO  I have reviewed the above documentation for accuracy and completeness, and I agree with the above. Briscoe Deutscher, DO

## 2020-11-03 ENCOUNTER — Ambulatory Visit: Payer: BC Managed Care – PPO | Admitting: Podiatry

## 2020-11-03 ENCOUNTER — Encounter: Payer: Self-pay | Admitting: Podiatry

## 2020-11-03 ENCOUNTER — Ambulatory Visit (INDEPENDENT_AMBULATORY_CARE_PROVIDER_SITE_OTHER): Payer: BC Managed Care – PPO

## 2020-11-03 ENCOUNTER — Other Ambulatory Visit: Payer: Self-pay

## 2020-11-03 DIAGNOSIS — M25879 Other specified joint disorders, unspecified ankle and foot: Secondary | ICD-10-CM

## 2020-11-03 DIAGNOSIS — M7751 Other enthesopathy of right foot: Secondary | ICD-10-CM | POA: Diagnosis not present

## 2020-11-03 DIAGNOSIS — M79671 Pain in right foot: Secondary | ICD-10-CM

## 2020-11-03 DIAGNOSIS — M25871 Other specified joint disorders, right ankle and foot: Secondary | ICD-10-CM | POA: Diagnosis not present

## 2020-11-03 DIAGNOSIS — M779 Enthesopathy, unspecified: Secondary | ICD-10-CM

## 2020-11-03 MED ORDER — TRIAMCINOLONE ACETONIDE 10 MG/ML IJ SUSP
10.0000 mg | Freq: Once | INTRAMUSCULAR | Status: AC
  Administered 2020-11-03: 10 mg

## 2020-11-03 NOTE — Progress Notes (Signed)
Subjective:   Patient ID: Natasha Lyons, female   DOB: 60 y.o.   MRN: [id number]   HPI Patient presents stating she is making progress but still having pain in the joint and moderate discomfort underneath.  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  I do think the fibular sesamoid is not fractured may be inflamed we will continue to use padding rigid bottom shoes and boot as needed.  Patient will be seen back may require orthotics and she is due for knee replacement and I will see her after that to construct an orthotic device which may be beneficial  X-rays were negative for signs of bony injury or indications of a crack of the sesamoid bone

## 2020-11-04 ENCOUNTER — Ambulatory Visit: Payer: BC Managed Care – PPO | Admitting: Family Medicine

## 2020-11-12 ENCOUNTER — Ambulatory Visit: Payer: BC Managed Care – PPO | Admitting: Family Medicine

## 2020-11-17 ENCOUNTER — Other Ambulatory Visit (INDEPENDENT_AMBULATORY_CARE_PROVIDER_SITE_OTHER): Payer: Self-pay | Admitting: Family Medicine

## 2020-11-17 ENCOUNTER — Other Ambulatory Visit: Payer: Self-pay

## 2020-11-17 ENCOUNTER — Encounter (INDEPENDENT_AMBULATORY_CARE_PROVIDER_SITE_OTHER): Payer: Self-pay | Admitting: Family Medicine

## 2020-11-17 ENCOUNTER — Ambulatory Visit (INDEPENDENT_AMBULATORY_CARE_PROVIDER_SITE_OTHER): Payer: BC Managed Care – PPO | Admitting: Family Medicine

## 2020-11-17 VITALS — BP 131/78 | HR 61 | Temp 97.9°F | Ht 64.0 in | Wt 215.0 lb

## 2020-11-17 DIAGNOSIS — E039 Hypothyroidism, unspecified: Secondary | ICD-10-CM | POA: Diagnosis not present

## 2020-11-17 DIAGNOSIS — M25561 Pain in right knee: Secondary | ICD-10-CM | POA: Diagnosis not present

## 2020-11-17 DIAGNOSIS — E1169 Type 2 diabetes mellitus with other specified complication: Secondary | ICD-10-CM | POA: Diagnosis not present

## 2020-11-17 DIAGNOSIS — L4050 Arthropathic psoriasis, unspecified: Secondary | ICD-10-CM

## 2020-11-17 DIAGNOSIS — E66813 Obesity, class 3: Secondary | ICD-10-CM

## 2020-11-17 DIAGNOSIS — Z9189 Other specified personal risk factors, not elsewhere classified: Secondary | ICD-10-CM | POA: Diagnosis not present

## 2020-11-17 DIAGNOSIS — Z6841 Body Mass Index (BMI) 40.0 and over, adult: Secondary | ICD-10-CM

## 2020-11-17 DIAGNOSIS — G8929 Other chronic pain: Secondary | ICD-10-CM

## 2020-11-17 MED ORDER — OZEMPIC (2 MG/DOSE) 8 MG/3ML ~~LOC~~ SOPN
2.0000 mg | PEN_INJECTOR | SUBCUTANEOUS | 1 refills | Status: DC
Start: 2020-11-17 — End: 2020-11-24

## 2020-11-17 NOTE — Telephone Encounter (Signed)
Will do PA

## 2020-11-18 ENCOUNTER — Encounter (INDEPENDENT_AMBULATORY_CARE_PROVIDER_SITE_OTHER): Payer: Self-pay

## 2020-11-18 NOTE — Progress Notes (Signed)
I, Wendy Poet, LAT, ATC, am serving as scribe for Dr. Lynne Leader.  Natasha Lyons is a 60 y.o. female who presents to [place] at [facility] today for f/u of chronic LBP and L-sided sciatica x 20 years.  She was last seen by Dr. Georgina Snell on 09/23/20 and was referred to PT at [facility] and has completed 8 sessions.  She was also prescribed Lyrica.  Pt is planning on having B TKR this summer so her goal is to get her back pain under control to allow her to focus on her knees.  Since her last visit, pt reports she really enjoyed the PT visits. Pt reports 70-80% improvement, but will experience pain if not doing HEP. Pt notes that she has been exercising more and has a new dog that she's been walking.  She has a consultation with Dr. Berenice Primas at [facility] today to discuss total knee replacements planned for later this summer.  Diagnostic testing: L-spine XR- 09/23/20; L-spine MRI-04/23/16  Pertinent review of systems: No fevers or chills  Relevant historical information: Hypertension, diabetes   Exam:  BP (!) 143/88 (BP Location: Right Arm, Patient Position: Sitting, Cuff Size: Normal)   Pulse 63   Ht 5\' 4"  (1.626 m)   Wt 218 lb 3.2 oz (99 kg)   SpO2 99%   BMI 37.45 kg/m  General: Well Developed, well nourished, and in no acute distress.   MSK: Bilateral knees normal motion normal gait.    Lab and Radiology Results  EXAM: MRI OF THE RIGHT KNEE WITHOUT CONTRAST  TECHNIQUE: Multiplanar, multisequence MR imaging of the knee was performed. No intravenous contrast was administered.  COMPARISON:  Right tibia and fibula x-rays dated March 22, 2017.  FINDINGS: MENISCI  Medial meniscus: Complete radial tear of the posterior root. Extrusion and complex tear of the body.  Lateral meniscus:  Intact.  LIGAMENTS  Cruciates:  Intact ACL and PCL.  Collaterals: Medial collateral ligament is intact. Lateral collateral ligament complex  is intact.  CARTILAGE  Patellofemoral: Full-thickness cartilage loss over the patellar apex and lateral facet. Full-thickness cartilage loss over the trochlear groove and lateral trochlea.  Medial: Large areas of full-thickness cartilage loss over the weight-bearing medial femoral condyle and medial tibial plateau  Lateral: Full-thickness fissuring and delamination over the central weight-bearing lateral tibial plateau.  Joint:  Small joint effusion.  Normal Hoffa's fat.  Popliteal Fossa:  No Baker cyst. Intact popliteus tendon.  Extensor Mechanism: Intact quadriceps tendon and patellar tendon. Intact medial and lateral patellar retinaculum. Intact MPFL.  Bones: No acute fracture or dislocation. No suspicious bone lesion.  Other: None.  IMPRESSION: 1. Complete radial tear of the medial meniscus posterior root. Extrusion and complex tear of the body. 2. Tricompartmental osteoarthritis, moderate in the medial and patellofemoral compartments.   Electronically Signed   By: Titus Dubin M.D.   On: 02/17/2020 09:22  ADDENDUM REPORT: 10/04/2020 09:59  ADDENDUM: Voice recognition correction:  IMPRESSION: 2.  Minimal disc osteophytic disease.   Electronically Signed   By: Suzy Bouchard M.D.   On: 10/04/2020 09:59   Addended by Gus Height, MD on 10/04/2020 10:01 AM    Study Result  Narrative & Impression  CLINICAL DATA:  Chronic low back pain.  EXAM: LUMBAR SPINE - 2-3 VIEW  COMPARISON:  None.  FINDINGS: Normal alignment of lumbar vertebral bodies. No loss of vertebral body height or disc height. No pars fracture. Minimal endplate spurring. No subluxation.  IMPRESSION: 1. No  acute findings lumbar spine. 2. Minimal  Electronically Signed: By: Suzy Bouchard M.D. On: 09/24/2020 10:15      I, Lynne Leader, personally (independently) visualized and performed the interpretation of the images attached in this  note.      Assessment and Plan: 60 y.o. female with bilateral knee pain and sciatica.  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  Patient has done well recently with physical therapy however ultimately I think it is likely that she will require total knee replacements.  She has a consultation today with Dr. Berenice Primas which I think is a good idea.  The work that she is put in recently for physical therapy should be helpful at making recovery from total knee replacements go much more smoothly at the very least.  Additionally her sciatica pain has been much reduced from her baseline by physical therapy.  Again this will be helpful in reducing her sciatica pain and while recovering from total knee replacement.  Plan for consultation with Dr. Berenice Primas and recheck back with myself as needed.  CC Dr. Berenice Primas    Discussed warning signs or symptoms. Please see discharge instructions. Patient expresses understanding.   The above documentation has been reviewed and is accurate and complete Lynne Leader, M.D.  Total encounter time 20 minutes including face-to-face time with the patient and, reviewing past medical record, and charting on the date of service.   Discussed treatment plan and options.

## 2020-11-19 ENCOUNTER — Other Ambulatory Visit: Payer: Self-pay

## 2020-11-19 ENCOUNTER — Ambulatory Visit: Payer: BC Managed Care – PPO | Admitting: Family Medicine

## 2020-11-19 ENCOUNTER — Encounter: Payer: BC Managed Care – PPO | Admitting: Obstetrics and Gynecology

## 2020-11-19 VITALS — BP 143/88 | HR 63 | Ht 64.0 in | Wt 218.2 lb

## 2020-11-19 DIAGNOSIS — M5442 Lumbago with sciatica, left side: Secondary | ICD-10-CM | POA: Diagnosis not present

## 2020-11-19 DIAGNOSIS — M25562 Pain in left knee: Secondary | ICD-10-CM

## 2020-11-19 DIAGNOSIS — M25561 Pain in right knee: Secondary | ICD-10-CM | POA: Diagnosis not present

## 2020-11-19 DIAGNOSIS — Z0289 Encounter for other administrative examinations: Secondary | ICD-10-CM

## 2020-11-19 DIAGNOSIS — M5441 Lumbago with sciatica, right side: Secondary | ICD-10-CM

## 2020-11-19 DIAGNOSIS — G8929 Other chronic pain: Secondary | ICD-10-CM

## 2020-11-19 NOTE — Patient Instructions (Signed)
Thank you for coming in today.  Continue home exercises.   It think total knee replacment is a good long term solution.   Recheck with me as needed.   Let Dr Berenice Primas know you saw me so the notes are in Epic. He should be able to see them.  I will route notes to him.   Best Knee Sleeve I recommend you obtained a compression sleeve to help with your joint problems. There are many options on the market however I recommend obtaining a Full knee Body Helix compression sleeve.  You can find information (including how to appropriate measure yourself for sizing) can be found at www.Body http://www.lambert.com/.  Many of these products are health savings account (HSA) eligible.   You can use the compression sleeve at any time throughout the day but is most important to use while being active as well as for 2 hours post-activity.   It is appropriate to ice following activity with the compression sleeve in place.

## 2020-11-22 ENCOUNTER — Other Ambulatory Visit (INDEPENDENT_AMBULATORY_CARE_PROVIDER_SITE_OTHER): Payer: Self-pay | Admitting: Family Medicine

## 2020-11-22 DIAGNOSIS — E1169 Type 2 diabetes mellitus with other specified complication: Secondary | ICD-10-CM

## 2020-11-22 NOTE — Telephone Encounter (Signed)
DR Wallace 

## 2020-11-24 NOTE — Progress Notes (Signed)
Chief Complaint:   OBESITY Natasha Lyons is here to discuss her progress with her obesity treatment plan along with follow-up of her obesity related diagnoses.   Today's visit was #: 14 Starting weight: 240 lbs Starting date: 12/01/2019 Today's weight: 215 lbs Today's date: 11/17/2020 Weight change since last visit: 4 lbs Total lbs lost to date: 25 lbs Body mass index is 36.9 kg/m.  Total weight loss percentage to date: -10.42%  Interim History:  Natasha Lyons says she has a new dog and is walking more.  She has been in PT and says it is helping.  She has been seeing Dr. Berenice Primas in Orthopedics this Friday. Current Meal Plan: the Category 3 Plan for 85-90% of the time.  Current Exercise Plan: Walking 1 mile 7 times per week. Current Anti-Obesity Medications: Ozempic 1 mg subcutaneously weekly and phentermine 8 mg daily. Side effects: None.  Assessment/Plan:   Meds ordered this encounter  Medications  . Semaglutide, 2 MG/DOSE, (OZEMPIC, 2 MG/DOSE,) 8 MG/3ML SOPN    Sig: Inject 2 mg into the skin once a week.    Dispense:  3 mL    Refill:  1   1. Type 2 diabetes mellitus with other specified complication, without long-term current use of insulin (HCC) Diabetes Mellitus: At goal. Medication: Ozempic 1 mg subcutaneously weekly. Issues reviewed: blood sugar goals, complications of diabetes mellitus, hypoglycemia prevention and treatment, exercise, and nutrition.   Plan:  Increase Ozempic to 2 mg subcutaneously weekly.  Stop phentermine.  The patient will continue to focus on protein-rich, low simple carbohydrate foods. We reviewed the importance of hydration, regular exercise for stress reduction, and restorative sleep.   Lab Results  Component Value Date   HGBA1C 5.7 (H) 08/31/2020   HGBA1C 6.1 (H) 04/01/2020   HGBA1C 6.2 (H) 12/01/2019   Lab Results  Component Value Date   LDLCALC 102 (H) 08/31/2020   CREATININE 0.59 08/31/2020   - Increase Semaglutide, 2 MG/DOSE, (OZEMPIC, 2  MG/DOSE,) 8 MG/3ML SOPN; Inject 2 mg into the skin once a week.  Dispense: 3 mL; Refill: 1  2. Chronic pain of right knee We will continue to monitor symptoms as they relate toherweight loss journey.  3. Psoriatic arthritis ([place]) Natasha Lyons is taking methotrexate 20 mg every Sunday.  She is followed by Rheumatology.  4. Acquired hypothyroidism Course: Controlled. Medication: [place] Thyroid 15 mg daily.   Plan: We will continue to monitor alongside Endocrinology/PCP as it relates to her weight loss journey.   Lab Results  Component Value Date   TSH 2.090 08/31/2020   5. At risk for activity intolerance Natasha Lyons was given approximately 8 minutes of counseling today regarding her increased risk for exercise intolerance.  We discussed patient's specific personal and medical issues that raise our concern.  She was advised of strategies to prevent injury and ways to improve her cardiopulmonary fitness levels slowly over time.    6. Obesity, current BMI 36.9  Course: Damyah is currently in the action stage of change. As such, her goal is to continue with weight loss efforts.   Nutrition goals: She has agreed to the Category 3 Plan.   Exercise goals: As is.  Behavioral modification strategies: increasing lean protein intake, decreasing simple carbohydrates, increasing vegetables and increasing water intake.  Natasha Lyons has agreed to follow-up with our clinic in 4 weeks. She was informed of the importance of frequent follow-up visits to maximize her success with intensive lifestyle modifications for her multiple health conditions.   Objective:  Blood pressure 131/78, pulse 61, temperature 97.9 F (36.6 C), temperature source Oral, height 5\' 4"  (1.626 m), weight 215 lb (97.5 kg), SpO2 95 %. Body mass index is 36.9 kg/m.  General: Cooperative, alert, well developed, in no acute distress. HEENT: Conjunctivae and lids unremarkable. Cardiovascular: Regular rhythm.  Lungs: Normal work of  breathing. Neurologic: No focal deficits.   Lab Results  Component Value Date   CREATININE 0.59 08/31/2020   BUN 12 08/31/2020   NA 141 08/31/2020   K 4.5 08/31/2020   CL 103 08/31/2020   CO2 22 08/31/2020   Lab Results  Component Value Date   ALT 29 08/31/2020   AST 24 08/31/2020   ALKPHOS 95 08/31/2020   BILITOT 0.2 08/31/2020   Lab Results  Component Value Date   HGBA1C 5.7 (H) 08/31/2020   HGBA1C 6.1 (H) 04/01/2020   HGBA1C 6.2 (H) 12/01/2019   HGBA1C 6.4 04/20/2017   HGBA1C 6.5 01/22/2017   Lab Results  Component Value Date   INSULIN 13.9 12/01/2019   Lab Results  Component Value Date   TSH 2.090 08/31/2020   Lab Results  Component Value Date   CHOL 166 08/31/2020   HDL 43 08/31/2020   LDLCALC 102 (H) 08/31/2020   TRIG 113 08/31/2020   CHOLHDL 3.9 08/31/2020   Lab Results  Component Value Date   WBC 8.5 08/31/2020   HGB 13.5 08/31/2020   HCT 42.5 08/31/2020   MCV 82 08/31/2020   PLT 281 08/31/2020   Attestation Statements:   Reviewed by clinician on day of visit: allergies, medications, problem list, medical history, surgical history, family history, social history, and previous encounter notes.  I, Water quality scientist, CMA, am acting as transcriptionist for Briscoe Deutscher, DO  I have reviewed the above documentation for accuracy and completeness, and I agree with the above. Briscoe Deutscher, DO

## 2020-12-15 ENCOUNTER — Ambulatory Visit (INDEPENDENT_AMBULATORY_CARE_PROVIDER_SITE_OTHER): Payer: BC Managed Care – PPO | Admitting: Family Medicine

## 2020-12-15 ENCOUNTER — Other Ambulatory Visit: Payer: Self-pay

## 2020-12-15 ENCOUNTER — Encounter (INDEPENDENT_AMBULATORY_CARE_PROVIDER_SITE_OTHER): Payer: Self-pay | Admitting: Family Medicine

## 2020-12-15 VITALS — BP 134/83 | HR 71 | Temp 97.8°F | Ht 64.0 in | Wt 213.0 lb

## 2020-12-15 DIAGNOSIS — G8929 Other chronic pain: Secondary | ICD-10-CM

## 2020-12-15 DIAGNOSIS — E1169 Type 2 diabetes mellitus with other specified complication: Secondary | ICD-10-CM | POA: Diagnosis not present

## 2020-12-15 DIAGNOSIS — L4050 Arthropathic psoriasis, unspecified: Secondary | ICD-10-CM

## 2020-12-15 DIAGNOSIS — M25561 Pain in right knee: Secondary | ICD-10-CM

## 2020-12-15 DIAGNOSIS — Z9189 Other specified personal risk factors, not elsewhere classified: Secondary | ICD-10-CM | POA: Diagnosis not present

## 2020-12-15 DIAGNOSIS — Z6841 Body Mass Index (BMI) 40.0 and over, adult: Secondary | ICD-10-CM

## 2020-12-15 NOTE — Progress Notes (Signed)
Chief Complaint:   OBESITY Natasha Lyons is here to discuss her progress with her obesity treatment plan along with follow-up of her obesity related diagnoses. See Medical Weight Management Flowsheet for bioelectrical impedance results.  Today's visit was #: 15 Starting weight: 240 lbs Starting date: 12/01/2019 Today's weight: 213 lbs Today's date: 12/15/2020 Weight change since last visit: 2 lbs Total lbs lost to date: 27 lbs Body mass index is 36.56 kg/m.  Total weight loss percentage to date: -11.25%  Interim History: Natasha Lyons had COVID on 5/16.  She worked throughout her illness from home. She endorses continued rhinitis. She has been out of Ozempic for 2 weeks (medication not available).  She is scheduled for knee surgery on her right knee on June 29.  She has an appointment with Rheumatology today.  Nutrition Plan: the Category 3 Plan for 70-80% of the time. Activity:  Walking/strength training for 30 minutes 7 times per week. Anti-obesity medications: Ozempic 1 mg subcutaneously weekly and phentermine 8 mg twice daily. Reported side effects: None.  Assessment/Plan:   1. Chronic pain of right knee Natasha Lyons is having surgery on her right knee on June 29.  We will continue to monitor symptoms as they relate to her weight loss journey.  2. Psoriatic arthritis (Natasha Lyons) Natasha Lyons is taking methotrexate 20 mg every Sunday and Cosentyx monthly.  She has an appointment with Rheumatology today.  3. Type 2 diabetes mellitus with other specified complication, without long-term current use of insulin (HCC) Diabetes Mellitus: At goal. Medication: Ozempic 1 mg subcutaneously weekly (has been out for 2 weeks now due to availability issues). Issues reviewed: blood sugar goals, complications of diabetes mellitus, hypoglycemia prevention and treatment, exercise, and nutrition.   Plan: The patient will continue to focus on protein-rich, low simple carbohydrate foods. We reviewed the importance of hydration,  regular exercise for stress reduction, and restorative sleep.   Lab Results  Component Value Date   HGBA1C 5.7 (H) 08/31/2020   HGBA1C 6.1 (H) 04/01/2020   HGBA1C 6.2 (H) 12/01/2019   Lab Results  Component Value Date   LDLCALC 102 (H) 08/31/2020   CREATININE 0.59 08/31/2020   4. At risk for impaired metabolic function Due to Natasha Lyons's current state of health and medical condition(s), she is at a significantly higher risk for impaired metabolic function.  At least 8 minutes was spent on counseling Natasha Lyons about these concerns today.  This places the patient at a much greater risk to subsequently develop cardio-pulmonary conditions that can negatively affect the patient's quality of life.  I stressed the importance of reversing these risks factors.    5. Obesity, current BMI 36.6  Course: Natasha Lyons is currently in the action stage of change. As such, her goal is to continue with weight loss efforts.   Nutrition goals: She has agreed to the Category 3 Plan.   Exercise goals:  As is.  Behavioral modification strategies: increasing lean protein intake, decreasing simple carbohydrates, increasing vegetables, increasing water intake, and meal planning and cooking strategies.  Natasha Lyons has agreed to follow-up with our clinic in 5-6 weeks. She was informed of the importance of frequent follow-up visits to maximize her success with intensive lifestyle modifications for her multiple health conditions.   Objective:   Blood pressure 134/83, pulse 71, temperature 97.8 F (36.6 C), temperature source Oral, height 5\' 4"  (1.626 m), weight 213 lb (96.6 kg), SpO2 95 %. Body mass index is 36.56 kg/m.  General: Cooperative, alert, well developed, in no acute distress. HEENT:  Conjunctivae and lids unremarkable. Cardiovascular: Regular rhythm.  Lungs: Normal work of breathing. Neurologic: No focal deficits.   Lab Results  Component Value Date   CREATININE 0.59 08/31/2020   BUN 12 08/31/2020   NA 141  08/31/2020   K 4.5 08/31/2020   CL 103 08/31/2020   CO2 22 08/31/2020   Lab Results  Component Value Date   ALT 29 08/31/2020   AST 24 08/31/2020   ALKPHOS 95 08/31/2020   BILITOT 0.2 08/31/2020   Lab Results  Component Value Date   HGBA1C 5.7 (H) 08/31/2020   HGBA1C 6.1 (H) 04/01/2020   HGBA1C 6.2 (H) 12/01/2019   HGBA1C 6.4 04/20/2017   HGBA1C 6.5 01/22/2017   Lab Results  Component Value Date   INSULIN 13.9 12/01/2019   Lab Results  Component Value Date   TSH 2.090 08/31/2020   Lab Results  Component Value Date   CHOL 166 08/31/2020   HDL 43 08/31/2020   LDLCALC 102 (H) 08/31/2020   TRIG 113 08/31/2020   CHOLHDL 3.9 08/31/2020   Lab Results  Component Value Date   WBC 8.5 08/31/2020   HGB 13.5 08/31/2020   HCT 42.5 08/31/2020   MCV 82 08/31/2020   PLT 281 08/31/2020   Attestation Statements:   Reviewed by clinician on day of visit: allergies, medications, problem list, medical history, surgical history, family history, social history, and previous encounter notes.  I, Water quality scientist, CMA, am acting as transcriptionist for Briscoe Deutscher, DO  I have reviewed the above documentation for accuracy and completeness, and I agree with the above. Briscoe Deutscher, DO

## 2020-12-20 ENCOUNTER — Encounter: Payer: Self-pay | Admitting: Cardiology

## 2020-12-20 ENCOUNTER — Other Ambulatory Visit: Payer: Self-pay

## 2020-12-20 ENCOUNTER — Ambulatory Visit: Payer: BC Managed Care – PPO | Admitting: Cardiology

## 2020-12-20 VITALS — BP 137/81 | HR 74 | Temp 98.3°F | Resp 16 | Ht 64.0 in | Wt 217.0 lb

## 2020-12-20 DIAGNOSIS — E119 Type 2 diabetes mellitus without complications: Secondary | ICD-10-CM

## 2020-12-20 DIAGNOSIS — Z8616 Personal history of COVID-19: Secondary | ICD-10-CM

## 2020-12-20 DIAGNOSIS — E7800 Pure hypercholesterolemia, unspecified: Secondary | ICD-10-CM

## 2020-12-20 DIAGNOSIS — R9431 Abnormal electrocardiogram [ECG] [EKG]: Secondary | ICD-10-CM

## 2020-12-20 DIAGNOSIS — K219 Gastro-esophageal reflux disease without esophagitis: Secondary | ICD-10-CM

## 2020-12-20 DIAGNOSIS — I10 Essential (primary) hypertension: Secondary | ICD-10-CM

## 2020-12-20 DIAGNOSIS — Z01810 Encounter for preprocedural cardiovascular examination: Secondary | ICD-10-CM

## 2020-12-20 DIAGNOSIS — Z6837 Body mass index (BMI) 37.0-37.9, adult: Secondary | ICD-10-CM

## 2020-12-20 NOTE — Progress Notes (Signed)
Date:  12/20/2020   ID:  Natasha Lyons, DOB Nov 04, 1960, MRN [id number]  PCP:  Leeroy Cha, MD  Cardiologist:  Rex Kras, DO, [facility] (established care 12/20/2020) Former Cardiology Providers: None  REASON FOR CONSULT: Preoperative risk stratification, abnormal EKG, cardiovascular risk factors  REQUESTING PHYSICIAN:  Leeroy Cha, MD [address] [place],  [place] [address]  Chief Complaint  Patient presents with   Abnormal ECG   Hypertension   New Patient (Initial Visit)    HPI  Natasha Lyons is a 60 y.o. female who presents to the office with a chief complaint of " preop clearance." Patient's past medical history and cardiovascular risk factors include: Hypertension, GERD, obesity due to excess calories, asthma, hypothyroidism non-insulin-dependent diabetes mellitus type 2, hypercholesterolemia, vitamin D deficiency,, history of COVID-19 infection.  She is referred to the office at the request of Leeroy Cha,* for evaluation of preoperative risk stratification, abnormal EKG, cardiovascular risk factors.  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 Berenice Primas from [facility] orthopedic group.  She had a EKG done as a part of the preoperative evaluation which was interpreted to be abnormal and now referred to cardiology for further evaluation and management.  Patient states that she is unable to comment on having chest pain given her recent COVID infection, underlying GERD, and psoriatic arthritis.  She denies any prior history of myocardial infarction/PCI/CABG.  She denies any prior history of stroke or TIA.  And no known history of congestive heart failure or significant valvular heart disease.  Patient is a non-insulin-dependent diabetes mellitus type 2.  And serum creatinine is less than 2 mg/dL.  Patient states that her overall functional capacity is limited due to osteoarthritis of the knee but tries to walk her  dog daily.  ALLERGIES: Allergies  Allergen Reactions   Dust Mite Extract Shortness Of Breath   Grass Extracts [Gramineae Pollens] Shortness Of Breath   Tree Extract Shortness Of Breath    Certain tree allergies    MEDICATION LIST PRIOR TO VISIT: Current Meds  Medication Sig   Acetaminophen (TYLENOL 8 HOUR PO) Take by mouth.   albuterol (PROVENTIL) (2.5 MG/3ML) 0.083% nebulizer solution Take 2.5 mg by nebulization every 6 (six) hours as needed for wheezing or shortness of breath.   albuterol (VENTOLIN HFA) 108 (90 Base) MCG/ACT inhaler Inhale 2 puffs into the lungs every 6 (six) hours as needed for wheezing.   amLODipine (NORVASC) 5 MG tablet Take 5 mg by mouth daily.   ARMOUR THYROID 15 MG tablet Take 15 mg by mouth daily.   BD PEN NEEDLE NANO 2ND GEN 32G X 4 MM MISC USE ONCE A WEEK   buPROPion (WELLBUTRIN XL) 300 MG 24 hr tablet Take 300 mg by mouth at bedtime.   cetirizine (ZYRTEC) 10 MG tablet Take 10 mg by mouth daily.   clobetasol ointment (TEMOVATE) 7.35 % Apply 1 application topically 2 (two) times daily.   COLLAGEN PO Take 1 tablet by mouth daily at 6 (six) AM.   fluticasone (FLONASE) 50 MCG/ACT nasal spray    folic acid (FOLVITE) 329 MCG tablet Take 800 mcg by mouth daily.    Menthol, Topical Analgesic, 4 % GEL Apply 1-2 application topically 4 (four) times daily as needed (for pain.).   methotrexate (RHEUMATREX) 2.5 MG tablet Take 20 mg by mouth every Sunday. Caution:Chemotherapy. Protect from light.   montelukast (SINGULAIR) 10 MG tablet Take 10 mg by mouth at bedtime.   naproxen (NAPROSYN)  500 MG tablet Take 1 tablet (500 mg total) by mouth 2 (two) times daily.   omeprazole (PRILOSEC) 20 MG capsule Take 20 mg by mouth daily.   OZEMPIC, 1 MG/DOSE, 4 MG/3ML SOPN INJECT 1 MG INTO THE SKIN ONCE A WEEK.   Pediatric Multiple Vit-Vit C (VITAMIN DAILY PO) Vitamin D  2000 IU   pregabalin (LYRICA) 25 MG capsule Take 1-3 capsules (25-75 mg total) by mouth at bedtime as needed  (nerve pain).   Secukinumab 150 MG/ML SOSY Inject 300 mg into the skin every 30 (thirty) days.   VITAMIN D, CHOLECALCIFEROL, PO Take 1 tablet by mouth daily at 6 (six) AM.   WIXELA INHUB 250-50 MCG/DOSE AEPB Inhale 1 puff into the lungs 2 (two) times daily.     PAST MEDICAL HISTORY: Past Medical History:  Diagnosis Date   Allergy    takes Zyrtec daily   Anemia    Anxiety    Arthritis    psorriatric arthritis   Asthma    Albuterol as needed as well as neb as needed   Back pain    Bilateral swelling of feet    Chronic back pain    pinched nerve   Constipation    Cough    Depression    Family history of adverse reaction to anesthesia    pts dad gets sick with anesthesia   Fibromyalgia    takes Effexor daily   Fibromyalgia    Gallbladder problem    GERD (gastroesophageal reflux disease)    Headache    seasonal    History of bronchitis 2016   History of shingles    Hypertension    takes Amlodipine daily   Hypothyroid    Infertility, female    Joint pain    Joint pain    Joint swelling    Muscle spasm    takes Robaxin as needed   Osteoarthritis    Osteoarthritis    Palpitations    Pneumonia    hx of-10+ yrs ago   Prediabetes    Psoriasis    Psoriatic arthritis ([place])    Shortness of breath    Vitamin D deficiency     PAST SURGICAL HISTORY: Past Surgical History:  Procedure Laterality Date   APPENDECTOMY     BIOPSY THYROID  2014   results benign   CHOLECYSTECTOMY  2008   DIAGNOSTIC LAPAROSCOPY     d/c endometriosis   DILATION AND CURETTAGE OF UTERUS     INSERTION OF MESH N/A 06/15/2016   Procedure: INSERTION OF MESH;  Surgeon: Autumn Messing III, MD;  Location: [facility];  Service: General;  Laterality: N/A;   KNEE ARTHROSCOPY Bilateral 2011   MASS EXCISION Right 04/21/2014   Procedure: EXCISION OF RIGHT BACK MASS;  Surgeon: Coralie Keens, MD;  Location: [facility];  Service: General;  Laterality: Right;   TONSILLECTOMY     VENTRAL HERNIA REPAIR   06/15/2016   VENTRAL HERNIA REPAIR N/A 06/15/2016   Procedure: LAPAROSCOPIC VENTRAL HERNIA WITH MESH;  Surgeon: Autumn Messing III, MD;  Location: [facility];  Service: General;  Laterality: N/A;    FAMILY HISTORY: The patient family history includes Diabetes in her mother; Heart disease in her mother; Hyperlipidemia in her mother; Hypertension in her father and mother; Kidney disease in her mother; Obesity in her mother; Stroke in her mother; Thyroid disease in her mother.  SOCIAL HISTORY:  The patient  reports that she has never smoked. She has never used smokeless tobacco.  She reports current alcohol use. She reports that she does not use drugs.  REVIEW OF SYSTEMS: Review of Systems  Constitutional: Negative for chills and fever.  HENT:  Negative for hoarse voice and nosebleeds.   Eyes:  Negative for discharge, double vision and pain.  Cardiovascular:  Negative for claudication, dyspnea on exertion, leg swelling, near-syncope, orthopnea, palpitations, paroxysmal nocturnal dyspnea and syncope.  Respiratory:  Negative for hemoptysis and shortness of breath.   Musculoskeletal:  Negative for muscle cramps and myalgias.  Gastrointestinal:  Negative for abdominal pain, constipation, diarrhea, hematemesis, hematochezia, melena, nausea and vomiting.  Neurological:  Negative for dizziness and light-headedness.   PHYSICAL EXAM: Vitals with BMI 12/20/2020 12/15/2020 11/19/2020  Height 5' 4" 5' 4" 5' 4"  Weight 217 lbs 213 lbs 218 lbs 3 oz  BMI 37.23 16.38 45.36  Systolic 468 032 122  Diastolic 81 83 88  Pulse 74 71 63    CONSTITUTIONAL: Well-developed and well-nourished. No acute distress.  SKIN: Skin is warm and dry. No rash noted. No cyanosis. No pallor. No jaundice HEAD: Normocephalic and atraumatic.  EYES: No scleral icterus MOUTH/THROAT: Moist oral membranes.  NECK: No JVD present. No thyromegaly noted. No carotid bruits  LYMPHATIC: No visible cervical adenopathy.  CHEST Normal respiratory  effort. No intercostal retractions  LUNGS: Clear to auscultation bilaterally.  No stridor. No wheezes. No rales.  CARDIOVASCULAR: Regular rate and rhythm, positive S1-S2, no murmurs rubs or gallops appreciated ABDOMINAL: Obese, soft, nontender, nondistended, positive bowel sounds in all 4 quadrants, no apparent ascites.  EXTREMITIES: No peripheral edema, psoriasis noted around the right olecranon process, warm to touch bilaterally, 2+ dorsalis pedis and posterior tibial pulses. HEMATOLOGIC: No significant bruising NEUROLOGIC: Oriented to person, place, and time. Nonfocal. Normal muscle tone.  PSYCHIATRIC: Normal mood and affect. Normal behavior. Cooperative  CARDIAC DATABASE: EKG: 12/20/2020: Normal sinus rhythm, 69 bpm, consider old anteroseptal infarct, without underlying ischemia or injury pattern.   Echocardiogram: No results found for this or any previous visit from the past 1095 days.  Stress Testing:  No results found for this or any previous visit from the past 1095 days.  Heart Catheterization: None   LABORATORY DATA: CBC Latest Ref Rng & Units 08/31/2020 04/01/2020 08/08/2017  WBC 3.4 - 10.8 x10E3/uL 8.5 8.7 10.5  Hemoglobin 11.1 - 15.9 g/dL 13.5 13.5 14.0  Hematocrit 34.0 - 46.6 % 42.5 40.7 43.3  Platelets 150 - 450 x10E3/uL 281 267 293    CMP Latest Ref Rng & Units 08/31/2020 04/01/2020 08/08/2017  Glucose 65 - 99 mg/dL 99 99 127(H)  BUN 6 - 24 mg/dL _0 Creatinine 0.57 - 1.00 mg/dL 0.59 0.54(L) 0.56  Sodium 134 - 144 mmol/L 141 140 139  Potassium 3.5 - 5.2 mmol/L 4.5 4.9 3.9  Chloride 96 - 106 mmol/L 103 102 105  CO2 20 - 29 mmol/L _1 Calcium 8.7 - 10.2 mg/dL 9.3 9.2 9.3  Total Protein 6.0 - 8.5 g/dL 7.1 6.9 -  Total Bilirubin 0.0 - 1.2 mg/dL 0.2 0.3 -  Alkaline Phos 44 - 121 IU/L 95 97 -  AST 0 - 40 IU/L 24 14 -  ALT 0 - 32 IU/L 29 13 -    Lipid Panel     Component Value Date/Time   CHOL 166 08/31/2020 0750   TRIG 113 08/31/2020 0750   HDL 43  08/31/2020 0750   CHOLHDL 3.9 08/31/2020 0750   CHOLHDL 3 01/22/2017 1132   VLDL 10.0 01/22/2017 1132  LDLCALC 102 (H) 08/31/2020 0750   LABVLDL 21 08/31/2020 0750    No components found for: NTPROBNP No results for input(s): PROBNP in the last 8760 hours. Recent Labs    04/01/20 0754 08/31/20 0750  TSH 2.390 2.090    BMP Recent Labs    04/01/20 0754 08/31/20 0750  NA 140 141  K 4.9 4.5  CL 102 103  CO2 25 22  GLUCOSE 99 99  BUN 15 12  CREATININE 0.54* 0.59  CALCIUM 9.2 9.3  GFRNONAA 105 102  GFRAA 121 118    HEMOGLOBIN A1C Lab Results  Component Value Date   HGBA1C 5.7 (H) 08/31/2020   External Labs: Collected: December 16, 2020. Creatinine 0.58 mg/dL. eGFR: >60 mL/min per 1.73 m Lipid profile: Total cholesterol 180, HDL 44, triglycerides 95, LDL calculated 119 Hemoglobin A1c: 5.6 Hemoglobin 13.1 g/dL, hematocrit 40.1%   IMPRESSION:    ICD-10-CM   1. Preop cardiovascular exam  Z01.810 PCV ECHOCARDIOGRAM COMPLETE    PCV MYOCARDIAL PERFUSION WO LEXISCAN    2. Nonspecific abnormal electrocardiogram (ECG) (EKG)  R94.31 PCV ECHOCARDIOGRAM COMPLETE    PCV MYOCARDIAL PERFUSION WO LEXISCAN    3. Essential hypertension  I10 EKG 12-Lead    4. Type 2 diabetes mellitus without complication, without long-term current use of insulin (HCC)  E11.9     5. Gastroesophageal reflux disease, unspecified whether esophagitis present  K21.9     6. Class 2 severe obesity due to excess calories with serious comorbidity and body mass index (BMI) of 37.0 to 37.9 in adult (HCC)  E66.01    Z68.37     7. Hypercholesteremia  E78.00     8. History of COVID-19  Z86.16        RECOMMENDATIONS: Nihal Breona Cherubin is a 60 y.o. female whose past medical history and cardiac risk factors include: Hypertension, GERD, obesity due to excess calories, asthma, hypothyroidism non-insulin-dependent diabetes mellitus type 2, hypercholesterolemia, vitamin D deficiency,, history of COVID-19  infection.  Preoperative risk stratification: EKG shows normal sinus rhythm with possible old anteroseptal infarct.  When compared to prior EKGs this appears to be a new finding in the recent past. Clinically patient is unable to comment on effort related chest pain as she is currently recovering for COVID-19 infection, has GERD, and psoriatic arthritis.   Patient's overall functional status is also limited due to osteoarthritis of the right knee. Given multiple cardiovascular risk factors including diabetes, not sure if she can complete 4 METS of activity level, and EKG findings recommend ischemic evaluation prior to an elective surgery.   Echocardiogram will be ordered to evaluate for structural heart disease and left ventricular systolic function.   Nuclear stress test recommended to evaluate for reversible ischemia.  Benign essential hypertension: Patient's blood pressure is within acceptable range. However, given her history of diabetes recommend discussing with PCP with regards to transitioning amlodipine to either ACE inhibitor's or ARB's for renal protection. Currently managed by primary care provider.  Non-insulin-dependent diabetes mellitus type 2: A1c within acceptable range. Patient is 10-year risk of ASCVD is estimated to be greater than 7.5%.   Recommend statin therapy.  Patient states that her PCP also recommended the same and she will follow-up with her. Defer glycemic management to PCP.  Obesity, due to excess calories: Body mass index is 37.25 kg/m. I reviewed with the patient the importance of diet, regular physical activity/exercise, weight loss.   Patient is educated on increasing physical activity gradually as tolerated.  With the goal  of moderate intensity exercise for 30 minutes a day 5 days a week.  FINAL MEDICATION LIST END OF ENCOUNTER: No orders of the defined types were placed in this encounter.   Medications Discontinued During This Encounter  Medication  Reason   Cyanocobalamin (VITAMIN B-12 CR PO) Error   DYMISTA 137-50 MCG/ACT SUSP Error   Phentermine HCl 8 MG TABS Error   BIOTIN PO Error     Current Outpatient Medications:    Acetaminophen (TYLENOL 8 HOUR PO), Take by mouth., Disp: , Rfl:    albuterol (PROVENTIL) (2.5 MG/3ML) 0.083% nebulizer solution, Take 2.5 mg by nebulization every 6 (six) hours as needed for wheezing or shortness of breath., Disp: , Rfl:    albuterol (VENTOLIN HFA) 108 (90 Base) MCG/ACT inhaler, Inhale 2 puffs into the lungs every 6 (six) hours as needed for wheezing., Disp: , Rfl:    amLODipine (NORVASC) 5 MG tablet, Take 5 mg by mouth daily., Disp: , Rfl:    ARMOUR THYROID 15 MG tablet, Take 15 mg by mouth daily., Disp: , Rfl:    BD PEN NEEDLE NANO 2ND GEN 32G X 4 MM MISC, USE ONCE A WEEK, Disp: 100 each, Rfl: 0   buPROPion (WELLBUTRIN XL) 300 MG 24 hr tablet, Take 300 mg by mouth at bedtime., Disp: , Rfl:    cetirizine (ZYRTEC) 10 MG tablet, Take 10 mg by mouth daily., Disp: , Rfl:    clobetasol ointment (TEMOVATE) 6.64 %, Apply 1 application topically 2 (two) times daily., Disp: , Rfl:    COLLAGEN PO, Take 1 tablet by mouth daily at 6 (six) AM., Disp: , Rfl:    fluticasone (FLONASE) 50 MCG/ACT nasal spray, , Disp: , Rfl:    folic acid (FOLVITE) 403 MCG tablet, Take 800 mcg by mouth daily. , Disp: , Rfl:    Menthol, Topical Analgesic, 4 % GEL, Apply 1-2 application topically 4 (four) times daily as needed (for pain.)., Disp: , Rfl:    methotrexate (RHEUMATREX) 2.5 MG tablet, Take 20 mg by mouth every Sunday. Caution:Chemotherapy. Protect from light., Disp: , Rfl:    montelukast (SINGULAIR) 10 MG tablet, Take 10 mg by mouth at bedtime., Disp: , Rfl:    naproxen (NAPROSYN) 500 MG tablet, Take 1 tablet (500 mg total) by mouth 2 (two) times daily., Disp: 30 tablet, Rfl: 0   omeprazole (PRILOSEC) 20 MG capsule, Take 20 mg by mouth daily., Disp: , Rfl:    OZEMPIC, 1 MG/DOSE, 4 MG/3ML SOPN, INJECT 1 MG INTO THE SKIN ONCE  A WEEK., Disp: 3 mL, Rfl: 0   Pediatric Multiple Vit-Vit C (VITAMIN DAILY PO), Vitamin D  2000 IU, Disp: , Rfl:    pregabalin (LYRICA) 25 MG capsule, Take 1-3 capsules (25-75 mg total) by mouth at bedtime as needed (nerve pain)., Disp: 90 capsule, Rfl: 3   Secukinumab 150 MG/ML SOSY, Inject 300 mg into the skin every 30 (thirty) days., Disp: , Rfl:    VITAMIN D, CHOLECALCIFEROL, PO, Take 1 tablet by mouth daily at 6 (six) AM., Disp: , Rfl:    WIXELA INHUB 250-50 MCG/DOSE AEPB, Inhale 1 puff into the lungs 2 (two) times daily., Disp: , Rfl:   Orders Placed This Encounter  Procedures   PCV MYOCARDIAL PERFUSION WO LEXISCAN   EKG 12-Lead   PCV ECHOCARDIOGRAM COMPLETE    There are no Patient Instructions on file for this visit.   --Continue cardiac medications as reconciled in final medication list. --Return in about 3 months (around 03/22/2021)  for Post -op and primary prevention.. Or sooner if needed. --Continue follow-up with your primary care physician regarding the management of your other chronic comorbid conditions.  Patient's questions and concerns were addressed to her satisfaction. She voices understanding of the instructions provided during this encounter.   This note was created using a voice recognition software as a result there may be grammatical errors inadvertently enclosed that do not reflect the nature of this encounter. Every attempt is made to correct such errors.  Rex Kras, [place], [facility]  Pager: [phone] Office: [phone]

## 2020-12-22 ENCOUNTER — Other Ambulatory Visit: Payer: Self-pay

## 2020-12-22 ENCOUNTER — Ambulatory Visit: Payer: BC Managed Care – PPO

## 2020-12-24 ENCOUNTER — Other Ambulatory Visit (INDEPENDENT_AMBULATORY_CARE_PROVIDER_SITE_OTHER): Payer: Self-pay | Admitting: Family Medicine

## 2020-12-24 DIAGNOSIS — E1169 Type 2 diabetes mellitus with other specified complication: Secondary | ICD-10-CM

## 2020-12-26 ENCOUNTER — Other Ambulatory Visit (INDEPENDENT_AMBULATORY_CARE_PROVIDER_SITE_OTHER): Payer: Self-pay | Admitting: Family Medicine

## 2020-12-26 DIAGNOSIS — E1169 Type 2 diabetes mellitus with other specified complication: Secondary | ICD-10-CM

## 2020-12-27 NOTE — Telephone Encounter (Signed)
Dr.Wallace °

## 2020-12-28 ENCOUNTER — Other Ambulatory Visit: Payer: Self-pay | Admitting: Internal Medicine

## 2020-12-28 ENCOUNTER — Other Ambulatory Visit: Payer: Self-pay

## 2020-12-28 ENCOUNTER — Ambulatory Visit
Admission: RE | Admit: 2020-12-28 | Discharge: 2020-12-28 | Disposition: A | Discharge status: Home / Self Care | Payer: BC Managed Care – PPO | Source: Ambulatory Visit | Attending: Internal Medicine | Admitting: Internal Medicine

## 2020-12-28 ENCOUNTER — Ambulatory Visit: Payer: BC Managed Care – PPO

## 2020-12-28 DIAGNOSIS — J40 Bronchitis, not specified as acute or chronic: Secondary | ICD-10-CM

## 2021-01-05 HISTORY — PX: REPLACEMENT TOTAL KNEE: SUR1224

## 2021-01-24 ENCOUNTER — Encounter (INDEPENDENT_AMBULATORY_CARE_PROVIDER_SITE_OTHER): Payer: Self-pay | Admitting: Family Medicine

## 2021-01-24 ENCOUNTER — Other Ambulatory Visit: Payer: Self-pay

## 2021-01-24 ENCOUNTER — Telehealth (INDEPENDENT_AMBULATORY_CARE_PROVIDER_SITE_OTHER): Payer: BC Managed Care – PPO | Admitting: Family Medicine

## 2021-01-24 DIAGNOSIS — Z6841 Body Mass Index (BMI) 40.0 and over, adult: Secondary | ICD-10-CM | POA: Diagnosis not present

## 2021-01-24 DIAGNOSIS — E1169 Type 2 diabetes mellitus with other specified complication: Secondary | ICD-10-CM

## 2021-01-24 DIAGNOSIS — Z96651 Presence of right artificial knee joint: Secondary | ICD-10-CM

## 2021-01-24 MED ORDER — WEGOVY 1.7 MG/0.75ML ~~LOC~~ SOAJ: 1.7000 mg | SUBCUTANEOUS | 0 refills | Status: DC

## 2021-01-25 ENCOUNTER — Encounter (INDEPENDENT_AMBULATORY_CARE_PROVIDER_SITE_OTHER): Payer: Self-pay

## 2021-01-27 NOTE — Progress Notes (Signed)
TeleHealth Visit:  Due to the COVID-19 pandemic, this visit was completed with telemedicine (audio/video) technology to reduce patient and provider exposure as well as to preserve personal protective equipment.   Natasha Lyons has verbally consented to this TeleHealth visit. The patient is located at home, the provider is located at the [facility] office. The participants in this visit include the listed provider and patient. The visit was conducted today via video.   Chief Complaint: OBESITY Natasha Lyons is here to discuss her progress with her obesity treatment plan along with follow-up of her obesity related diagnoses. Natasha Lyons is on the Category 3 Plan and states she is following her eating plan approximately 30-40% of the time. Natasha Lyons states she is doing PT 60 minutes 3 times per week.  Today's visit was #: 16 Starting weight: 240 lbs Starting date: 11/30/2020  Interim History: Natasha Lyons recently had rt TKR surgery and is unable to drive. She is going to PT 3 times a week. She has been relying on her husband to cook and is unable to adhere to plan well. She generally has a light breakfast and another meal. She is trying to eat the prescribed protein.  Subjective:   1. Type 2 diabetes mellitus with other specified complication, without long-term current use of insulin (HCC) Well controlled. Natasha Lyons's last A1c was 5.7. She was prescribed  Ozempic 2 mg weekly, but is on Ozempic 1 mg weekly because 2 mg is too expensive ($700/month). She has no history of thyroid cancer or pancreatitis. Status post cholecystectomy.   Lab Results  Component Value Date   HGBA1C 5.7 (H) 08/31/2020   HGBA1C 6.1 (H) 04/01/2020   HGBA1C 6.2 (H) 12/01/2019   Lab Results  Component Value Date   LDLCALC 102 (H) 08/31/2020   CREATININE 0.59 08/31/2020   Lab Results  Component Value Date   INSULIN 13.9 12/01/2019   2. H/O total knee replacement, right She is still having quite a bit of pain. She is going  to physical therapy and is almost at 90% flexion. Her surgery was 01/05/2021.  Assessment/Plan:   1. Type 2 diabetes mellitus with other specified complication, without long-term current use of insulin (HCC) Switch to [facility] in order to escalate dose of semaglutide.   New prescription- Semaglutide-Weight Management (WEGOVY) 1.7 MG/0.75ML SOAJ; Inject 1.7 mg into the skin once a week.  Dispense: 3 mL; Refill: 0  2. H/O total knee replacement, right Continue physical therapy and follow up with ortho as directed.  3. Obesity, current BMI 36.54  Natasha Lyons is currently in the action stage of change. As such, her goal is to continue with weight loss efforts. She has agreed to the Category 3 Plan.   Exercise goals:  Continue PT 3 times a week.  Behavioral modification strategies: increasing lean protein intake and planning for success.  Natasha Lyons has agreed to follow-up with our clinic in 3 weeks. Objective:   VITALS: Per patient if applicable, see vitals. GENERAL: Alert and in no acute distress. CARDIOPULMONARY: No increased WOB. Speaking in clear sentences.  PSYCH: Pleasant and cooperative. Speech normal rate and rhythm. Affect is appropriate. Insight and judgement are appropriate. Attention is focused, linear, and appropriate.  NEURO: Oriented as arrived to appointment on time with no prompting.   Lab Results  Component Value Date   CREATININE 0.59 08/31/2020   BUN 12 08/31/2020   NA 141 08/31/2020   K 4.5 08/31/2020   CL 103 08/31/2020   CO2 22 08/31/2020  Lab Results  Component Value Date   ALT 29 08/31/2020   AST 24 08/31/2020   ALKPHOS 95 08/31/2020   BILITOT 0.2 08/31/2020   Lab Results  Component Value Date   HGBA1C 5.7 (H) 08/31/2020   HGBA1C 6.1 (H) 04/01/2020   HGBA1C 6.2 (H) 12/01/2019   HGBA1C 6.4 04/20/2017   HGBA1C 6.5 01/22/2017   Lab Results  Component Value Date   INSULIN 13.9 12/01/2019   Lab Results  Component Value Date   TSH 2.090 08/31/2020    Lab Results  Component Value Date   CHOL 166 08/31/2020   HDL 43 08/31/2020   LDLCALC 102 (H) 08/31/2020   TRIG 113 08/31/2020   CHOLHDL 3.9 08/31/2020   Lab Results  Component Value Date   VD25OH 38.5 08/31/2020   VD25OH 38.2 04/01/2020   VD25OH 39.88 01/22/2017   Lab Results  Component Value Date   WBC 8.5 08/31/2020   HGB 13.5 08/31/2020   HCT 42.5 08/31/2020   MCV 82 08/31/2020   PLT 281 08/31/2020   No results found for: IRON, TIBC, FERRITIN  Attestation Statements:   Reviewed by clinician on day of visit: allergies, medications, problem list, medical history, surgical history, family history, social history, and previous encounter notes.  Coral Ceo, CMA, am acting as Location manager for [employer].  I have reviewed the above documentation for accuracy and completeness, and I agree with the above. - Georgianne Fick, FNP

## 2021-01-29 ENCOUNTER — Encounter (INDEPENDENT_AMBULATORY_CARE_PROVIDER_SITE_OTHER): Payer: Self-pay | Admitting: Family Medicine

## 2021-01-29 DIAGNOSIS — Z96651 Presence of right artificial knee joint: Secondary | ICD-10-CM | POA: Insufficient documentation

## 2021-02-22 ENCOUNTER — Ambulatory Visit (INDEPENDENT_AMBULATORY_CARE_PROVIDER_SITE_OTHER): Payer: Self-pay | Admitting: Family Medicine

## 2021-02-26 ENCOUNTER — Other Ambulatory Visit (INDEPENDENT_AMBULATORY_CARE_PROVIDER_SITE_OTHER): Payer: Self-pay | Admitting: Family Medicine

## 2021-02-26 DIAGNOSIS — E1169 Type 2 diabetes mellitus with other specified complication: Secondary | ICD-10-CM

## 2021-02-28 NOTE — Telephone Encounter (Signed)
Pt last seen by Dawn Whitmire, FNP.  

## 2021-02-28 NOTE — Telephone Encounter (Signed)
LAST APPOINTMENT DATE: 01/24/2021 NEXT APPOINTMENT DATE: 03/30/2021   [id number] [place] [facility] [address] [place]Phone: [phone]: [phone] Patient is requesting a refill of the following medications: Requested Prescriptions   Pending Prescriptions Disp Refills   WEGOVY 1.7 MG/0.75ML SOAJ [Pharmacy Med Name: WEGOVY 1.7 MG/0.75 ML PEN] 0.75 mL 0    Sig: INJECT 1.7 MG INTO THE SKIN ONCE A WEEK.    Date last filled: 01/26/2021 [facility] Results  Component Value Date   HGBA1C 5.7 (H) 08/31/2020   HGBA1C 6.1 (H) 04/01/2020   HGBA1C 6.2 (H) 12/01/2019   Lab Results  Component Value Date   LDLCALC 102 (H) 08/31/2020   CREATININE 0.59 08/31/2020   Lab Results  Component Value Date   VD25OH 38.5 08/31/2020   VD25OH 38.2 04/01/2020   VD25OH 39.88 01/22/2017    BP Readings from Last 3 Encounters:  12/20/20 137/81  12/15/20 134/83  11/19/20 (!) 143/88

## 2021-03-03 ENCOUNTER — Other Ambulatory Visit: Payer: Self-pay | Admitting: Internal Medicine

## 2021-03-03 DIAGNOSIS — Z1231 Encounter for screening mammogram for malignant neoplasm of breast: Secondary | ICD-10-CM

## 2021-03-04 ENCOUNTER — Ambulatory Visit: Payer: BC Managed Care – PPO | Admitting: Podiatry

## 2021-03-05 ENCOUNTER — Ambulatory Visit
Admission: RE | Admit: 2021-03-05 | Discharge: 2021-03-05 | Disposition: A | Discharge status: Home / Self Care | Payer: BC Managed Care – PPO | Source: Ambulatory Visit

## 2021-03-05 ENCOUNTER — Other Ambulatory Visit: Payer: Self-pay

## 2021-03-05 DIAGNOSIS — Z1231 Encounter for screening mammogram for malignant neoplasm of breast: Secondary | ICD-10-CM

## 2021-03-07 ENCOUNTER — Other Ambulatory Visit: Payer: Self-pay

## 2021-03-07 ENCOUNTER — Encounter: Payer: Self-pay | Admitting: Obstetrics and Gynecology

## 2021-03-07 ENCOUNTER — Ambulatory Visit (INDEPENDENT_AMBULATORY_CARE_PROVIDER_SITE_OTHER): Payer: BC Managed Care – PPO | Admitting: Obstetrics and Gynecology

## 2021-03-07 ENCOUNTER — Other Ambulatory Visit (HOSPITAL_COMMUNITY)
Admission: RE | Admit: 2021-03-07 | Discharge: 2021-03-07 | Disposition: A | Discharge status: Home / Self Care | Payer: BC Managed Care – PPO | Source: Ambulatory Visit | Attending: Obstetrics and Gynecology | Admitting: Obstetrics and Gynecology

## 2021-03-07 VITALS — BP 136/82 | HR 78 | Ht 63.75 in | Wt 213.0 lb

## 2021-03-07 DIAGNOSIS — Z124 Encounter for screening for malignant neoplasm of cervix: Secondary | ICD-10-CM | POA: Insufficient documentation

## 2021-03-07 DIAGNOSIS — Z01419 Encounter for gynecological examination (general) (routine) without abnormal findings: Secondary | ICD-10-CM

## 2021-03-07 DIAGNOSIS — N952 Postmenopausal atrophic vaginitis: Secondary | ICD-10-CM | POA: Diagnosis not present

## 2021-03-07 DIAGNOSIS — M47812 Spondylosis without myelopathy or radiculopathy, cervical region: Secondary | ICD-10-CM | POA: Insufficient documentation

## 2021-03-07 DIAGNOSIS — M797 Fibromyalgia: Secondary | ICD-10-CM | POA: Insufficient documentation

## 2021-03-07 DIAGNOSIS — N941 Unspecified dyspareunia: Secondary | ICD-10-CM

## 2021-03-07 DIAGNOSIS — M545 Low back pain, unspecified: Secondary | ICD-10-CM | POA: Insufficient documentation

## 2021-03-07 DIAGNOSIS — R209 Unspecified disturbances of skin sensation: Secondary | ICD-10-CM | POA: Insufficient documentation

## 2021-03-07 DIAGNOSIS — M1990 Unspecified osteoarthritis, unspecified site: Secondary | ICD-10-CM | POA: Insufficient documentation

## 2021-03-07 DIAGNOSIS — M2550 Pain in unspecified joint: Secondary | ICD-10-CM | POA: Insufficient documentation

## 2021-03-07 DIAGNOSIS — R6882 Decreased libido: Secondary | ICD-10-CM

## 2021-03-07 MED ORDER — ESTRADIOL 0.1 MG/GM VA CREA: TOPICAL_CREAM | VAGINAL | 1 refills | Status: DC

## 2021-03-07 NOTE — Patient Instructions (Signed)
Try Uberlube for vaginal lubrication.

## 2021-03-07 NOTE — Progress Notes (Signed)
60 y.o. G0 Married  Not Hispanic or Latino female here for annual exam. No vaginal bleeding.  Patient states that she is having low libido, harder to orgasm. Vaginal dryness, pain with sex even with lubrication.    In the last 2 years her clitoris has become more sensitive, to the point where it can be uncomfortable.   No bowel or bladder c/o.    She has been working with Dr Juleen China and has lost 30 lb since last summer. Had a knee replacement last 01-19-2023.  Her Mom died last year.   Patient's last menstrual period was 07/10/2018.          Sexually active: No.  The current method of family planning is post menopausal status.    Exercising: Yes.     Physical therapy Smoker:  no  Health Maintenance: Pap:  03/23/14 normal   History of abnormal Pap:  no MMG:  03/05/21  BMD:   none  Colonoscopy: right after she turned 52, normal.  TDaP:  03/23/17  Gardasil: none    reports that she has never smoked. She has never used smokeless tobacco. She reports current alcohol use. She reports that she does not use drugs. Social ETOH. She is a Chief Technology Officer, she is now supporting the other special Counselling psychologist. She has a 10 year old adopted child, she is getting her graduate degree at [employer], will be a psychologist.   Past Medical History:  Diagnosis Date   Allergy    takes Zyrtec daily   Anemia    Anxiety    Arthritis    psorriatric arthritis   Asthma    Albuterol as needed as well as neb as needed   Back pain    Bilateral swelling of feet    Chronic back pain    pinched nerve   Constipation    Cough    Depression    Family history of adverse reaction to anesthesia    pts dad gets sick with anesthesia   Fibromyalgia    takes Effexor daily   Fibromyalgia    Gallbladder problem    GERD (gastroesophageal reflux disease)    Headache    seasonal    History of bronchitis 2016   History of shingles    Hypertension    takes Amlodipine daily   Hypothyroid    Infertility, female     Joint pain    Joint pain    Joint swelling    Muscle spasm    takes Robaxin as needed   Osteoarthritis    Osteoarthritis    Palpitations    Pneumonia    hx of-10+ yrs ago   Prediabetes    Psoriasis    Psoriatic arthritis ([place])    Shortness of breath    Vitamin D deficiency     Past Surgical History:  Procedure Laterality Date   APPENDECTOMY     BIOPSY THYROID  2014   results benign   CHOLECYSTECTOMY  2008   DIAGNOSTIC LAPAROSCOPY     d/c endometriosis   DILATION AND CURETTAGE OF UTERUS     INSERTION OF MESH N/A 06/15/2016   Procedure: INSERTION OF MESH;  Surgeon: Autumn Messing III, MD;  Location: [facility];  Service: General;  Laterality: N/A;   KNEE ARTHROSCOPY Bilateral 2011   MASS EXCISION Right 04/21/2014   Procedure: EXCISION OF RIGHT BACK MASS;  Surgeon: Coralie Keens, MD;  Location: [facility];  Service: General;  Laterality: Right;   REPLACEMENT  TOTAL KNEE Right 01/05/2021   TONSILLECTOMY     VENTRAL HERNIA REPAIR  06/15/2016   VENTRAL HERNIA REPAIR N/A 06/15/2016   Procedure: LAPAROSCOPIC VENTRAL HERNIA WITH MESH;  Surgeon: Autumn Messing III, MD;  Location: [facility];  Service: General;  Laterality: N/A;    Current Outpatient Medications  Medication Sig Dispense Refill   Acetaminophen (TYLENOL 8 HOUR PO) Take by mouth.     albuterol (PROVENTIL) (2.5 MG/3ML) 0.083% nebulizer solution Take 2.5 mg by nebulization every 6 (six) hours as needed for wheezing or shortness of breath.     albuterol (VENTOLIN HFA) 108 (90 Base) MCG/ACT inhaler Inhale 2 puffs into the lungs every 6 (six) hours as needed for wheezing.     amLODipine (NORVASC) 5 MG tablet Take 5 mg by mouth daily.     ARMOUR THYROID 15 MG tablet Take 15 mg by mouth daily.     BD PEN NEEDLE NANO 2ND GEN 32G X 4 MM MISC USE ONCE A WEEK 100 each 0   cetirizine (ZYRTEC) 10 MG tablet Take 10 mg by mouth daily.     clobetasol ointment (TEMOVATE) [id number] % Apply 1 application topically 2 (two) times daily.      COLLAGEN PO Take 1 tablet by mouth daily at 6 (six) AM.     fluticasone (FLONASE) 50 MCG/ACT nasal spray      folic acid (FOLVITE) [id number] MCG tablet Take 800 mcg by mouth daily.      methotrexate (RHEUMATREX) 2.5 MG tablet Take 20 mg by mouth every Sunday. Caution:Chemotherapy. Protect from light.     montelukast (SINGULAIR) 10 MG tablet Take 10 mg by mouth at bedtime.     naproxen (NAPROSYN) 500 MG tablet Take 1 tablet (500 mg total) by mouth 2 (two) times daily. 30 tablet 0   omeprazole (PRILOSEC) 20 MG capsule Take 20 mg by mouth daily.     Secukinumab 150 MG/ML SOSY Inject 300 mg into the skin every 30 (thirty) days.     VITAMIN D, CHOLECALCIFEROL, PO Take 1 tablet by mouth daily at 6 (six) AM.     WEGOVY 1.7 MG/0.75ML SOAJ INJECT 1.7 MG INTO THE SKIN ONCE A WEEK. 0.75 mL 0   WIXELA INHUB 250-50 MCG/DOSE AEPB Inhale 1 puff into the lungs 2 (two) times daily.     No current facility-administered medications for this visit.    Family History  Problem Relation Age of Onset   Diabetes Mother    Hypertension Mother    Hyperlipidemia Mother    Heart disease Mother    Stroke Mother    Kidney disease Mother    Thyroid disease Mother    Obesity Mother    Hypertension Father     Review of Systems  Genitourinary:  Positive for vaginal pain.  All other systems reviewed and are negative.  Exam:   BP 136/82   Pulse 78   Ht 5' 3.75" (1.619 m)   Wt 213 lb (96.6 kg)   LMP 07/10/2018   SpO2 96%   BMI 36.85 kg/m   Weight change: '@WEIGHTCHANGE'$ @ Height:   Height: 5' 3.75" (161.9 cm)  Ht Readings from Last 3 Encounters:  03/07/21 5' 3.75" (1.619 m)  12/20/20 '5\' 4"'$  (1.626 m)  12/15/20 '5\' 4"'$  (1.626 m)    General appearance: alert, cooperative and appears stated age Head: Normocephalic, without obvious abnormality, atraumatic Neck: no adenopathy, supple, symmetrical, trachea midline and thyroid normal to inspection and palpation Lungs: clear to auscultation bilaterally Cardiovascular:  regular  rate and rhythm Breasts: normal appearance, no masses or tenderness Abdomen: soft, non-tender; non distended,  no masses,  no organomegaly Extremities: extremities normal, atraumatic, no cyanosis or edema Skin: Skin color, texture, turgor normal. No rashes or lesions Lymph nodes: Cervical, supraclavicular, and axillary nodes normal. No abnormal inguinal nodes palpated Neurologic: Grossly normal   Pelvic: External genitalia:  no lesions              Urethra:  normal appearing urethra with no masses, tenderness or lesions              Bartholins and Skenes: normal                 Vagina: atrophic appearing vagina with normal color and discharge, no lesions              Cervix: no lesions               Bimanual Exam:  Uterus:  normal size, contour, position, consistency, mobility, non-tender              Adnexa: no mass, fullness, tenderness               Rectovaginal: Confirms               Anus:  normal sphincter tone, no lesions  Gae Dry chaperoned for the exam.  1. Well woman exam Discussed breast self exam Discussed calcium and vit D intake Labs with primary Colonoscopy at 62 Mammogram just done  2. Screening for cervical cancer - Cytology - PAP  3. Vaginal atrophy Discussed options, wants to start estrogem - estradiol (ESTRACE) 0.1 MG/GM vaginal cream; 1 gram vaginally qhs x 1 week, then change to twice weekly. Use a pea sized amount externally at the same time.  Dispense: 42.5 g; Refill: 1  4. Dyspareunia, female - estradiol (ESTRACE) 0.1 MG/GM vaginal cream; 1 gram vaginally qhs x 1 week, then change to twice weekly. Use a pea sized amount externally at the same time.  Dispense: 42.5 g; Refill: 1 -She will call if her clitoral sensitivity doesn't improve on the estrogen.   5. Low libido Check testosterone levels She would like to start testosterone, discussed risks. Will await lab work.

## 2021-03-08 LAB — CYTOLOGY - PAP
Comment: NEGATIVE
Diagnosis: NEGATIVE
High risk HPV: NEGATIVE

## 2021-03-11 ENCOUNTER — Encounter: Payer: Self-pay | Admitting: Podiatry

## 2021-03-11 ENCOUNTER — Other Ambulatory Visit: Payer: Self-pay

## 2021-03-11 ENCOUNTER — Ambulatory Visit: Payer: BC Managed Care – PPO | Admitting: Podiatry

## 2021-03-11 DIAGNOSIS — M779 Enthesopathy, unspecified: Secondary | ICD-10-CM

## 2021-03-11 DIAGNOSIS — M25879 Other specified joint disorders, unspecified ankle and foot: Secondary | ICD-10-CM

## 2021-03-12 ENCOUNTER — Encounter (INDEPENDENT_AMBULATORY_CARE_PROVIDER_SITE_OTHER): Payer: Self-pay | Admitting: Family Medicine

## 2021-03-12 DIAGNOSIS — E1169 Type 2 diabetes mellitus with other specified complication: Secondary | ICD-10-CM

## 2021-03-14 NOTE — Progress Notes (Signed)
Subjective:   Patient ID: Natasha Lyons, female   DOB: 60 y.o.   MRN: [id number]   HPI Patient states she has developed increased pain on the plantar aspect of her right foot after having had knee replacement and trying to be more active.  Patient states it does get swollen and she feels like she is walking directly on the bone   ROS      Objective:  Physical Exam  Neurovascular status intact with significant plantarflexion of the first metatarsal right over left rigid contracture noted of the metatarsal bone with plantar pain     Assessment:  Plantar irritation around the first metatarsal head right over left with diminished fat pad secondary to structure with history of knee replacement this year     Plan:  H&P condition reviewed and discussed.  I recommended orthotics and patient will be casted for functional orthotics today to try to disperse weight off the joint with consideration for possible elevating osteotomy or sesamoidectomy if needed at 1 point in future

## 2021-03-15 ENCOUNTER — Other Ambulatory Visit: Payer: Self-pay | Admitting: *Deleted

## 2021-03-15 ENCOUNTER — Other Ambulatory Visit: Payer: Self-pay | Admitting: Obstetrics and Gynecology

## 2021-03-15 DIAGNOSIS — R6882 Decreased libido: Secondary | ICD-10-CM

## 2021-03-15 LAB — T4, FREE: T4,Free (Direct): 0.81

## 2021-03-15 LAB — VITAMIN D 25 HYDROXY (VIT D DEFICIENCY, FRACTURES): Vit D, 25-Hydroxy: 24.6

## 2021-03-15 LAB — TSH: TSH: 1.44 (ref 0.41–5.90)

## 2021-03-15 LAB — TESTOS,TOTAL,FREE AND SHBG (FEMALE)
Free Testosterone: 2 pg/mL (ref 0.1–6.4)
Sex Hormone Binding: 43 nmol/L (ref 14–73)
Testosterone, Total, LC-MS-MS: 15 ng/dL (ref 2–45)

## 2021-03-15 LAB — T3, FREE: T3, Free: 2.99

## 2021-03-15 LAB — IRON,TIBC AND FERRITIN PANEL: Ferritin: 41.9

## 2021-03-15 MED ORDER — NONFORMULARY OR COMPOUNDED ITEM: 0 refills | Status: DC

## 2021-03-15 NOTE — Telephone Encounter (Signed)
Pt last seen by Dawn Whitmire, FNP.  

## 2021-03-16 MED ORDER — BLOOD GLUCOSE MONITOR KIT: PACK | 0 refills | Status: AC

## 2021-03-16 NOTE — Telephone Encounter (Signed)
Please review and advise.

## 2021-03-22 ENCOUNTER — Ambulatory Visit: Payer: BC Managed Care – PPO | Admitting: Cardiology

## 2021-03-24 ENCOUNTER — Ambulatory Visit: Payer: BC Managed Care – PPO | Admitting: Cardiology

## 2021-03-30 ENCOUNTER — Encounter (INDEPENDENT_AMBULATORY_CARE_PROVIDER_SITE_OTHER): Payer: Self-pay | Admitting: Family Medicine

## 2021-03-30 ENCOUNTER — Ambulatory Visit (INDEPENDENT_AMBULATORY_CARE_PROVIDER_SITE_OTHER): Payer: BC Managed Care – PPO | Admitting: Family Medicine

## 2021-03-30 ENCOUNTER — Other Ambulatory Visit: Payer: Self-pay

## 2021-03-30 VITALS — BP 126/79 | HR 71 | Temp 98.2°F | Ht 64.0 in | Wt 211.0 lb

## 2021-03-30 DIAGNOSIS — E1169 Type 2 diabetes mellitus with other specified complication: Secondary | ICD-10-CM

## 2021-03-30 DIAGNOSIS — Z96651 Presence of right artificial knee joint: Secondary | ICD-10-CM | POA: Diagnosis not present

## 2021-03-30 DIAGNOSIS — E559 Vitamin D deficiency, unspecified: Secondary | ICD-10-CM

## 2021-03-30 DIAGNOSIS — R5383 Other fatigue: Secondary | ICD-10-CM | POA: Diagnosis not present

## 2021-03-30 DIAGNOSIS — Z9189 Other specified personal risk factors, not elsewhere classified: Secondary | ICD-10-CM

## 2021-03-30 DIAGNOSIS — R632 Polyphagia: Secondary | ICD-10-CM | POA: Diagnosis not present

## 2021-03-30 DIAGNOSIS — Z6841 Body Mass Index (BMI) 40.0 and over, adult: Secondary | ICD-10-CM

## 2021-03-30 MED ORDER — WEGOVY 1.7 MG/0.75ML ~~LOC~~ SOAJ: 1.7000 mg | SUBCUTANEOUS | 0 refills | Status: DC

## 2021-03-30 NOTE — Progress Notes (Signed)
Chief Complaint:   OBESITY Natasha Lyons is here to discuss her progress with her obesity treatment plan along with follow-up of her obesity related diagnoses.   Today's visit was #: 61 Starting weight: 240 lbs Starting date: 11/30/2020 Today's weight: 211 lbs Today's date: 03/30/2021 Weight change since last visit: 2 lbs Total lbs lost to date: 29 lbs Body mass index is 36.22 kg/m.  Total weight loss percentage to date: -12.08%  Current Meal Plan: the Category 3 Plan for 80-90% of the time.  Current Exercise Plan: Cardio/strength training for 30-45 minutes 5-7 times per week. Current Anti-Obesity Medications: Wegovy 1.7 mg subcutaneously weekly. Side effects: None.  Interim History:  Zelia's blood sugars have been 118, 109, 120, 119, 114, 105, 116.  She had right TKR on June 29 - working out "like a fiend".  Going back to work next week (out 3 months).    Assessment/Plan:   1. Other fatigue After exercise.  Likely related to low BP or low BS. We discussed monitoring parameters and red flags. Recommended pre-workout snack.  2. Vitamin D deficiency Improving, but not optimized.  She is taking vitamin D 50,000 IU weekly, per PCP.  Plan: Continue to take prescription Vitamin D @50 ,000 IU every week as prescribed.  Follow-up for routine testing of Vitamin D, at least 2-3 times per year to avoid over-replacement.  Lab Results  Component Value Date   VD25OH 38.5 08/31/2020   VD25OH 38.2 04/01/2020   VD25OH 39.88 01/22/2017   3. Status post total right knee replacement Keelia had right TKR on 01/05/2021.  She is currently exercising and will be going back to work next week.  4. Polyphagia Controlled. Current treatment: Wegovy 1.7 mg subcutaneously weekly. She will continue to focus on protein-rich, low simple carbohydrate foods. We reviewed the importance of hydration, regular exercise for stress reduction, and restorative sleep.  5. Type 2 diabetes mellitus with other specified  complication, without long-term current use of insulin (HCC) Diabetes Mellitus: At goal. Medication: Wegovy 1.7 mg subcutaneously weekly.   Plan:  Continue [id number].  Will refill at same dose today.   Lab Results  Component Value Date   HGBA1C 5.7 (H) 08/31/2020   HGBA1C 6.1 (H) 04/01/2020   HGBA1C 6.2 (H) 12/01/2019   Lab Results  Component Value Date   LDLCALC 102 (H) 08/31/2020   CREATININE 0.59 08/31/2020   - Refill Semaglutide-Weight Management (WEGOVY) 1.7 MG/0.75ML SOAJ; Inject 1.7 mg into the skin once a week.  Dispense: 9 mL; Refill: 0  6. At risk for heart disease Due to Baneen's current state of health and medical condition(s), she is at a higher risk for heart disease.  This puts the patient at much greater risk to subsequently develop cardiopulmonary conditions that can significantly affect patient's quality of life in a negative manner.    At least 8 minutes were spent on counseling Datra about these concerns today. Evidence-based interventions for health behavior change were utilized today including the discussion of self monitoring techniques, problem-solving barriers, and SMART goal setting techniques.  Specifically, regarding patient's less desirable eating habits and patterns, we employed the technique of small changes when Karalyn has not been able to fully commit to her prudent nutritional plan.  7. Obesity, current BMI 36.2  Course: Indiyah is currently in the action stage of change. As such, her goal is to continue with weight loss efforts.   Nutrition goals: She has agreed to the Category 3 Plan.   Exercise goals:  BeActive Knee Brace.  Behavioral modification strategies: increasing lean protein intake, decreasing simple carbohydrates, and increasing vegetables. Early Bird - drink with electrolytes.   Aidee has agreed to follow-up with our clinic in 4 weeks. She was informed of the importance of frequent follow-up visits to maximize her success with intensive  lifestyle modifications for her multiple health conditions.   Objective:   Blood pressure 126/79, pulse 71, temperature 98.2 F (36.8 C), temperature source Oral, height 5\' 4"  (1.626 m), weight 211 lb (95.7 kg), last menstrual period 05/05/2019, SpO2 100 %. Body mass index is 36.22 kg/m.  General: Cooperative, alert, well developed, in no acute distress. HEENT: Conjunctivae and lids unremarkable. Cardiovascular: Regular rhythm.  Lungs: Normal work of breathing. Neurologic: No focal deficits.   Lab Results  Component Value Date   CREATININE 0.59 08/31/2020   BUN 12 08/31/2020   NA 141 08/31/2020   K 4.5 08/31/2020   CL 103 08/31/2020   CO2 22 08/31/2020   Lab Results  Component Value Date   ALT 29 08/31/2020   AST 24 08/31/2020   ALKPHOS 95 08/31/2020   BILITOT 0.2 08/31/2020   Lab Results  Component Value Date   HGBA1C 5.7 (H) 08/31/2020   HGBA1C 6.1 (H) 04/01/2020   HGBA1C 6.2 (H) 12/01/2019   HGBA1C 6.4 04/20/2017   HGBA1C 6.5 01/22/2017   Lab Results  Component Value Date   INSULIN 13.9 12/01/2019   Lab Results  Component Value Date   TSH 2.090 08/31/2020   Lab Results  Component Value Date   CHOL 166 08/31/2020   HDL 43 08/31/2020   LDLCALC 102 (H) 08/31/2020   TRIG 113 08/31/2020   CHOLHDL 3.9 08/31/2020   Lab Results  Component Value Date   VD25OH 38.5 08/31/2020   VD25OH 38.2 04/01/2020   VD25OH 39.88 01/22/2017   Lab Results  Component Value Date   WBC 8.5 08/31/2020   HGB 13.5 08/31/2020   HCT 42.5 08/31/2020   MCV 82 08/31/2020   PLT 281 08/31/2020   Attestation Statements:   Reviewed by clinician on day of visit: allergies, medications, problem list, medical history, surgical history, family history, social history, and previous encounter notes.  I, Water quality scientist, CMA, am acting as transcriptionist for Briscoe Deutscher, DO  I have reviewed the above documentation for accuracy and completeness, and I agree with the above. Briscoe Deutscher, DO

## 2021-04-05 ENCOUNTER — Encounter: Payer: BC Managed Care – PPO | Admitting: Obstetrics and Gynecology

## 2021-04-14 ENCOUNTER — Other Ambulatory Visit: Payer: Self-pay

## 2021-04-14 ENCOUNTER — Ambulatory Visit (INDEPENDENT_AMBULATORY_CARE_PROVIDER_SITE_OTHER): Payer: BC Managed Care – PPO | Admitting: *Deleted

## 2021-04-14 DIAGNOSIS — M722 Plantar fascial fibromatosis: Secondary | ICD-10-CM

## 2021-04-14 NOTE — Progress Notes (Signed)
Patient presents today to pick up custom molded foot orthotics, diagnosed with plantar fasciitis/sesamoiditis by Dr. Paulla Dolly.   Orthotics were dispensed and fit was satisfactory. Reviewed instructions for break-in and wear. Written instructions given to patient.  Patient will follow up as needed.   [facility] - order # [id number]

## 2021-04-27 ENCOUNTER — Encounter (INDEPENDENT_AMBULATORY_CARE_PROVIDER_SITE_OTHER): Payer: Self-pay | Admitting: Physician Assistant

## 2021-04-27 ENCOUNTER — Other Ambulatory Visit: Payer: Self-pay

## 2021-04-27 ENCOUNTER — Ambulatory Visit (INDEPENDENT_AMBULATORY_CARE_PROVIDER_SITE_OTHER): Payer: BC Managed Care – PPO | Admitting: Physician Assistant

## 2021-04-27 VITALS — BP 129/86 | HR 74 | Temp 97.6°F | Ht 64.0 in | Wt 208.0 lb

## 2021-04-27 DIAGNOSIS — E559 Vitamin D deficiency, unspecified: Secondary | ICD-10-CM | POA: Diagnosis not present

## 2021-04-27 DIAGNOSIS — Z6841 Body Mass Index (BMI) 40.0 and over, adult: Secondary | ICD-10-CM | POA: Diagnosis not present

## 2021-04-27 NOTE — Progress Notes (Signed)
Chief Complaint:   OBESITY Natasha Lyons is here to discuss her progress with her obesity treatment plan along with follow-up of her obesity related diagnoses. [name] is on the Category 3 Plan and states she is following her eating plan approximately 85% of the time. [name] states she is using stationary bike 30 minutes 5-6 times per week.  Today's visit was #: 18 Starting weight: 240 lbs Starting date: 11/30/2020 Today's weight: 208 lbs Today's date: 04/27/2021 Total lbs lost to date: 32 Total lbs lost since last in-office visit: 3  Interim History: [name] reports that her biggest issue is not eating enough protein. She has been eating her protein first during her meals. She is drinking a protein shake in the morning. She doesn't eat dairy.  Subjective:   1. Vitamin D deficiency Natasha Lyons is on prescription Vit D. PCP checked last Vit D level.  Assessment/Plan:   1. Vitamin D deficiency Low Vitamin D level contributes to fatigue and are associated with obesity, breast, and colon cancer. She agrees to continue to take prescription Vitamin D 50,000 IU every week and will follow-up for routine testing of Vitamin D, at least 2-3 times per year to avoid over-replacement. Check labs at next visit.  2. Obesity, current BMI 35.69  Natasha Lyons is currently in the action stage of change. As such, her goal is to continue with weight loss efforts. She has agreed to the Category 3 Plan.   Check fasting labs at next visit.  Exercise goals:  As is  Behavioral modification strategies: increasing lean protein intake, meal planning and cooking strategies, and better snacking choices.  [name] has agreed to follow-up with our clinic in 4 weeks. She was informed of the importance of frequent follow-up visits to maximize her success with intensive lifestyle modifications for her multiple health conditions.   Objective:   Blood pressure 129/86, pulse 74, temperature 97.6 F (36.4 C), height 5\' 4"  (1.626  m), weight 208 lb (94.3 kg), last menstrual period 05/05/2019, SpO2 97 %. Body mass index is 35.7 kg/m.  General: Cooperative, alert, well developed, in no acute distress. HEENT: Conjunctivae and lids unremarkable. Cardiovascular: Regular rhythm.  Lungs: Normal work of breathing. Neurologic: No focal deficits.   Lab Results  Component Value Date   CREATININE 0.59 08/31/2020   BUN 12 08/31/2020   NA 141 08/31/2020   K 4.5 08/31/2020   CL 103 08/31/2020   CO2 22 08/31/2020   Lab Results  Component Value Date   ALT 29 08/31/2020   AST 24 08/31/2020   ALKPHOS 95 08/31/2020   BILITOT 0.2 08/31/2020   Lab Results  Component Value Date   HGBA1C 5.7 (H) 08/31/2020   HGBA1C 6.1 (H) 04/01/2020   HGBA1C 6.2 (H) 12/01/2019   HGBA1C 6.4 04/20/2017   HGBA1C 6.5 01/22/2017   Lab Results  Component Value Date   INSULIN 13.9 12/01/2019   Lab Results  Component Value Date   TSH 2.090 08/31/2020   Lab Results  Component Value Date   CHOL 166 08/31/2020   HDL 43 08/31/2020   LDLCALC 102 (H) 08/31/2020   TRIG 113 08/31/2020   CHOLHDL 3.9 08/31/2020   Lab Results  Component Value Date   VD25OH 38.5 08/31/2020   VD25OH 38.2 04/01/2020   VD25OH 39.88 01/22/2017   Lab Results  Component Value Date   WBC 8.5 08/31/2020   HGB 13.5 08/31/2020   HCT 42.5 08/31/2020   MCV 82 08/31/2020   PLT 281 08/31/2020  Attestation Statements:   Reviewed by clinician on day of visit: allergies, medications, problem list, medical history, surgical history, family history, social history, and previous encounter notes.  Time spent on visit including pre-visit chart review and post-visit care and charting was 30 minutes.   Coral Ceo, CMA, am acting as transcriptionist for [employer], PA-C.  I have reviewed the above documentation for accuracy and completeness, and I agree with the above. Abby Potash, PA-C

## 2021-05-12 ENCOUNTER — Other Ambulatory Visit (INDEPENDENT_AMBULATORY_CARE_PROVIDER_SITE_OTHER): Payer: Self-pay | Admitting: Family Medicine

## 2021-05-23 ENCOUNTER — Encounter (INDEPENDENT_AMBULATORY_CARE_PROVIDER_SITE_OTHER): Payer: Self-pay | Admitting: Family Medicine

## 2021-05-23 ENCOUNTER — Ambulatory Visit (INDEPENDENT_AMBULATORY_CARE_PROVIDER_SITE_OTHER): Payer: BC Managed Care – PPO | Admitting: Family Medicine

## 2021-05-23 ENCOUNTER — Other Ambulatory Visit: Payer: Self-pay

## 2021-05-23 VITALS — BP 127/79 | HR 66 | Temp 97.7°F | Ht 64.0 in | Wt 206.0 lb

## 2021-05-23 DIAGNOSIS — E559 Vitamin D deficiency, unspecified: Secondary | ICD-10-CM

## 2021-05-23 DIAGNOSIS — E785 Hyperlipidemia, unspecified: Secondary | ICD-10-CM

## 2021-05-23 DIAGNOSIS — Z6841 Body Mass Index (BMI) 40.0 and over, adult: Secondary | ICD-10-CM

## 2021-05-23 DIAGNOSIS — E1169 Type 2 diabetes mellitus with other specified complication: Secondary | ICD-10-CM

## 2021-05-23 DIAGNOSIS — I152 Hypertension secondary to endocrine disorders: Secondary | ICD-10-CM

## 2021-05-23 DIAGNOSIS — E1159 Type 2 diabetes mellitus with other circulatory complications: Secondary | ICD-10-CM

## 2021-05-23 NOTE — Progress Notes (Signed)
Chief Complaint:   OBESITY Natasha Lyons is here to discuss her progress with her obesity treatment plan along with follow-up of her obesity related diagnoses. See Medical Weight Management Flowsheet for complete bioelectrical impedance results.  Today's visit was #: 32 Starting weight: 240 lbs Starting date: 11/30/2020 Weight change since last visit: 2 lbs Total lbs lost to date: 34 lbs Total weight loss percentage to date: -14.17%  Nutrition Plan: Category 3 Meal plan for 75% of the time.  Activity: Cardio/strength training for 20+ minutes 5 times per week.  Anti-obesity medications: Wegovy 1.7 mg subcutaneously weekly. Reported side effects: None.  Interim History: Natasha Lyons says she is focused on exercising regularly.  She has increased arthritic pain due to the cold weather.  She says that her father is declining and Palliative Care has been called in.  She has had the flu/allergies this month.  She still has a cough.  Assessment/Plan:   1. Type 2 diabetes mellitus with other specified complication, without long-term current use of insulin (HCC) Diabetes Mellitus: At goal. Medication: Wegovy 1.7 mg subcutaneously weekly. Issues reviewed: blood sugar goals, complications of diabetes mellitus, hypoglycemia prevention and treatment, exercise, and nutrition.  Plan: The patient will continue to focus on protein-rich, low simple carbohydrate foods. We reviewed the importance of hydration, regular exercise for stress reduction, and restorative sleep.  Check labs today.  Lab Results  Component Value Date   HGBA1C 5.7 (H) 08/31/2020   HGBA1C 6.1 (H) 04/01/2020   HGBA1C 6.2 (H) 12/01/2019   Lab Results  Component Value Date   LDLCALC 102 (H) 08/31/2020   CREATININE 0.59 08/31/2020   - CBC with Differential/Platelet - Comprehensive metabolic panel - Hemoglobin A1c - Microalbumin / creatinine urine ratio  2. Hyperlipidemia associated with type 2 diabetes mellitus ([place]) Course: Not at  goal. Lipid-lowering medications: None.   Plan: Dietary changes: Increase soluble fiber, decrease simple carbohydrates, decrease saturated fat. Exercise changes: Moderate to vigorous-intensity aerobic activity 150 minutes per week or as tolerated. We will continue to monitor along with PCP/specialists as it pertains to her weight loss journey.  Will check labs today.  Lab Results  Component Value Date   CHOL 166 08/31/2020   HDL 43 08/31/2020   LDLCALC 102 (H) 08/31/2020   TRIG 113 08/31/2020   CHOLHDL 3.9 08/31/2020   Lab Results  Component Value Date   ALT 29 08/31/2020   AST 24 08/31/2020   ALKPHOS 95 08/31/2020   BILITOT 0.2 08/31/2020   The 10-year ASCVD risk score (Arnett DK, et al., 2019) is: 7%   Values used to calculate the score:     Age: 60 years     Sex: Female     Is Non-Hispanic African American: No     Diabetic: Yes     Tobacco smoker: No     Systolic Blood Pressure: 408 mmHg     Is BP treated: Yes     HDL Cholesterol: 43 mg/dL     Total Cholesterol: 166 mg/dL  - Lipid panel  3. Hypertension associated with diabetes ([place]) At goal. Medications: Norvasc 5 mg daily.   Plan: Avoid buying foods that are: processed, frozen, or prepackaged to avoid excess salt. We will watch for signs of hypotension as she continues lifestyle modifications.  BP Readings from Last 3 Encounters:  05/23/21 127/79  04/27/21 129/86  03/30/21 126/79   Lab Results  Component Value Date   CREATININE 0.59 08/31/2020   4. Vitamin D deficiency Not at  goal.  She is taking vitamin D 50,000 IU weekly.  Plan: Continue to take prescription Vitamin D @50 ,000 IU every week as prescribed.  Will check vitamin D level today, as per below.  Lab Results  Component Value Date   VD25OH 24.6 03/15/2021   VD25OH 38.5 08/31/2020   VD25OH 38.2 04/01/2020   - VITAMIN D 25 Hydroxy (Vit-D Deficiency, Fractures)  5. Obesity, current BMI 35.5  Course: Natasha Lyons is currently in the action stage of  change. As such, her goal is to continue with weight loss efforts.   Nutrition goals: She has agreed to the Category 3 Plan.   Exercise goals:  As is.  Behavioral modification strategies: increasing lean protein intake, decreasing simple carbohydrates, increasing vegetables, and increasing water intake.  Natasha Lyons has agreed to follow-up with our clinic in 4 weeks. She was informed of the importance of frequent follow-up visits to maximize her success with intensive lifestyle modifications for her multiple health conditions.   Objective:   Blood pressure 127/79, pulse 66, temperature 97.7 F (36.5 C), temperature source Oral, height 5\' 4"  (1.626 m), weight 206 lb (93.4 kg), last menstrual period 05/05/2019, SpO2 95 %. Body mass index is 35.36 kg/m.  General: Cooperative, alert, well developed, in no acute distress. HEENT: Conjunctivae and lids unremarkable. Cardiovascular: Regular rhythm.  Lungs: Normal work of breathing. Neurologic: No focal deficits.   Lab Results  Component Value Date   CREATININE 0.59 08/31/2020   BUN 12 08/31/2020   NA 141 08/31/2020   K 4.5 08/31/2020   CL 103 08/31/2020   CO2 22 08/31/2020   Lab Results  Component Value Date   ALT 29 08/31/2020   AST 24 08/31/2020   ALKPHOS 95 08/31/2020   BILITOT 0.2 08/31/2020   Lab Results  Component Value Date   HGBA1C 5.7 (H) 08/31/2020   HGBA1C 6.1 (H) 04/01/2020   HGBA1C 6.2 (H) 12/01/2019   HGBA1C 6.4 04/20/2017   HGBA1C 6.5 01/22/2017   Lab Results  Component Value Date   INSULIN 13.9 12/01/2019   Lab Results  Component Value Date   TSH 1.44 03/15/2021   Lab Results  Component Value Date   CHOL 166 08/31/2020   HDL 43 08/31/2020   LDLCALC 102 (H) 08/31/2020   TRIG 113 08/31/2020   CHOLHDL 3.9 08/31/2020   Lab Results  Component Value Date   VD25OH 24.6 03/15/2021   VD25OH 38.5 08/31/2020   VD25OH 38.2 04/01/2020   Lab Results  Component Value Date   WBC 8.5 08/31/2020   HGB 13.5  08/31/2020   HCT 42.5 08/31/2020   MCV 82 08/31/2020   PLT 281 08/31/2020   Lab Results  Component Value Date   FERRITIN 41.9 03/15/2021   Attestation Statements:   Reviewed by clinician on day of visit: allergies, medications, problem list, medical history, surgical history, family history, social history, and previous encounter notes.  Time spent on visit including pre-visit chart review and post-visit care and documentation was 45 minutes. Time was spent on: Food choices and timing of food intake reviewed today. I discussed a personalized meal plan with the patient that will help her to lose weight and will improve her obesity-related conditions going forward. I performed a medically necessary appropriate examination and/or evaluation. I discussed the assessment and treatment plan with the patient. Motivational interviewing as well as evidence-based interventions for health behavior change were utilized today including the discussion of self monitoring techniques, problem-solving barriers and SMART goal setting techniques.  An exercise  prescription was reviewed.  The patient was provided an opportunity to ask questions and all were answered. The patient agreed with the plan and demonstrated an understanding of the instructions. Labs were ordered at this visit and will be reviewed at the next visit unless more critical results need to be addressed immediately. Clinical information was updated and documented in the EMR.  I, Water quality scientist, CMA, am acting as transcriptionist for Briscoe Deutscher, DO  I have reviewed the above documentation for accuracy and completeness, and I agree with the above. -  Briscoe Deutscher, DO, MS, FAAFP, DABOM - Family and Bariatric Medicine.

## 2021-05-24 LAB — HEMOGLOBIN A1C
Est. average glucose Bld gHb Est-mCnc: 120 mg/dL
Hgb A1c MFr Bld: 5.8 % — ABNORMAL HIGH (ref 4.8–5.6)

## 2021-05-24 LAB — COMPREHENSIVE METABOLIC PANEL
ALT: 16 IU/L (ref 0–32)
AST: 16 IU/L (ref 0–40)
Albumin/Globulin Ratio: 1.7 (ref 1.2–2.2)
Albumin: 4.3 g/dL (ref 3.8–4.9)
Alkaline Phosphatase: 94 IU/L (ref 44–121)
BUN/Creatinine Ratio: 24 — ABNORMAL HIGH (ref 9–23)
BUN: 13 mg/dL (ref 6–24)
Bilirubin Total: 0.3 mg/dL (ref 0.0–1.2)
CO2: 23 mmol/L (ref 20–29)
Calcium: 9.1 mg/dL (ref 8.7–10.2)
Chloride: 103 mmol/L (ref 96–106)
Creatinine, Ser: 0.54 mg/dL — ABNORMAL LOW (ref 0.57–1.00)
Globulin, Total: 2.6 g/dL (ref 1.5–4.5)
Glucose: 91 mg/dL (ref 70–99)
Potassium: 4 mmol/L (ref 3.5–5.2)
Sodium: 140 mmol/L (ref 134–144)
Total Protein: 6.9 g/dL (ref 6.0–8.5)
eGFR: 107 mL/min/{1.73_m2} (ref >59)

## 2021-05-24 LAB — LIPID PANEL
Chol/HDL Ratio: 3.8 ratio (ref 0.0–4.4)
Cholesterol, Total: 169 mg/dL (ref 100–199)
HDL: 44 mg/dL (ref >39)
LDL Chol Calc (NIH): 103 mg/dL — ABNORMAL HIGH (ref 0–99)
Triglycerides: 121 mg/dL (ref 0–149)
VLDL Cholesterol Cal: 22 mg/dL (ref 5–40)

## 2021-05-24 LAB — CBC WITH DIFFERENTIAL/PLATELET
Basophils Absolute: 0.1 10*3/uL (ref 0.0–0.2)
Basos: 1 %
EOS (ABSOLUTE): 0.3 10*3/uL (ref 0.0–0.4)
Eos: 3 %
Hematocrit: 40.8 % (ref 34.0–46.6)
Hemoglobin: 13.8 g/dL (ref 11.1–15.9)
Immature Grans (Abs): 0 10*3/uL (ref 0.0–0.1)
Immature Granulocytes: 0 %
Lymphocytes Absolute: 2.5 10*3/uL (ref 0.7–3.1)
Lymphs: 24 %
MCH: 26.9 pg (ref 26.6–33.0)
MCHC: 33.8 g/dL (ref 31.5–35.7)
MCV: 80 fL (ref 79–97)
Monocytes Absolute: 0.5 10*3/uL (ref 0.1–0.9)
Monocytes: 5 %
Neutrophils Absolute: 7 10*3/uL (ref 1.4–7.0)
Neutrophils: 67 %
Platelets: 298 10*3/uL (ref 150–450)
RBC: 5.13 x10E6/uL (ref 3.77–5.28)
RDW: 15 % (ref 11.7–15.4)
WBC: 10.4 10*3/uL (ref 3.4–10.8)

## 2021-05-24 LAB — VITAMIN D 25 HYDROXY (VIT D DEFICIENCY, FRACTURES): Vit D, 25-Hydroxy: 73.3 ng/mL (ref 30.0–100.0)

## 2021-05-25 ENCOUNTER — Encounter (INDEPENDENT_AMBULATORY_CARE_PROVIDER_SITE_OTHER): Payer: Self-pay | Admitting: Family Medicine

## 2021-05-25 MED ORDER — AMOXICILLIN-POT CLAVULANATE 875-125 MG PO TABS: 1.0000 | ORAL_TABLET | Freq: Two times a day (BID) | ORAL | 0 refills | Status: DC

## 2021-06-06 ENCOUNTER — Encounter (INDEPENDENT_AMBULATORY_CARE_PROVIDER_SITE_OTHER): Payer: Self-pay | Admitting: Family Medicine

## 2021-06-16 ENCOUNTER — Ambulatory Visit (INDEPENDENT_AMBULATORY_CARE_PROVIDER_SITE_OTHER): Payer: BC Managed Care – PPO | Admitting: Family Medicine

## 2021-06-16 ENCOUNTER — Other Ambulatory Visit: Payer: Self-pay

## 2021-06-16 ENCOUNTER — Encounter (INDEPENDENT_AMBULATORY_CARE_PROVIDER_SITE_OTHER): Payer: Self-pay | Admitting: Family Medicine

## 2021-06-16 VITALS — BP 112/77 | HR 68 | Temp 97.7°F | Ht 64.0 in | Wt 205.0 lb

## 2021-06-16 DIAGNOSIS — R609 Edema, unspecified: Secondary | ICD-10-CM | POA: Diagnosis not present

## 2021-06-16 DIAGNOSIS — E559 Vitamin D deficiency, unspecified: Secondary | ICD-10-CM | POA: Diagnosis not present

## 2021-06-16 DIAGNOSIS — E1169 Type 2 diabetes mellitus with other specified complication: Secondary | ICD-10-CM | POA: Diagnosis not present

## 2021-06-16 DIAGNOSIS — R632 Polyphagia: Secondary | ICD-10-CM

## 2021-06-16 DIAGNOSIS — Z6841 Body Mass Index (BMI) 40.0 and over, adult: Secondary | ICD-10-CM

## 2021-06-16 MED ORDER — PHENTERMINE HCL 8 MG PO TABS: ORAL_TABLET | ORAL | 0 refills | Status: DC

## 2021-06-16 MED ORDER — WEGOVY 1.7 MG/0.75ML ~~LOC~~ SOAJ: 1.7000 mg | SUBCUTANEOUS | 0 refills | Status: AC

## 2021-06-16 MED ORDER — HYDROCHLOROTHIAZIDE 12.5 MG PO CAPS: 12.5000 mg | ORAL_CAPSULE | Freq: Every day | ORAL | 0 refills | Status: DC

## 2021-06-16 MED ORDER — VITAMIN D (ERGOCALCIFEROL) 1.25 MG (50000 UNIT) PO CAPS
50000.0000 [IU] | ORAL_CAPSULE | ORAL | 0 refills | Status: DC
Start: 2021-06-16 — End: 2021-09-08

## 2021-06-16 NOTE — Progress Notes (Signed)
Chief Complaint:   OBESITY Natasha Lyons is here to discuss her progress with her obesity treatment plan along with follow-up of her obesity related diagnoses. See Medical Weight Management Flowsheet for complete bioelectrical impedance results.  Today's visit was #: 20 Starting weight: 240 lbs Starting date: 11/30/2020 Weight change since last visit: 1 lb Total lbs lost to date: 35 lbs Total weight loss percentage to date: -14.58%  Nutrition Plan: Category 3 Plan for 80-85% of the time.  Activity: Cardio/strength training for 15-20 minutes 5 times per week.  Anti-obesity medications: Wegovy 1.7 mg subcutaneously weekly. Reported side effects: None.  Interim History: Today's bioimpedance results indicate that Natasha Lyons has gained 3 pounds of water weight since her last visit.  She will be seen by Rheumatology next week.  She says she is suffering from chronic fatigue.  Assessment/Plan:   1. Polyphagia Not at goal. Current treatment: None.    Plan:  Start phentermine 8 mg in the morning and at noon.  She will continue to focus on protein-rich, low simple carbohydrate foods. We reviewed the importance of hydration, regular exercise for stress reduction, and restorative sleep.  - Start Phentermine HCl 8 MG TABS; One po in am and around noon.  Dispense: 30 tablet; Refill: 0  2. Vitamin D deficiency At goal. She is taking vitamin D 50,000 IU weekly.  Plan: Continue to take prescription Vitamin D @50 ,000 IU every week as prescribed.  Follow-up for routine testing of Vitamin D, at least 2-3 times per year to avoid over-replacement.  Lab Results  Component Value Date   VD25OH 73.3 05/23/2021   VD25OH 24.6 03/15/2021   VD25OH 38.5 08/31/2020   - Refill Vitamin D, Ergocalciferol, (DRISDOL) 1.25 MG (50000 UNIT) CAPS capsule; Take 1 capsule (50,000 Units total) by mouth every 7 (seven) days.  Dispense: 12 capsule; Refill: 0  3. Edema, unspecified type Natasha Lyons states she has some lower  extremity edema.  Plan:  She will start HCTZ 12.5 mg daily, as per below.  - Start hydrochlorothiazide (MICROZIDE) 12.5 MG capsule; Take 1 capsule (12.5 mg total) by mouth daily.  Dispense: 30 capsule; Refill: 0  4. Type 2 diabetes mellitus with other specified complication, without long-term current use of insulin (HCC) Diabetes Mellitus: Improving, but not optimized. Medication: Wegovy 1.7 mg subcutaneously weekly. Issues reviewed: blood sugar goals, complications of diabetes mellitus, hypoglycemia prevention and treatment, exercise, and nutrition.  Plan: Continue Wegovy 1.7 mg subcutaneously weekly.  The patient will continue to focus on protein-rich, low simple carbohydrate foods. We reviewed the importance of hydration, regular exercise for stress reduction, and restorative sleep.   Lab Results  Component Value Date   HGBA1C 5.8 (H) 05/23/2021   HGBA1C 5.7 (H) 08/31/2020   HGBA1C 6.1 (H) 04/01/2020   Lab Results  Component Value Date   LDLCALC 103 (H) 05/23/2021   CREATININE 0.54 (L) 05/23/2021   - Refill Semaglutide-Weight Management (WEGOVY) 1.7 MG/0.75ML SOAJ; Inject 1.7 mg into the skin once a week.  Dispense: 9 mL; Refill: 0  5. Obesity, current BMI 35.3  Course: Natasha Lyons is currently in the action stage of change. As such, her goal is to continue with weight loss efforts.   Nutrition goals: She has agreed to practicing portion control and making smarter food choices, such as increasing vegetables and decreasing simple carbohydrates.  No gluten, dairy.   Exercise goals:  As is.  Behavioral modification strategies: increasing lean protein intake, decreasing simple carbohydrates, increasing vegetables, and increasing water intake.  Natasha Lyons has agreed to follow-up with our clinic in 4 weeks. She was informed of the importance of frequent follow-up visits to maximize her success with intensive lifestyle modifications for her multiple health conditions.   Objective:   Blood  pressure 112/77, pulse 68, temperature 97.7 F (36.5 C), temperature source Oral, height 5\' 4"  (1.626 m), weight 205 lb (93 kg), last menstrual period 05/05/2019, SpO2 97 %. Body mass index is 35.19 kg/m.  General: Cooperative, alert, well developed, in no acute distress. HEENT: Conjunctivae and lids unremarkable. Cardiovascular: Regular rhythm.  Lungs: Normal work of breathing. Neurologic: No focal deficits.   Lab Results  Component Value Date   CREATININE 0.54 (L) 05/23/2021   BUN 13 05/23/2021   NA 140 05/23/2021   K 4.0 05/23/2021   CL 103 05/23/2021   CO2 23 05/23/2021   Lab Results  Component Value Date   ALT 16 05/23/2021   AST 16 05/23/2021   ALKPHOS 94 05/23/2021   BILITOT 0.3 05/23/2021   Lab Results  Component Value Date   HGBA1C 5.8 (H) 05/23/2021   HGBA1C 5.7 (H) 08/31/2020   HGBA1C 6.1 (H) 04/01/2020   HGBA1C 6.2 (H) 12/01/2019   HGBA1C 6.4 04/20/2017   Lab Results  Component Value Date   INSULIN 13.9 12/01/2019   Lab Results  Component Value Date   TSH 1.44 03/15/2021   Lab Results  Component Value Date   CHOL 169 05/23/2021   HDL 44 05/23/2021   LDLCALC 103 (H) 05/23/2021   TRIG 121 05/23/2021   CHOLHDL 3.8 05/23/2021   Lab Results  Component Value Date   VD25OH 73.3 05/23/2021   VD25OH 24.6 03/15/2021   VD25OH 38.5 08/31/2020   Lab Results  Component Value Date   WBC 10.4 05/23/2021   HGB 13.8 05/23/2021   HCT 40.8 05/23/2021   MCV 80 05/23/2021   PLT 298 05/23/2021   Lab Results  Component Value Date   FERRITIN 41.9 03/15/2021   Attestation Statements:   Reviewed by clinician on day of visit: allergies, medications, problem list, medical history, surgical history, family history, social history, and previous encounter notes.  I, Water quality scientist, CMA, am acting as transcriptionist for Briscoe Deutscher, DO  I have reviewed the above documentation for accuracy and completeness, and I agree with the above. - Briscoe Deutscher, DO, MS,  FAAFP, DABOM - Family and Bariatric Medicine.

## 2021-07-10 ENCOUNTER — Other Ambulatory Visit (INDEPENDENT_AMBULATORY_CARE_PROVIDER_SITE_OTHER): Payer: Self-pay | Admitting: Family Medicine

## 2021-07-10 DIAGNOSIS — R609 Edema, unspecified: Secondary | ICD-10-CM

## 2021-07-12 ENCOUNTER — Encounter (INDEPENDENT_AMBULATORY_CARE_PROVIDER_SITE_OTHER): Payer: Self-pay

## 2021-07-12 NOTE — Telephone Encounter (Signed)
Msg sent to pt 

## 2021-07-15 ENCOUNTER — Other Ambulatory Visit (INDEPENDENT_AMBULATORY_CARE_PROVIDER_SITE_OTHER): Payer: Self-pay | Admitting: Family Medicine

## 2021-07-15 DIAGNOSIS — R632 Polyphagia: Secondary | ICD-10-CM

## 2021-07-15 DIAGNOSIS — R609 Edema, unspecified: Secondary | ICD-10-CM

## 2021-07-18 ENCOUNTER — Encounter (INDEPENDENT_AMBULATORY_CARE_PROVIDER_SITE_OTHER): Payer: Self-pay

## 2021-07-18 NOTE — Telephone Encounter (Signed)
Msg sent to pt 

## 2021-07-18 NOTE — Telephone Encounter (Signed)
LOV w/ Dr. Juleen China

## 2021-07-20 MED ORDER — HYDROCHLOROTHIAZIDE 12.5 MG PO CAPS: 12.5000 mg | ORAL_CAPSULE | Freq: Every day | ORAL | 0 refills | Status: DC

## 2021-08-01 ENCOUNTER — Encounter (INDEPENDENT_AMBULATORY_CARE_PROVIDER_SITE_OTHER): Payer: Self-pay | Admitting: Family Medicine

## 2021-08-01 ENCOUNTER — Other Ambulatory Visit: Payer: Self-pay

## 2021-08-01 ENCOUNTER — Ambulatory Visit (INDEPENDENT_AMBULATORY_CARE_PROVIDER_SITE_OTHER): Payer: BC Managed Care – PPO | Admitting: Family Medicine

## 2021-08-01 VITALS — BP 133/83 | HR 71 | Temp 97.6°F | Ht 64.0 in | Wt 201.0 lb

## 2021-08-01 DIAGNOSIS — S300XXA Contusion of lower back and pelvis, initial encounter: Secondary | ICD-10-CM

## 2021-08-01 DIAGNOSIS — E669 Obesity, unspecified: Secondary | ICD-10-CM | POA: Diagnosis not present

## 2021-08-01 DIAGNOSIS — Z6834 Body mass index (BMI) 34.0-34.9, adult: Secondary | ICD-10-CM

## 2021-08-01 DIAGNOSIS — E1169 Type 2 diabetes mellitus with other specified complication: Secondary | ICD-10-CM | POA: Diagnosis not present

## 2021-08-01 DIAGNOSIS — Z6841 Body Mass Index (BMI) 40.0 and over, adult: Secondary | ICD-10-CM

## 2021-08-01 DIAGNOSIS — L4050 Arthropathic psoriasis, unspecified: Secondary | ICD-10-CM

## 2021-08-01 DIAGNOSIS — Z72820 Sleep deprivation: Secondary | ICD-10-CM

## 2021-08-03 NOTE — Progress Notes (Signed)
Chief Complaint:   OBESITY Natasha Lyons is here to discuss her progress with her obesity treatment plan along with follow-up of her obesity related diagnoses. See Medical Weight Management Flowsheet for complete bioelectrical impedance results.  Today's visit was #: 21 Starting weight: 240 lbs Starting date: 11/30/2020 Weight change since last visit: 4 lbs Total lbs lost to date: 39 lbs Total weight loss percentage to date: -16.25%  Nutrition Plan: Category 3 Plan for 80% of the time.  Activity: Cardio/rowing for 30 minutes 3-4 times per week.  Anti-obesity medications: Wegovy 1.7 mg subcutaneously weekly and phentermine 8 mg twice daily. Reported side effects: None.  Interim History: Natasha Lyons has been using MFP and says it is helping a lot.  She has been watching her protein intake and has increased her water intake.  She says she is still getting in very little dairy, which is helping to decrease joint pain.  She says she fell on some steps and has pain in her tailbone.  Assessment/Plan:   1. Type 2 diabetes mellitus with other specified complication, without long-term current use of insulin (HCC) Diabetes Mellitus: Improving, but not optimized. Medication: Wegovy 1.7 mg subcutaneously weekly. Issues reviewed: blood sugar goals, complications of diabetes mellitus, hypoglycemia prevention and treatment, exercise, and nutrition.  Plan: The patient will continue to focus on protein-rich, low simple carbohydrate foods. We reviewed the importance of hydration, regular exercise for stress reduction, and restorative sleep.   Lab Results  Component Value Date   HGBA1C 5.8 (H) 05/23/2021   HGBA1C 5.7 (H) 08/31/2020   HGBA1C 6.1 (H) 04/01/2020   Lab Results  Component Value Date   LDLCALC 103 (H) 05/23/2021   CREATININE 0.54 (L) 05/23/2021   2. Psoriatic arthritis ([place]) She is followed by Rheumatology.  This issue directly impacts care plan for optimization of BMI and metabolic health as  it impacts the patient's ability to make lifestyle changes. Continue present plan and medications.  3. Contusion of coccyx, initial encounter Will continue to monitor as it pertains to her weight loss journey.  4. 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 Behavior modification techniques were discussed today to help deal with emotional/non-hunger eating behaviors.  5. Obesity, current BMI 34.6  Course: Natasha Lyons is currently in the action stage of change. As such, her goal is to continue with weight loss efforts.   Nutrition goals: She has agreed to the Category 3 Plan.   Exercise goals:  As is.  Behavioral modification strategies: increasing lean protein intake, decreasing simple carbohydrates, increasing vegetables, and increasing water intake.  Natasha Lyons has agreed to follow-up with our clinic in 4-6 weeks. She was informed of the importance of frequent follow-up visits to maximize her success with intensive lifestyle modifications for her multiple health conditions.   Objective:   Blood pressure 133/83, pulse 71, temperature 97.6 F (36.4 C), temperature source Oral, height 5\' 4"  (1.626 m), weight 201 lb (91.2 kg), last menstrual period 05/05/2019, SpO2 95 %. Body mass index is 34.5 kg/m.  General: Cooperative, alert, well developed, in no acute distress. HEENT: Conjunctivae and lids unremarkable. Cardiovascular: Regular rhythm.  Lungs: Normal work of breathing. Neurologic: No focal deficits.   Lab Results  Component Value Date   CREATININE 0.54 (L) 05/23/2021   BUN 13 05/23/2021   NA 140 05/23/2021   K 4.0 05/23/2021   CL 103 05/23/2021   CO2 23 05/23/2021   Lab Results  Component Value Date   ALT 16 05/23/2021  AST 16 05/23/2021   ALKPHOS 94 05/23/2021   BILITOT 0.3 05/23/2021   Lab Results  Component Value Date   HGBA1C 5.8 (H) 05/23/2021   HGBA1C 5.7 (H) 08/31/2020   HGBA1C 6.1 (H) 04/01/2020   HGBA1C 6.2 (H) 12/01/2019   HGBA1C 6.4  04/20/2017   Lab Results  Component Value Date   INSULIN 13.9 12/01/2019   Lab Results  Component Value Date   TSH 1.44 03/15/2021   Lab Results  Component Value Date   CHOL 169 05/23/2021   HDL 44 05/23/2021   LDLCALC 103 (H) 05/23/2021   TRIG 121 05/23/2021   CHOLHDL 3.8 05/23/2021   Lab Results  Component Value Date   VD25OH 73.3 05/23/2021   VD25OH 24.6 03/15/2021   VD25OH 38.5 08/31/2020   Lab Results  Component Value Date   WBC 10.4 05/23/2021   HGB 13.8 05/23/2021   HCT 40.8 05/23/2021   MCV 80 05/23/2021   PLT 298 05/23/2021   Lab Results  Component Value Date   FERRITIN 41.9 03/15/2021   Attestation Statements:   Reviewed by clinician on day of visit: allergies, medications, problem list, medical history, surgical history, family history, social history, and previous encounter notes.  I, Water quality scientist, CMA, am acting as transcriptionist for Briscoe Deutscher, DO  I have reviewed the above documentation for accuracy and completeness, and I agree with the above. -  Briscoe Deutscher, DO, MS, FAAFP, DABOM - Family and Bariatric Medicine.

## 2021-08-14 ENCOUNTER — Other Ambulatory Visit (INDEPENDENT_AMBULATORY_CARE_PROVIDER_SITE_OTHER): Payer: Self-pay | Admitting: Family Medicine

## 2021-08-14 DIAGNOSIS — R609 Edema, unspecified: Secondary | ICD-10-CM

## 2021-08-15 NOTE — Telephone Encounter (Signed)
LOV w/ Wallace

## 2021-08-29 ENCOUNTER — Other Ambulatory Visit (INDEPENDENT_AMBULATORY_CARE_PROVIDER_SITE_OTHER): Payer: Self-pay | Admitting: Family Medicine

## 2021-08-29 DIAGNOSIS — R609 Edema, unspecified: Secondary | ICD-10-CM

## 2021-09-01 ENCOUNTER — Encounter (INDEPENDENT_AMBULATORY_CARE_PROVIDER_SITE_OTHER): Payer: Self-pay | Admitting: Family Medicine

## 2021-09-01 MED ORDER — ONDANSETRON 4 MG PO TBDP: 4.0000 mg | ORAL_TABLET | Freq: Three times a day (TID) | ORAL | 0 refills | Status: DC | PRN

## 2021-09-01 NOTE — Telephone Encounter (Signed)
Pt last seen by Dr. Wallace.  

## 2021-09-02 ENCOUNTER — Other Ambulatory Visit (INDEPENDENT_AMBULATORY_CARE_PROVIDER_SITE_OTHER): Payer: Self-pay | Admitting: Family Medicine

## 2021-09-02 DIAGNOSIS — E559 Vitamin D deficiency, unspecified: Secondary | ICD-10-CM

## 2021-09-05 NOTE — Telephone Encounter (Signed)
LOV w/ Wallace

## 2021-09-07 ENCOUNTER — Encounter (INDEPENDENT_AMBULATORY_CARE_PROVIDER_SITE_OTHER): Payer: Self-pay

## 2021-09-08 ENCOUNTER — Encounter (INDEPENDENT_AMBULATORY_CARE_PROVIDER_SITE_OTHER): Payer: Self-pay | Admitting: Physician Assistant

## 2021-09-08 ENCOUNTER — Encounter: Payer: Self-pay | Admitting: Internal Medicine

## 2021-09-08 ENCOUNTER — Ambulatory Visit (INDEPENDENT_AMBULATORY_CARE_PROVIDER_SITE_OTHER): Payer: BC Managed Care – PPO | Admitting: Family Medicine

## 2021-09-08 ENCOUNTER — Other Ambulatory Visit: Payer: Self-pay

## 2021-09-08 ENCOUNTER — Ambulatory Visit (INDEPENDENT_AMBULATORY_CARE_PROVIDER_SITE_OTHER): Payer: BC Managed Care – PPO | Admitting: Physician Assistant

## 2021-09-08 ENCOUNTER — Other Ambulatory Visit (INDEPENDENT_AMBULATORY_CARE_PROVIDER_SITE_OTHER): Payer: Self-pay | Admitting: Family Medicine

## 2021-09-08 VITALS — BP 123/74 | HR 74 | Temp 97.6°F | Ht 64.0 in | Wt 197.0 lb

## 2021-09-08 DIAGNOSIS — E669 Obesity, unspecified: Secondary | ICD-10-CM | POA: Diagnosis not present

## 2021-09-08 DIAGNOSIS — E559 Vitamin D deficiency, unspecified: Secondary | ICD-10-CM | POA: Diagnosis not present

## 2021-09-08 DIAGNOSIS — R632 Polyphagia: Secondary | ICD-10-CM

## 2021-09-08 DIAGNOSIS — K219 Gastro-esophageal reflux disease without esophagitis: Secondary | ICD-10-CM

## 2021-09-08 DIAGNOSIS — Z9189 Other specified personal risk factors, not elsewhere classified: Secondary | ICD-10-CM

## 2021-09-08 DIAGNOSIS — R609 Edema, unspecified: Secondary | ICD-10-CM

## 2021-09-08 DIAGNOSIS — Z6833 Body mass index (BMI) 33.0-33.9, adult: Secondary | ICD-10-CM

## 2021-09-08 DIAGNOSIS — Z6841 Body Mass Index (BMI) 40.0 and over, adult: Secondary | ICD-10-CM

## 2021-09-08 MED ORDER — VITAMIN D (ERGOCALCIFEROL) 1.25 MG (50000 UNIT) PO CAPS: 50000.0000 [IU] | ORAL_CAPSULE | ORAL | 0 refills | Status: AC

## 2021-09-08 MED ORDER — PANTOPRAZOLE SODIUM 40 MG PO TBEC: 40.0000 mg | DELAYED_RELEASE_TABLET | Freq: Every morning | ORAL | 3 refills | Status: DC

## 2021-09-08 MED ORDER — HYDROCHLOROTHIAZIDE 12.5 MG PO CAPS: 12.5000 mg | ORAL_CAPSULE | Freq: Every day | ORAL | 0 refills | Status: DC

## 2021-09-08 NOTE — Telephone Encounter (Signed)
Natasha Lyons 

## 2021-09-08 NOTE — Progress Notes (Signed)
? ? ? ?Chief Complaint:  ? ?OBESITY ?Natasha Lyons is here to discuss her progress with her obesity treatment plan along with follow-up of her obesity related diagnoses. Natasha Lyons is on the Category 3 Plan and states she is following her eating plan approximately 50% of the time. Natasha Lyons states she is using the exercise bike for 30 minutes 3 times per week. ? ?Today's visit was #: 64 ?Starting weight: 240 lbs ?Starting date: 11/30/2020 ?Today's weight: 197 lbs ?Today's date: 09/08/2021 ?Total lbs lost to date: 43 lbs ?Total lbs lost since last in-office visit: 4 lbs ? ?Interim History: Natasha Lyons has had an issue with vomiting 3 times and diarrhea for the last 2 weeks. She has a history of reflux and currently taking Prilosec which is not helping. She notes no black stools or coffee ground emesis. She has no appetite. She stopped Wegovy on her own last week. Her last colonoscopy and endoscopy was 7 years ago and it was normal.  ? ?Subjective:  ? ?1. Vitamin D deficiency ?Natasha Lyons is taking her medications as prescribed.  ? ?2. Edema, unspecified type ?Natasha Lyons is taking HCTZ currently. She notes no complaints today. ? ?3. Chronic GERD ?Natasha Lyons reports GERD symptoms for years but worse the last 2 weeks.  ? ?4. At risk for deficient intake of food ?The patient is at a higher than average risk of deficient intake of food due to inability to tolerate regular food at this time.  ? ?Assessment/Plan:  ? ?1. Vitamin D deficiency ?Low Vitamin D level contributes to fatigue and are associated with obesity, breast, and colon cancer. We will refill  prescription Vitamin D 50,000 IU every week for 1 month with no refills and Natasha Lyons will follow-up for routine testing of Vitamin D, at least 2-3 times per year to avoid over-replacement. ? ?- Vitamin D, Ergocalciferol, (DRISDOL) 1.25 MG (50000 UNIT) CAPS capsule; Take 1 capsule (50,000 Units total) by mouth every 7 (seven) days.  Dispense: 12 capsule; Refill: 0 ? ?2. Edema, unspecified type ?We will  refill HCTZ for 1 month with no refills. ?- hydrochlorothiazide (MICROZIDE) 12.5 MG capsule; Take 1 capsule (12.5 mg total) by mouth daily.  Dispense: 30 capsule; Refill: 0 ? ?3. Chronic GERD ?Natasha Lyons will discontinue Omeprazole. She agrees to start Protonix 40 mg for 1 month with no refills. Intensive lifestyle modifications are the first line treatment for this issue. We discussed several lifestyle modifications today and she will continue to work on diet, exercise and weight loss efforts. Orders and follow up as documented in patient record.  ? ?Counseling ?If a person has gastroesophageal reflux disease (GERD), food and stomach acid move back up into the esophagus and cause symptoms or problems such as damage to the esophagus. ?Anti-reflux measures include: raising the head of the bed, avoiding tight clothing or belts, avoiding eating late at night, not lying down shortly after mealtime, and achieving weight loss. ?Avoid ASA, NSAID's, caffeine, alcohol, and tobacco.  ?OTC Pepcid and/or Tums are often very helpful for as needed use.  ?However, for persisting chronic or daily symptoms, stronger medications like Omeprazole may be needed. ?You may need to avoid foods and drinks such as: ?Coffee and tea (with or without caffeine). ?Drinks that contain alcohol. ?Energy drinks and sports drinks. ?Bubbly (carbonated) drinks or sodas. ?Chocolate and cocoa. ?Peppermint and mint flavorings. ?Garlic and onions. ?Horseradish. ?Spicy and acidic foods. These include peppers, chili powder, curry powder, vinegar, hot sauces, and BBQ sauce. ?Citrus fruit juices and citrus fruits, such as oranges,  lemons, and limes. ?Tomato-based foods. These include red sauce, chili, salsa, and pizza with red sauce. ?Fried and fatty foods. These include donuts, french fries, potato chips, and high-fat dressings. ?High-fat meats. These include hot dogs, rib eye steak, sausage, ham, and bacon.  ?- pantoprazole (PROTONIX) 40 MG tablet; Take 1 tablet  (40 mg total) by mouth every morning.  Dispense: 30 tablet; Refill: 3 ? ?4. At risk for deficient intake of food ?Natasha Lyons is at risk for deficient intake of food due to inability to tolerate regular food. Natasha Lyons was given approximately 15 minutes of deficient intake of food prevention counseling today. Natasha Lyons is at risk for eating too few calories based on current food recall. She was encouraged to focus on meeting caloric and protein goals according to her recommended meal plan.  ? ?5. Obesity, current BMI 33.8 ?Natasha Lyons is currently in the action stage of change. As such, her goal is to continue with weight loss efforts. She has agreed to the Category 3 Plan.  ? ?Natasha Lyons will stop Wegovy until she sees gastroenterologist. She will stop Lomaira until she sees gastroenterologist. She was referred to gastroenterologist. She will eat a banana, rice, applesauce and toast diet. She will supplement with protein shakes.  ? ?Exercise goals:  As is. ? ?Behavioral modification strategies: increasing lean protein intake and meal planning and cooking strategies. ? ?Natasha Lyons has agreed to follow-up with our clinic in 2 weeks. She was informed of the importance of frequent follow-up visits to maximize her success with intensive lifestyle modifications for her multiple health conditions.  ? ?Objective:  ? ?Blood pressure 123/74, pulse 74, temperature 97.6 ?F (36.4 ?C), height 5\' 4"  (1.626 m), weight 197 lb (89.4 kg), last menstrual period 05/05/2019, SpO2 95 %. ?Body mass index is 33.81 kg/m?. ? ?General: Cooperative, alert, well developed, in no acute distress. ?HEENT: Conjunctivae and lids unremarkable. ?Cardiovascular: Regular rhythm.  ?Lungs: Normal work of breathing. ?Neurologic: No focal deficits.  ? ?Lab Results  ?Component Value Date  ? CREATININE 0.54 (L) 05/23/2021  ? BUN 13 05/23/2021  ? NA 140 05/23/2021  ? K 4.0 05/23/2021  ? CL 103 05/23/2021  ? CO2 23 05/23/2021  ? ?Lab Results  ?Component Value Date  ? ALT 16 05/23/2021   ? AST 16 05/23/2021  ? ALKPHOS 94 05/23/2021  ? BILITOT 0.3 05/23/2021  ? ?Lab Results  ?Component Value Date  ? HGBA1C 5.8 (H) 05/23/2021  ? HGBA1C 5.7 (H) 08/31/2020  ? HGBA1C 6.1 (H) 04/01/2020  ? HGBA1C 6.2 (H) 12/01/2019  ? HGBA1C 6.4 04/20/2017  ? ?Lab Results  ?Component Value Date  ? INSULIN 13.9 12/01/2019  ? ?Lab Results  ?Component Value Date  ? TSH 1.44 03/15/2021  ? ?Lab Results  ?Component Value Date  ? CHOL 169 05/23/2021  ? HDL 44 05/23/2021  ? LDLCALC 103 (H) 05/23/2021  ? TRIG 121 05/23/2021  ? CHOLHDL 3.8 05/23/2021  ? ?Lab Results  ?Component Value Date  ? VD25OH 73.3 05/23/2021  ? VD25OH 24.6 03/15/2021  ? VD25OH 38.5 08/31/2020  ? ?Lab Results  ?Component Value Date  ? WBC 10.4 05/23/2021  ? HGB 13.8 05/23/2021  ? HCT 40.8 05/23/2021  ? MCV 80 05/23/2021  ? PLT 298 05/23/2021  ? ?Lab Results  ?Component Value Date  ? FERRITIN 41.9 03/15/2021  ? ?Attestation Statements:  ? ?Reviewed by clinician on day of visit: allergies, medications, problem list, medical history, surgical history, family history, social history, and previous encounter notes. ? ?I, Tonye Pearson,  am acting as Location manager for [employer], PA-C. ? ?I have reviewed the above documentation for accuracy and completeness, and I agree with the above. Abby Potash, PA-C ? ?

## 2021-09-12 ENCOUNTER — Encounter (INDEPENDENT_AMBULATORY_CARE_PROVIDER_SITE_OTHER): Payer: Self-pay | Admitting: Physician Assistant

## 2021-09-12 NOTE — Telephone Encounter (Signed)
LAST APPOINTMENT DATE: 09/08/21 ?NEXT APPOINTMENT DATE: 09/19/21 ? ? ?[id number] [place] [facility] [address]?Phone: [phone]: [phone]? ?[place]?Phone: [phone]: [phone]? ?Patient is requesting a refill of the following medications: ?Requested Prescriptions  ? ?Pending Prescriptions Disp Refills  ? [place]8 MG TABS [Pharmacy Med Name: LOMAIRA 8 MG TABLET] 30 tablet 0  ?  Sig: TAKE 1 TABLET BY MOUTH EVERY MORNING AND TAKE 1 TABLET BY MOUTH AROUND NOON  ? ?Refused Prescriptions Disp Refills  ? Vitamin D, Ergocalciferol, (DRISDOL) 1.25 MG (50000 UNIT) CAPS capsule [Pharmacy Med Name: VITAMIN D2 1.'25MG'$ (50,000 UNIT)] 12 capsule 0  ?  Sig: TAKE 1 CAPSULE (50,000 UNITS TOTAL) BY MOUTH EVERY 7 (SEVEN) DAYS  ?  Refused By: SDavy PiqueL  ?  Reason for Refusal: Request already responded to by other means (e.g. phone or fax)  ? ? ?Date last filled: 07/20/21 ?Previously prescribed by Dr WJuleen China? ?Lab Results  ?Component Value Date  ? HGBA1C 5.8 (H) 05/23/2021  ? HGBA1C 5.7 (H) 08/31/2020  ? HGBA1C 6.1 (H) 04/01/2020  ? ?Lab Results  ?Component Value Date  ? LDLCALC 103 (H) 05/23/2021  ? CREATININE 0.54 (L) 05/23/2021  ? ?Lab Results  ?Component Value Date  ? VD25OH 73.3 05/23/2021  ? VD25OH 24.6 03/15/2021  ? VD25OH 38.5 08/31/2020  ? ? ?BP Readings from Last 3 Encounters:  ?09/08/21 123/74  ?08/01/21 133/83  ?06/16/21 112/77  ? ? ?

## 2021-09-12 NOTE — Telephone Encounter (Signed)
Tracey 

## 2021-09-12 NOTE — Telephone Encounter (Signed)
Natasha Lyons 

## 2021-09-19 ENCOUNTER — Ambulatory Visit (INDEPENDENT_AMBULATORY_CARE_PROVIDER_SITE_OTHER): Payer: BC Managed Care – PPO | Admitting: Family Medicine

## 2021-09-19 ENCOUNTER — Other Ambulatory Visit: Payer: Self-pay

## 2021-09-19 ENCOUNTER — Encounter (INDEPENDENT_AMBULATORY_CARE_PROVIDER_SITE_OTHER): Payer: Self-pay | Admitting: Family Medicine

## 2021-09-19 VITALS — BP 129/77 | HR 74 | Temp 97.5°F | Ht 64.0 in | Wt 201.0 lb

## 2021-09-19 DIAGNOSIS — E669 Obesity, unspecified: Secondary | ICD-10-CM | POA: Diagnosis not present

## 2021-09-19 DIAGNOSIS — I10 Essential (primary) hypertension: Secondary | ICD-10-CM

## 2021-09-19 DIAGNOSIS — R632 Polyphagia: Secondary | ICD-10-CM | POA: Diagnosis not present

## 2021-09-19 DIAGNOSIS — Z6834 Body mass index (BMI) 34.0-34.9, adult: Secondary | ICD-10-CM

## 2021-09-19 DIAGNOSIS — L4050 Arthropathic psoriasis, unspecified: Secondary | ICD-10-CM | POA: Diagnosis not present

## 2021-09-20 MED ORDER — WEGOVY 0.5 MG/0.5ML ~~LOC~~ SOAJ: 0.5000 mg | SUBCUTANEOUS | 1 refills | Status: DC

## 2021-09-20 MED ORDER — LOMAIRA 8 MG PO TABS: ORAL_TABLET | ORAL | 0 refills | Status: DC

## 2021-09-21 ENCOUNTER — Encounter (INDEPENDENT_AMBULATORY_CARE_PROVIDER_SITE_OTHER): Payer: Self-pay

## 2021-09-21 ENCOUNTER — Other Ambulatory Visit: Payer: Self-pay

## 2021-09-21 ENCOUNTER — Telehealth (INDEPENDENT_AMBULATORY_CARE_PROVIDER_SITE_OTHER): Payer: Self-pay | Admitting: Family Medicine

## 2021-09-21 ENCOUNTER — Encounter: Payer: Self-pay | Admitting: Emergency Medicine

## 2021-09-21 ENCOUNTER — Ambulatory Visit: Payer: BC Managed Care – PPO | Admitting: Emergency Medicine

## 2021-09-21 VITALS — BP 118/82 | HR 74 | Ht 64.0 in | Wt 200.0 lb

## 2021-09-21 DIAGNOSIS — Z7689 Persons encountering health services in other specified circumstances: Secondary | ICD-10-CM

## 2021-09-21 DIAGNOSIS — I10 Essential (primary) hypertension: Secondary | ICD-10-CM | POA: Diagnosis not present

## 2021-09-21 DIAGNOSIS — R1310 Dysphagia, unspecified: Secondary | ICD-10-CM | POA: Insufficient documentation

## 2021-09-21 DIAGNOSIS — K5904 Chronic idiopathic constipation: Secondary | ICD-10-CM | POA: Insufficient documentation

## 2021-09-21 DIAGNOSIS — Z1211 Encounter for screening for malignant neoplasm of colon: Secondary | ICD-10-CM

## 2021-09-21 DIAGNOSIS — K219 Gastro-esophageal reflux disease without esophagitis: Secondary | ICD-10-CM

## 2021-09-21 DIAGNOSIS — J4520 Mild intermittent asthma, uncomplicated: Secondary | ICD-10-CM | POA: Diagnosis not present

## 2021-09-21 DIAGNOSIS — L4050 Arthropathic psoriasis, unspecified: Secondary | ICD-10-CM

## 2021-09-21 DIAGNOSIS — E039 Hypothyroidism, unspecified: Secondary | ICD-10-CM

## 2021-09-21 DIAGNOSIS — M159 Polyosteoarthritis, unspecified: Secondary | ICD-10-CM

## 2021-09-21 DIAGNOSIS — R7303 Prediabetes: Secondary | ICD-10-CM

## 2021-09-21 DIAGNOSIS — G894 Chronic pain syndrome: Secondary | ICD-10-CM

## 2021-09-21 NOTE — Assessment & Plan Note (Signed)
Stable and well-controlled.  Sees rheumatologist on a regular basis. ?Continue present medications as handled by rheumatologist. ?

## 2021-09-21 NOTE — Assessment & Plan Note (Signed)
Contributing to chronic pain.  NSAIDs as needed. ?

## 2021-09-21 NOTE — Assessment & Plan Note (Signed)
Stable.  Continue Protonix 40 mg daily

## 2021-09-21 NOTE — Assessment & Plan Note (Signed)
Well-controlled.  Continue amlodipine 5 mg daily ?BP Readings from Last 3 Encounters:  ?09/21/21 118/82  ?09/19/21 129/77  ?09/08/21 123/74  ? ? ?

## 2021-09-21 NOTE — Assessment & Plan Note (Signed)
Clinically euthyroid.  Continue thyroid medication. ?

## 2021-09-21 NOTE — Progress Notes (Signed)
Chief Complaint:   OBESITY Natasha Lyons is here to discuss her progress with her obesity treatment plan along with follow-up of her obesity related diagnoses. See Medical Weight Management Flowsheet for complete bioelectrical impedance results.  Today's visit was #: 23 Starting weight: 240 lbs Starting date: 11/30/2020 Weight change since last visit: +4 lbs Total lbs lost to date: 39 lbs Total weight loss percentage to date: -16.25%  Nutrition Plan: Category 3 Plan for 75-80% of the time.  Activity: Cardio/walking for 30-45 minutes 4 times per week.  Interim History: [name] had a GI illness for 2-3 weeks.  She stopped Wegovy 1.7 mg and phentermine 8 mg.  She says she started taking a probiotic/prebiotic.  Assessment/Plan:   1. Polyphagia Controlled. Current treatment: Wegovy 1.7 mg subcutaneously weekly and phentermine 8 mg daily.    Plan: Decrease Wegovy to 0.5 mg subcutaneously weekly (decreased dose due to recent GI illness).  Continue/restart phentermine 8 mg daily.  She will continue to focus on protein-rich, low simple carbohydrate foods. We reviewed the importance of hydration, regular exercise for stress reduction, and restorative sleep.  - Decrease Semaglutide-Weight Management (WEGOVY) 0.5 MG/0.5ML SOAJ; Inject 0.5 mg into the skin once a week.  Dispense: 2 mL; Refill: 1 - Refill Phentermine HCl (LOMAIRA) 8 MG TABS; TAKE 1 TABLET BY MOUTH EVERY MORNING AND TAKE 1 TABLET BY MOUTH AROUND NOON  Dispense: 30 tablet; Refill: 0  Having again reminded the patient of the "off label" use of Phentermine beyond three consecutive months, and again discussing the risks, benefits, contraindications, and limitations of it's use; given it's role in the successful treatment of obesity thus far and lack of adverse effect, patient has expressed desire and given informed verbal consent to continue use.   I have consulted the [employer] Controlled Substances Registry for this patient, and feel the  risk/benefit ratio today is favorable for proceeding with this prescription for a controlled substance. The patient understands monitoring parameters and red flags.   2. Psoriatic arthritis ([place]) She is followed by Rheumatology.  This issue directly impacts care plan for optimization of BMI and metabolic health as it impacts the patient's ability to make lifestyle changes. Continue present plan and medications.  3. Essential hypertension At goal. Medications: Norvasc 5 mg daily, HCTZ 12.5 mg daily.   Plan: Avoid buying foods that are: processed, frozen, or prepackaged to avoid excess salt. We will watch for signs of hypotension as she continues lifestyle modifications.  BP Readings from Last 3 Encounters:  09/19/21 129/77  09/08/21 123/74  08/01/21 133/83   Lab Results  Component Value Date   CREATININE 0.54 (L) 05/23/2021   4. Obesity, current BMI 34.6  Course: Natasha Lyons is currently in the action stage of change. As such, her goal is to continue with weight loss efforts.   Nutrition goals: She has agreed to the Category 3 Plan.   Exercise goals:  As is.  Behavioral modification strategies: increasing lean protein intake, decreasing simple carbohydrates, increasing vegetables, and increasing water intake.  Natasha Lyons has agreed to follow-up with our clinic in 4 weeks. She was informed of the importance of frequent follow-up visits to maximize her success with intensive lifestyle modifications for her multiple health conditions.   Objective:   Blood pressure 129/77, pulse 74, temperature (!) 97.5 F (36.4 C), temperature source Oral, height '5\' 4"'$  (1.626 m), weight 201 lb (91.2 kg), last menstrual period 05/05/2019, SpO2 95 %. Body mass index is 34.5 kg/m.  General: Cooperative, alert, well  developed, in no acute distress. HEENT: Conjunctivae and lids unremarkable. Cardiovascular: Regular rhythm.  Lungs: Normal work of breathing. Neurologic: No focal deficits.   Lab Results   Component Value Date   CREATININE 0.54 (L) 05/23/2021   BUN 13 05/23/2021   NA 140 05/23/2021   K 4.0 05/23/2021   CL 103 05/23/2021   CO2 23 05/23/2021   Lab Results  Component Value Date   ALT 16 05/23/2021   AST 16 05/23/2021   ALKPHOS 94 05/23/2021   BILITOT 0.3 05/23/2021   Lab Results  Component Value Date   HGBA1C 5.8 (H) 05/23/2021   HGBA1C 5.7 (H) 08/31/2020   HGBA1C 6.1 (H) 04/01/2020   HGBA1C 6.2 (H) 12/01/2019   HGBA1C 6.4 04/20/2017   Lab Results  Component Value Date   INSULIN 13.9 12/01/2019   Lab Results  Component Value Date   TSH 1.44 03/15/2021   Lab Results  Component Value Date   CHOL 169 05/23/2021   HDL 44 05/23/2021   LDLCALC 103 (H) 05/23/2021   TRIG 121 05/23/2021   CHOLHDL 3.8 05/23/2021   Lab Results  Component Value Date   VD25OH 73.3 05/23/2021   VD25OH 24.6 03/15/2021   VD25OH 38.5 08/31/2020   Lab Results  Component Value Date   WBC 10.4 05/23/2021   HGB 13.8 05/23/2021   HCT 40.8 05/23/2021   MCV 80 05/23/2021   PLT 298 05/23/2021   Lab Results  Component Value Date   FERRITIN 41.9 03/15/2021   Attestation Statements:   Reviewed by clinician on day of visit: allergies, medications, problem list, medical history, surgical history, family history, social history, and previous encounter notes.  I, Water quality scientist, CMA, am acting as transcriptionist for Briscoe Deutscher, DO  I have reviewed the above documentation for accuracy and completeness, and I agree with the above. -  Briscoe Deutscher, DO, MS, FAAFP, DABOM - Family and Bariatric Medicine.

## 2021-09-21 NOTE — Progress Notes (Signed)
Natasha Lyons 61 y.o.   Chief Complaint  Patient presents with   New Patient (Initial Visit)    Needing to change provider, alternative medication for albuterol solution, pharmacy is out    HISTORY OF PRESENT ILLNESS: This is a 61 y.o. female first visit to this office, here to establish care with me. Has the following chronic medical problems: 1.  Psoriatic arthritis.  Sees rheumatologist on a regular basis.  On multiple medications handled by him. 2.  Essential hypertension: On amlodipine 5 mg daily 3.  Obesity: Under care of medical weight management group 4.  History of asthma.  Stable.  Does not need to use albuterol on a regular basis.  Uses Wixela inhaler daily 5.  Hyper thyroidism: On Armour Thyroid 50 mg 6.  Osteoarthritis and chronic pain.  Uses Tylenol and or NSAIDs as needed 7.  Prediabetes 8.  GERD: Takes Protonix 40 mg daily. No other complaints or medical concerns today.  HPI   Prior to Admission medications   Medication Sig Start Date End Date Taking? Authorizing Provider  Acetaminophen (TYLENOL 8 HOUR PO) Take by mouth.   Yes [provider]  albuterol (PROVENTIL) (2.5 MG/3ML) 0.083% nebulizer solution Take 2.5 mg by nebulization every 6 (six) hours as needed for wheezing or shortness of breath.   Yes [provider]  albuterol (VENTOLIN HFA) 108 (90 Base) MCG/ACT inhaler Inhale 2 puffs into the lungs every 6 (six) hours as needed for wheezing.   Yes [provider]  amLODipine (NORVASC) 5 MG tablet Take 5 mg by mouth daily.   Yes [provider]  ARMOUR THYROID 15 MG tablet Take 15 mg by mouth daily. 10/28/19  Yes [provider]  BD PEN NEEDLE NANO 2ND GEN 32G X 4 MM MISC USE ONCE A WEEK 04/26/20  Yes Helane Rima, DO  cetirizine (ZYRTEC) 10 MG tablet Take 10 mg by mouth daily.   Yes [provider]  clobetasol ointment (TEMOVATE) 0.05 % Apply 1 application topically 2 (two) times daily. 08/11/19  Yes  [provider]  COLLAGEN PO Take 1 tablet by mouth daily at 6 (six) AM.   Yes [provider]  estradiol (ESTRACE) 0.1 MG/GM vaginal cream 1 gram vaginally qhs x 1 week, then change to twice weekly. Use a pea sized amount externally at the same time. 03/07/21  Yes Romualdo Bolk, MD  fluticasone Aleda Grana) 50 MCG/ACT nasal spray  05/12/18  Yes [provider]  folic acid (FOLVITE) 800 MCG tablet Take 800 mcg by mouth daily.    Yes [provider]  hydrochlorothiazide (MICROZIDE) 12.5 MG capsule Take 1 capsule (12.5 mg total) by mouth daily. 09/08/21  Yes Alois Cliche, PA-C  methotrexate (RHEUMATREX) 2.5 MG tablet Take 20 mg by mouth every Sunday. Caution:Chemotherapy. Protect from light.   Yes [provider]  montelukast (SINGULAIR) 10 MG tablet Take 10 mg by mouth at bedtime.   Yes [provider]  naproxen (NAPROSYN) 500 MG tablet Take 1 tablet (500 mg total) by mouth 2 (two) times daily. 08/08/17  Yes Rancour, Jeannett Senior, MD  NONFORMULARY OR COMPOUNDED ITEM Testosterone 1%, apply 0.5 grams to the lower abdomen daily. 03/15/21  Yes Romualdo Bolk, MD  pantoprazole (PROTONIX) 40 MG tablet Take 1 tablet (40 mg total) by mouth every morning. 09/08/21  Yes Alois Cliche, PA-C  Phentermine HCl (LOMAIRA) 8 MG TABS TAKE 1 TABLET BY MOUTH EVERY MORNING AND TAKE 1 TABLET BY MOUTH AROUND NOON 09/20/21  Yes Helane Rima, DO  Secukinumab 150 MG/ML SOSY Inject 300 mg into the skin every 30 (thirty) days.   Yes [provider]  Semaglutide-Weight Management (WEGOVY) 0.5 MG/0.5ML SOAJ Inject 0.5 mg into the skin once a week. 09/20/21  Yes Helane Rima, DO  VITAMIN D, CHOLECALCIFEROL, PO Take 1 tablet by mouth daily at 6 (six) AM.   Yes [provider]  Vitamin D, Ergocalciferol, (DRISDOL) 1.25 MG (50000 UNIT) CAPS capsule Take 1 capsule (50,000 Units total) by mouth every 7 (seven) days. 09/08/21  Yes Alois Cliche, PA-C  WIXELA INHUB  250-50 MCG/DOSE AEPB Inhale 1 puff into the lungs 2 (two) times daily. 10/10/19  Yes [provider]  blood glucose meter kit and supplies KIT Dispense based on patient and insurance preference. Use up to four times daily as directed. Patient not taking: Reported on 09/21/2021 03/16/21   Whitmire, Thermon Leyland, FNP    Allergies  Allergen Reactions   Dust Mite Extract Shortness Of Breath   Grass Extracts [Gramineae Pollens] Shortness Of Breath   Tree Extract Shortness Of Breath    Certain tree allergies    Patient Active Problem List   Diagnosis Date Noted   Chronic idiopathic constipation 09/21/2021   Gastro-esophageal reflux disease without esophagitis 09/21/2021   Cervical spondylosis without myelopathy 03/07/2021   Joint pain 03/07/2021   Osteoarthritis 03/07/2021   Fibromyalgia 03/07/2021   H/O total knee replacement, 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   Depression 04/05/2020   Other specified anxiety disorders 04/05/2020   Chronic pain of right knee 04/05/2020   Diabetes mellitus (HCC) 02/09/2020   History of endometriosis 02/06/2020   Osteoporosis 01/06/2020   Nonallopathic lesion of cervical region 04/24/2016   Nonallopathic lesion of rib cage 04/24/2016   SI (sacroiliac) joint dysfunction 03/21/2016   Nonallopathic lesion of sacral region 03/21/2016   Nonallopathic lesion of lumbosacral region 03/21/2016   Nonallopathic lesion of thoracic region 03/21/2016   Rheumatoid bursitis, 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/2015   Psoriatic arthritis (HCC) 04/07/2015   Chronic pain syndrome 04/07/2015   Psoriasis 08/10/2013   Vitamin D deficiency 08/10/2013   Thyroid nodule 05/02/2013   Essential hypertension 03/06/2013   Acquired hypothyroidism 03/06/2013   Extrinsic asthma, mild, intermittent 03/06/2013   Class 2 severe obesity with serious comorbidity and body mass index (BMI) of 36.0 to 36.9 in adult [facility]) 03/06/2013    Past Medical History:  Diagnosis Date   Allergy    takes Zyrtec daily    Anemia    Anxiety    Arthritis    psorriatric arthritis   Asthma    Albuterol as needed as well as neb as needed   Back pain    Bilateral swelling of feet    Chronic back pain    pinched nerve   Constipation    Cough    Depression    Family history of adverse reaction to anesthesia    pts dad gets sick with anesthesia   Fibromyalgia    takes Effexor daily   Fibromyalgia    Gallbladder problem    GERD (gastroesophageal reflux disease)    Headache    seasonal    History of bronchitis 2016   History of shingles    Hypertension    takes Amlodipine daily   Hypothyroid    Infertility, female    Joint pain    Joint pain    Joint swelling    Muscle spasm    takes Robaxin as needed   Osteoarthritis  Osteoarthritis    Palpitations    Pneumonia    hx of-10+ yrs ago   Prediabetes    Psoriasis    Psoriatic arthritis (HCC)    Shortness of breath    Vitamin D deficiency     Past Surgical History:  Procedure Laterality Date   APPENDECTOMY     BIOPSY THYROID  2014   results benign   CHOLECYSTECTOMY  2008   DIAGNOSTIC LAPAROSCOPY     d/c endometriosis   DILATION AND CURETTAGE OF UTERUS     INSERTION OF MESH N/A 06/15/2016   Procedure: INSERTION OF MESH;  Surgeon: Chevis Pretty III, MD;  Location: [facility] OR;  Service: General;  Laterality: N/A;   KNEE ARTHROSCOPY Bilateral 2011   MASS EXCISION Right 04/21/2014   Procedure: EXCISION OF RIGHT BACK MASS;  Surgeon: Abigail Miyamoto, MD;  Location: [facility];  Service: General;  Laterality: Right;   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   TONSILLECTOMY     VENTRAL HERNIA REPAIR  06/15/2016   VENTRAL HERNIA REPAIR N/A 06/15/2016   Procedure: LAPAROSCOPIC VENTRAL HERNIA WITH MESH;  Surgeon: Chevis Pretty III, MD;  Location: [facility] OR;  Service: General;  Laterality: N/A;    Social History   Socioeconomic History   Marital status: Married    Spouse name: Not on file   Number of children: 0   Years of education: Not on  file   Highest education level: Not on file  Occupational History   Not on file  Tobacco Use   Smoking status: Never   Smokeless tobacco: Never  Vaping Use   Vaping Use: Never used  Substance and Sexual Activity   Alcohol use: Yes    Comment: occ   Drug use: No   Sexual activity: Not Currently    Birth control/protection: Post-menopausal  Other Topics Concern   Not on file  Social History Narrative   Not on file   Social Determinants of Health   Financial Resource Strain: Not on file  Food Insecurity: Not on file  Transportation Needs: Not on file  Physical Activity: Not on file  Stress: Not on file  Social Connections: Not on file  Intimate Partner Violence: Not on file    Family History  Problem Relation Age of Onset   Diabetes Mother    Hypertension Mother    Hyperlipidemia Mother    Heart disease Mother    Stroke Mother    Kidney disease Mother    Thyroid disease Mother    Obesity Mother    Hypertension Father      Review of Systems  Constitutional: Negative.  Negative for chills and fever.  HENT: Negative.  Negative for congestion and sore throat.   Respiratory: Negative.  Negative for cough and shortness of breath.   Cardiovascular: Negative.  Negative for chest pain and palpitations.  Gastrointestinal: Negative.  Negative for abdominal pain, diarrhea, nausea and vomiting.  Genitourinary: Negative.  Negative for dysuria and hematuria.  Musculoskeletal:  Positive for joint pain.  Skin: Negative.  Negative for rash.  Neurological: Negative.  Negative for dizziness and headaches.  All other systems reviewed and are negative.  Today's Vitals   09/21/21 1521  BP: 118/82  Pulse: 74  SpO2: 95%  Weight: 200 lb (90.7 kg)  Height: 5\' 4"  (1.626 m)   Body mass index is 34.33 kg/m.  Physical Exam Vitals reviewed.  Constitutional:      Appearance: Normal appearance.  HENT:     Head:  Normocephalic.     Mouth/Throat:     Mouth: Mucous membranes are  moist.     Pharynx: Oropharynx is clear.  Eyes:     Extraocular Movements: Extraocular movements intact.     Conjunctiva/sclera: Conjunctivae normal.     Pupils: Pupils are equal, round, and reactive to light.  Cardiovascular:     Rate and Rhythm: Normal rate and regular rhythm.     Pulses: Normal pulses.     Heart sounds: Normal heart sounds.  Pulmonary:     Effort: Pulmonary effort is normal.     Breath sounds: Normal breath sounds.  Abdominal:     General: There is no distension.     Palpations: Abdomen is soft.     Tenderness: There is no abdominal tenderness.  Musculoskeletal:     Cervical back: No tenderness.     Right lower leg: No edema.     Left lower leg: No edema.  Lymphadenopathy:     Cervical: No cervical adenopathy.  Skin:    General: Skin is warm and dry.     Capillary Refill: Capillary refill takes less than 2 seconds.  Neurological:     General: No focal deficit present.     Mental Status: She is alert and oriented to person, place, and time.  Psychiatric:        Mood and Affect: Mood normal.        Behavior: Behavior normal.     ASSESSMENT & PLAN: A total of 51 minutes was spent with the patient and counseling/coordination of care regarding preparing for this visit, establishing care with me, review of available medical records, review of multiple chronic medical conditions and their management, review of all medications, review of most recent blood work results, comprehensive history and physical exam, prognosis, documentation and need for follow-up.  Problem List Items Addressed This Visit       Cardiovascular and Mediastinum   Essential hypertension    Well-controlled.  Continue amlodipine 5 mg daily BP Readings from Last 3 Encounters:  09/21/21 118/82  09/19/21 129/77  09/08/21 123/74           Respiratory   Extrinsic asthma, mild, intermittent    Stable and well-controlled on daily Wixela.  Only uses rescue inhaler sparingly.         Digestive   Gastro-esophageal reflux disease without esophagitis    Stable.  Continue Protonix 40 mg daily.        Endocrine   Acquired hypothyroidism    Clinically euthyroid.  Continue thyroid medication.        Musculoskeletal and Integument   Psoriatic arthritis (HCC) - Primary    Stable and well-controlled.  Sees rheumatologist on a regular basis. Continue present medications as handled by rheumatologist.      Osteoarthritis    Contributing to chronic pain.  NSAIDs as needed.        Other   Chronic pain syndrome   Other Visit Diagnoses     Prediabetes       Encounter to establish care       Colon cancer screening       Relevant Orders   Cologuard      Patient Instructions  Health Maintenance, Female Adopting a healthy lifestyle and getting preventive care are important in promoting health and wellness. Ask your health care provider about: The right schedule for you to have regular tests and exams. Things you can do on your own to prevent diseases and keep yourself  healthy. What should I know about diet, weight, and exercise? Eat a healthy diet  Eat a diet that includes plenty of vegetables, fruits, low-fat dairy products, and lean protein. Do not eat a lot of foods that are high in solid fats, added sugars, or sodium. Maintain a healthy weight Body mass index (BMI) is used to identify weight problems. It estimates body fat based on height and weight. Your health care provider can help determine your BMI and help you achieve or maintain a healthy weight. Get regular exercise Get regular exercise. This is one of the most important things you can do for your health. Most adults should: Exercise for at least 150 minutes each week. The exercise should increase your heart rate and make you sweat (moderate-intensity exercise). Do strengthening exercises at least twice a week. This is in addition to the moderate-intensity exercise. Spend less time sitting. Even light  physical activity can be beneficial. Watch cholesterol and blood lipids Have your blood tested for lipids and cholesterol at 59 years of age, then have this test every 5 years. Have your cholesterol levels checked more often if: Your lipid or cholesterol levels are high. You are older than 59 years of age. You are at high risk for heart disease. What should I know about cancer screening? Depending on your health history and family history, you may need to have cancer screening at various ages. This may include screening for: Breast cancer. Cervical cancer. Colorectal cancer. Skin cancer. Lung cancer. What should I know about heart disease, diabetes, and high blood pressure? Blood pressure and heart disease High blood pressure causes heart disease and increases the risk of stroke. This is more likely to develop in people who have high blood pressure readings or are overweight. Have your blood pressure checked: Every 3-5 years if you are 61-71 years of age. Every year if you are 36 years old or older. Diabetes Have regular diabetes screenings. This checks your fasting blood sugar level. Have the screening done: Once every three years after age 40 if you are at a normal weight and have a low risk for diabetes. More often and at a younger age if you are overweight or have a high risk for diabetes. What should I know about preventing infection? Hepatitis B If you have a higher risk for hepatitis B, you should be screened for this virus. Talk with your health care provider to find out if you are at risk for hepatitis B infection. Hepatitis C Testing is recommended for: Everyone born from [birth year] through 1963. Anyone with known risk factors for hepatitis C. Sexually transmitted infections (STIs) Get screened for STIs, including gonorrhea and chlamydia, if: You are sexually active and are younger than 59 years of age. You are older than 59 years of age and your health care provider tells you  that you are at risk for this type of infection. Your sexual activity has changed since you were last screened, and you are at increased risk for chlamydia or gonorrhea. Ask your health care provider if you are at risk. Ask your health care provider about whether you are at high risk for HIV. Your health care provider may recommend a prescription medicine to help prevent HIV infection. If you choose to take medicine to prevent HIV, you should first get tested for HIV. You should then be tested every 3 months for as long as you are taking the medicine. Pregnancy If you are about to stop having your period (premenopausal) and you  may become pregnant, seek counseling before you get pregnant. Take 400 to 800 micrograms (mcg) of folic acid every day if you become pregnant. Ask for birth control (contraception) if you want to prevent pregnancy. Osteoporosis and menopause Osteoporosis is a disease in which the bones lose minerals and strength with aging. This can result in bone fractures. If you are 32 years old or older, or if you are at risk for osteoporosis and fractures, ask your health care provider if you should: Be screened for bone loss. Take a calcium or vitamin D supplement to lower your risk of fractures. Be given hormone replacement therapy (HRT) to treat symptoms of menopause. Follow these instructions at home: Alcohol use Do not drink alcohol if: Your health care provider tells you not to drink. You are pregnant, may be pregnant, or are planning to become pregnant. If you drink alcohol: Limit how much you have to: 0-1 drink a day. Know how much alcohol is in your drink. In the [place] one drink equals one 12 oz bottle of beer (355 mL), one 5 oz glass of wine (148 mL), or one 1 oz glass of hard liquor (44 mL). Lifestyle Do not use any products that contain nicotine or tobacco. These products include cigarettes, chewing tobacco, and vaping devices, such as e-cigarettes. If you need help  quitting, ask your health care provider. Do not use street drugs. Do not share needles. Ask your health care provider for help if you need support or information about quitting drugs. General instructions Schedule regular health, dental, and eye exams. Stay current with your vaccines. Tell your health care provider if: You often feel depressed. You have ever been abused or do not feel safe at home. Summary Adopting a healthy lifestyle and getting preventive care are important in promoting health and wellness. Follow your health care provider's instructions about healthy diet, exercising, and getting tested or screened for diseases. Follow your health care provider's instructions on monitoring your cholesterol and blood pressure. This information is not intended to replace advice given to you by your health care provider. Make sure you discuss any questions you have with your health care provider. Document Revised: 11/15/2020 Document Reviewed: 11/15/2020 Elsevier Patient Education  2022 Elsevier Inc.    Edwina Barth, MD [facility]

## 2021-09-21 NOTE — Telephone Encounter (Signed)
Prior authorization approved for [facility]. Effective 09/20/2021 - 04/22/2022. Patient notified via mychart. ?

## 2021-09-21 NOTE — Assessment & Plan Note (Signed)
Stable and well-controlled on daily Wixela.  Only uses rescue inhaler sparingly. ?

## 2021-09-21 NOTE — Patient Instructions (Signed)

## 2021-10-08 ENCOUNTER — Other Ambulatory Visit (INDEPENDENT_AMBULATORY_CARE_PROVIDER_SITE_OTHER): Payer: Self-pay | Admitting: Physician Assistant

## 2021-10-08 DIAGNOSIS — R609 Edema, unspecified: Secondary | ICD-10-CM

## 2021-10-17 ENCOUNTER — Other Ambulatory Visit (INDEPENDENT_AMBULATORY_CARE_PROVIDER_SITE_OTHER): Payer: Self-pay

## 2021-10-17 ENCOUNTER — Ambulatory Visit (INDEPENDENT_AMBULATORY_CARE_PROVIDER_SITE_OTHER): Payer: BC Managed Care – PPO | Admitting: Family Medicine

## 2021-10-17 DIAGNOSIS — R632 Polyphagia: Secondary | ICD-10-CM

## 2021-10-17 DIAGNOSIS — R609 Edema, unspecified: Secondary | ICD-10-CM

## 2021-10-17 MED ORDER — HYDROCHLOROTHIAZIDE 12.5 MG PO CAPS: 12.5000 mg | ORAL_CAPSULE | Freq: Every day | ORAL | 0 refills | Status: DC

## 2021-10-17 MED ORDER — WEGOVY 0.5 MG/0.5ML ~~LOC~~ SOAJ: 0.5000 mg | SUBCUTANEOUS | 1 refills | Status: DC

## 2021-10-17 MED ORDER — LOMAIRA 8 MG PO TABS: ORAL_TABLET | ORAL | 0 refills | Status: DC

## 2021-10-24 ENCOUNTER — Ambulatory Visit (INDEPENDENT_AMBULATORY_CARE_PROVIDER_SITE_OTHER): Payer: BC Managed Care – PPO | Admitting: Family Medicine

## 2021-11-02 NOTE — Progress Notes (Signed)
?TeleHealth Visit:  ?This visit was completed with telemedicine (audio/video) technology. ?Natasha Lyons has verbally consented to this TeleHealth visit. The patient is located at home, the provider is located at home. The participants in this visit include the listed provider and patient. The visit was conducted today via MyChart video. ? ?OBESITY ?Natasha Lyons is here to discuss her progress with her obesity treatment plan along with follow-up of her obesity related diagnoses.  ? ?Today's visit was # 24 ?Starting weight: 240 lbs ?Starting date: 12/01/2019 ?Weight at last in office visit: 201 lbs on 09/19/21 ?Total weight loss: 39 lbs at last in office visit on 09/19/21. ?Today's reported weight: 204 lbs   ? ?Nutrition Plan: the Category 3 Plan 80% of time. ?Hunger is well controlled. Cravings are moderately controlled.  ?Current exercise: bicycling 20-30 3-5 days per week. ? ?Interim History: She feels that that her weight loss has stalled. She has psoriatic arthritis and notes she has a lot of inflammation right now. She reports that she follows the plan well 6 days per week. She doesn't always get in all of the dinner protein. We discussed supplementing with milk or yogurt but she says that she does not tolerate this well. ? ?Assessment/Plan:  ?1. Type II Diabetes ?HgbA1c is at goal. ?Medication(s): Wegovy 0.50 mg week.  Tolerating well. ? ?Lab Results  ?Component Value Date  ? HGBA1C 5.8 (H) 05/23/2021  ? HGBA1C 5.7 (H) 08/31/2020  ? HGBA1C 6.1 (H) 04/01/2020  ? ?Lab Results  ?Component Value Date  ? LDLCALC 103 (H) 05/23/2021  ? CREATININE 0.54 (L) 05/23/2021  ? ? ?Plan: ?Continue Wegovy at 0.5 mg/week ? ?Obesity: Current BMI 34.31 ?Natasha Lyons is currently in the action stage of change. As such, her goal is to continue with weight loss efforts.  ?She has agreed to the Category 3 Plan.  ? ?Exercise goals: continue current regimen ? ?Behavioral modification strategies: increasing lean protein intake. ?Fasting labs next  OV. ? ?Natasha Lyons has agreed to follow-up with our clinic in 4 weeks.  ? ?No orders of the defined types were placed in this encounter. ? ? ?There are no discontinued medications.  ? ?No orders of the defined types were placed in this encounter. ?   ? ?Objective:  ? ?VITALS: Per patient if applicable, see vitals. ?GENERAL: Alert and in no acute distress. ?CARDIOPULMONARY: No increased WOB. Speaking in clear sentences.  ?PSYCH: Pleasant and cooperative. Speech normal rate and rhythm. Affect is appropriate. Insight and judgement are appropriate. Attention is focused, linear, and appropriate.  ?NEURO: Oriented as arrived to appointment on time with no prompting.  ? ?Lab Results  ?Component Value Date  ? CREATININE 0.54 (L) 05/23/2021  ? BUN 13 05/23/2021  ? NA 140 05/23/2021  ? K 4.0 05/23/2021  ? CL 103 05/23/2021  ? CO2 23 05/23/2021  ? ?Lab Results  ?Component Value Date  ? ALT 16 05/23/2021  ? AST 16 05/23/2021  ? ALKPHOS 94 05/23/2021  ? BILITOT 0.3 05/23/2021  ? ?Lab Results  ?Component Value Date  ? HGBA1C 5.8 (H) 05/23/2021  ? HGBA1C 5.7 (H) 08/31/2020  ? HGBA1C 6.1 (H) 04/01/2020  ? HGBA1C 6.2 (H) 12/01/2019  ? HGBA1C 6.4 04/20/2017  ? ?Lab Results  ?Component Value Date  ? INSULIN 13.9 12/01/2019  ? ?Lab Results  ?Component Value Date  ? TSH 1.44 03/15/2021  ? ?Lab Results  ?Component Value Date  ? CHOL 169 05/23/2021  ? HDL 44 05/23/2021  ? LDLCALC 103 (H) 05/23/2021  ?  TRIG 121 05/23/2021  ? CHOLHDL 3.8 05/23/2021  ? ?Lab Results  ?Component Value Date  ? WBC 10.4 05/23/2021  ? HGB 13.8 05/23/2021  ? HCT 40.8 05/23/2021  ? MCV 80 05/23/2021  ? PLT 298 05/23/2021  ? ?Lab Results  ?Component Value Date  ? FERRITIN 41.9 03/15/2021  ? ?Lab Results  ?Component Value Date  ? VD25OH 73.3 05/23/2021  ? VD25OH 24.6 03/15/2021  ? VD25OH 38.5 08/31/2020  ? ? ?Attestation Statements:  ? ?Reviewed by clinician on day of visit: allergies, medications, problem list, medical history, surgical history, family history, social  history, and previous encounter notes. ? ? ?

## 2021-11-03 ENCOUNTER — Telehealth (INDEPENDENT_AMBULATORY_CARE_PROVIDER_SITE_OTHER): Payer: BC Managed Care – PPO | Admitting: Family Medicine

## 2021-11-03 ENCOUNTER — Encounter (INDEPENDENT_AMBULATORY_CARE_PROVIDER_SITE_OTHER): Payer: Self-pay | Admitting: Family Medicine

## 2021-11-03 DIAGNOSIS — Z7985 Long-term (current) use of injectable non-insulin antidiabetic drugs: Secondary | ICD-10-CM | POA: Diagnosis not present

## 2021-11-03 DIAGNOSIS — E669 Obesity, unspecified: Secondary | ICD-10-CM | POA: Diagnosis not present

## 2021-11-03 DIAGNOSIS — Z6834 Body mass index (BMI) 34.0-34.9, adult: Secondary | ICD-10-CM

## 2021-11-03 DIAGNOSIS — E1169 Type 2 diabetes mellitus with other specified complication: Secondary | ICD-10-CM

## 2021-11-09 ENCOUNTER — Ambulatory Visit: Payer: BC Managed Care – PPO | Admitting: Internal Medicine

## 2021-11-11 ENCOUNTER — Ambulatory Visit: Payer: BC Managed Care – PPO | Admitting: Podiatry

## 2021-11-30 ENCOUNTER — Ambulatory Visit (INDEPENDENT_AMBULATORY_CARE_PROVIDER_SITE_OTHER): Payer: BC Managed Care – PPO | Admitting: Nurse Practitioner

## 2021-11-30 ENCOUNTER — Encounter (INDEPENDENT_AMBULATORY_CARE_PROVIDER_SITE_OTHER): Payer: Self-pay | Admitting: Nurse Practitioner

## 2021-11-30 VITALS — BP 125/79 | HR 65 | Temp 97.5°F | Ht 64.0 in | Wt 201.0 lb

## 2021-11-30 DIAGNOSIS — E559 Vitamin D deficiency, unspecified: Secondary | ICD-10-CM | POA: Diagnosis not present

## 2021-11-30 DIAGNOSIS — R609 Edema, unspecified: Secondary | ICD-10-CM | POA: Diagnosis not present

## 2021-11-30 DIAGNOSIS — E669 Obesity, unspecified: Secondary | ICD-10-CM

## 2021-11-30 DIAGNOSIS — Z6834 Body mass index (BMI) 34.0-34.9, adult: Secondary | ICD-10-CM

## 2021-11-30 DIAGNOSIS — Z7985 Long-term (current) use of injectable non-insulin antidiabetic drugs: Secondary | ICD-10-CM

## 2021-11-30 DIAGNOSIS — I10 Essential (primary) hypertension: Secondary | ICD-10-CM | POA: Diagnosis not present

## 2021-11-30 DIAGNOSIS — E1169 Type 2 diabetes mellitus with other specified complication: Secondary | ICD-10-CM

## 2021-11-30 DIAGNOSIS — E785 Hyperlipidemia, unspecified: Secondary | ICD-10-CM

## 2021-11-30 MED ORDER — HYDROCHLOROTHIAZIDE 12.5 MG PO CAPS: 12.5000 mg | ORAL_CAPSULE | Freq: Every day | ORAL | 0 refills | Status: DC

## 2021-11-30 MED ORDER — TIRZEPATIDE 2.5 MG/0.5ML ~~LOC~~ SOAJ: 2.5000 mg | SUBCUTANEOUS | 0 refills | Status: DC

## 2021-12-01 LAB — COMPREHENSIVE METABOLIC PANEL
ALT: 16 IU/L (ref 0–32)
AST: 16 IU/L (ref 0–40)
Albumin/Globulin Ratio: 1.6 (ref 1.2–2.2)
Albumin: 4.5 g/dL (ref 3.8–4.9)
Alkaline Phosphatase: 93 IU/L (ref 44–121)
BUN/Creatinine Ratio: 27 — ABNORMAL HIGH (ref 9–23)
BUN: 14 mg/dL (ref 6–24)
Bilirubin Total: 0.3 mg/dL (ref 0.0–1.2)
CO2: 23 mmol/L (ref 20–29)
Calcium: 9.3 mg/dL (ref 8.7–10.2)
Chloride: 102 mmol/L (ref 96–106)
Creatinine, Ser: 0.52 mg/dL — ABNORMAL LOW (ref 0.57–1.00)
Globulin, Total: 2.8 g/dL (ref 1.5–4.5)
Glucose: 100 mg/dL — ABNORMAL HIGH (ref 70–99)
Potassium: 4.1 mmol/L (ref 3.5–5.2)
Sodium: 140 mmol/L (ref 134–144)
Total Protein: 7.3 g/dL (ref 6.0–8.5)
eGFR: 108 mL/min/{1.73_m2} (ref >59)

## 2021-12-01 LAB — HEMOGLOBIN A1C
Est. average glucose Bld gHb Est-mCnc: 114 mg/dL
Hgb A1c MFr Bld: 5.6 % (ref 4.8–5.6)

## 2021-12-01 LAB — LIPID PANEL WITH LDL/HDL RATIO
Cholesterol, Total: 170 mg/dL (ref 100–199)
HDL: 48 mg/dL (ref >39)
LDL Chol Calc (NIH): 106 mg/dL — ABNORMAL HIGH (ref 0–99)
LDL/HDL Ratio: 2.2 ratio (ref 0.0–3.2)
Triglycerides: 88 mg/dL (ref 0–149)
VLDL Cholesterol Cal: 16 mg/dL (ref 5–40)

## 2021-12-01 LAB — VITAMIN D 25 HYDROXY (VIT D DEFICIENCY, FRACTURES): Vit D, 25-Hydroxy: 51.5 ng/mL (ref 30.0–100.0)

## 2021-12-04 ENCOUNTER — Encounter (INDEPENDENT_AMBULATORY_CARE_PROVIDER_SITE_OTHER): Payer: Self-pay | Admitting: Nurse Practitioner

## 2021-12-06 ENCOUNTER — Telehealth (INDEPENDENT_AMBULATORY_CARE_PROVIDER_SITE_OTHER): Payer: Self-pay | Admitting: Nurse Practitioner

## 2021-12-06 ENCOUNTER — Encounter (INDEPENDENT_AMBULATORY_CARE_PROVIDER_SITE_OTHER): Payer: Self-pay

## 2021-12-06 NOTE — Telephone Encounter (Signed)
Natasha Lyons - Prior authorization denied for [facility]. Per insurance: -The member has tried and failed the required number of formulary alternatives. Formulary alternatives are: Ozempic, Rybelsus, Trulicity, Victoza. (Requirement: 3 in a class with 3 or more alternatives, 2 in a class with alternatives, or 1 in a class with only 1 alternative.)Note: Formulary alternatives may require a prior authorization. Patient sent denial message via mychart.

## 2021-12-06 NOTE — Telephone Encounter (Signed)
Prior authorization has been started for Mounjaro. Will notify patient and provider once response is received.  

## 2021-12-08 NOTE — Progress Notes (Signed)
Chief Complaint:   OBESITY Natasha Lyons is here to discuss her progress with her obesity treatment plan along with follow-up of her obesity related diagnoses. Natasha Lyons is on the Category 3 Plan and states she is following her eating plan approximately 85% of the time. Natasha Lyons states she is using a stationary bike and walking 30 minutes 3-4 times per week.  Today's visit was #: 25 Starting weight: 240 lbs Starting date: 12/01/2019 Today's weight: 201 lbs Today's date: 11/30/2021 Total lbs lost to date: 39 lbs Total lbs lost since last in-office visit: 0  Interim History: Natasha Lyons has maintained her weight since last visit. She is struggling with a flare up with her psoriatic arthritis. She is seeing Ortho for back pain and has been scheduled for an MRI. She is currently taking Phentermine 8 mg and Wegovy 0.5 mg. Denies any side effects.  Subjective:   1. Essential hypertension/ Edema, unspecified type Natasha Lyons's blood pressure looks good today. She is currently taking Norvasc '5mg'$  and HTCZ 12.5 mg. Denies any side effects and any chest pain,shortness of breath or palpitations.  2. Vitamin D deficiency Natasha Lyons is currently taking prescription Vit D 50,000 IU once a week. Denies side effects-denies nausea, vomiting or muscle weakness   3. Type 2 diabetes mellitus with other specified complication, without long-term current use of insulin (HCC) Natasha Lyons has taken Ozempic and Metformin. She stopped metformin stopped due to side effects. She is not currently checking blood sugar due to not having supplies at home.  She is Up to date on eye exam, not currently on a statin or ACE/ARB.  4. Hyperlipidemia, unspecified hyperlipidemia type Natasha Lyons is not currently on any medication.  Assessment/Plan:   1. Essential hypertension/Edema, unspecified type We will refill Microzide 12.5 mg by mouth daily for 1 month with 0 refills. Side effect discussed. Labs obtained today. Natasha Lyons is working on healthy weight  loss and exercise to improve blood pressure control. We will watch for signs of hypotension as she continues her lifestyle modifications.  -Refill hydrochlorothiazide (MICROZIDE) 12.5 MG capsule; Take 1 capsule (12.5 mg total) by mouth daily.  Dispense: 30 capsule; Refill: 0  - Comprehensive metabolic panel  2. Vitamin D deficiency Labs obtained today. Low Vitamin D level contributes to fatigue and are associated with obesity, breast, and colon cancer. She agrees to continue to take prescription Vitamin D '@50'$ ,000 IU every week and will follow-up for routine testing of Vitamin D, at least 2-3 times per year to avoid over-replacement.  - VITAMIN D 25 Hydroxy (Vit-D Deficiency, Fractures) - Comprehensive metabolic panel  3. Type 2 diabetes mellitus with other specified complication, without long-term current use of insulin (HCC) STOP Wegovy and START Mounjaro 2.'5mg'$  subcutaneous once weekly for 1 month with 0 refills. Side effects discussed.  Labs obtained today. She will see new PCP June 28th. Good blood sugar control is important to decrease the likelihood of diabetic complications such as nephropathy, neuropathy, limb loss, blindness, coronary artery disease, and death. Intensive lifestyle modification including diet, exercise and weight loss are the first line of treatment for diabetes.   -START tirzepatide (MOUNJARO) 2.5 MG/0.5ML Pen; Inject 2.5 mg into the skin once a week.  Dispense: 2 mL; Refill: 0  - Hemoglobin A1c - Comprehensive metabolic panel  4. Hyperlipidemia, unspecified hyperlipidemia type Labs obtained today. Cardiovascular risk and specific lipid/LDL goals reviewed.  We discussed several lifestyle modifications today and Natasha Lyons will continue to work on diet, exercise and weight loss efforts. Orders and follow up  as documented in patient record.   Counseling Intensive lifestyle modifications are the first line treatment for this issue. Dietary changes: Increase soluble fiber.  Decrease simple carbohydrates. Exercise changes: Moderate to vigorous-intensity aerobic activity 150 minutes per week if tolerated. Lipid-lowering medications: see documented in medical record.  - Comprehensive metabolic panel - Lipid Panel With LDL/HDL Ratio  5. Obesity, current BMI 34.5 Natasha Lyons is currently in the action stage of change. As such, her goal is to continue with weight loss efforts. She has agreed to the Category 3 Plan.   Stop Phentermine due to inadequate weight loss.   Exercise goals: AS is.  Behavioral modification strategies: decreasing liquid calories, no skipping meals, and planning for success.  Natasha Lyons has agreed to follow-up with our clinic in 3 weeks. She was informed of the importance of frequent follow-up visits to maximize her success with intensive lifestyle modifications for her multiple health conditions.   Natasha Lyons was informed we would discuss her lab results at her next visit unless there is a critical issue that needs to be addressed sooner. Natasha Lyons agreed to keep her next visit at the agreed upon time to discuss these results.  Objective:   Blood pressure 125/79, pulse 65, temperature (!) 97.5 F (36.4 C), height '5\' 4"'$  (1.626 m), weight 201 lb (91.2 kg), last menstrual period 05/05/2019, SpO2 97 %. Body mass index is 34.5 kg/m.  General: Cooperative, alert, well developed, in no acute distress. HEENT: Conjunctivae and lids unremarkable. Cardiovascular: Regular rhythm.  Lungs: Normal work of breathing. Neurologic: No focal deficits.   Lab Results  Component Value Date   CREATININE 0.52 (L) 11/30/2021   BUN 14 11/30/2021   NA 140 11/30/2021   K 4.1 11/30/2021   CL 102 11/30/2021   CO2 23 11/30/2021   Lab Results  Component Value Date   ALT 16 11/30/2021   AST 16 11/30/2021   ALKPHOS 93 11/30/2021   BILITOT 0.3 11/30/2021   Lab Results  Component Value Date   HGBA1C 5.6 11/30/2021   HGBA1C 5.8 (H) 05/23/2021   HGBA1C 5.7 (H) 08/31/2020    HGBA1C 6.1 (H) 04/01/2020   HGBA1C 6.2 (H) 12/01/2019   Lab Results  Component Value Date   INSULIN 13.9 12/01/2019   Lab Results  Component Value Date   TSH 1.44 03/15/2021   Lab Results  Component Value Date   CHOL 170 11/30/2021   HDL 48 11/30/2021   LDLCALC 106 (H) 11/30/2021   TRIG 88 11/30/2021   CHOLHDL 3.8 05/23/2021   Lab Results  Component Value Date   VD25OH 51.5 11/30/2021   VD25OH 73.3 05/23/2021   VD25OH 24.6 03/15/2021   Lab Results  Component Value Date   WBC 10.4 05/23/2021   HGB 13.8 05/23/2021   HCT 40.8 05/23/2021   MCV 80 05/23/2021   PLT 298 05/23/2021   Lab Results  Component Value Date   FERRITIN 41.9 03/15/2021   Attestation Statements:   Reviewed by clinician on day of visit: allergies, medications, problem list, medical history, surgical history, family history, social history, and previous encounter notes.  I, Brendell Tyus, RMA, am acting as transcriptionist for Everardo Pacific, FNP.   I have reviewed the above documentation for accuracy and completeness, and I agree with the above. Everardo Pacific, FNP

## 2021-12-13 ENCOUNTER — Other Ambulatory Visit: Payer: Self-pay | Admitting: Orthopedic Surgery

## 2021-12-13 DIAGNOSIS — M545 Low back pain, unspecified: Secondary | ICD-10-CM

## 2021-12-13 DIAGNOSIS — M542 Cervicalgia: Secondary | ICD-10-CM

## 2021-12-16 ENCOUNTER — Other Ambulatory Visit (INDEPENDENT_AMBULATORY_CARE_PROVIDER_SITE_OTHER): Payer: Self-pay | Admitting: Physician Assistant

## 2021-12-16 DIAGNOSIS — K219 Gastro-esophageal reflux disease without esophagitis: Secondary | ICD-10-CM

## 2021-12-22 ENCOUNTER — Ambulatory Visit (INDEPENDENT_AMBULATORY_CARE_PROVIDER_SITE_OTHER): Payer: BC Managed Care – PPO | Admitting: Nurse Practitioner

## 2021-12-25 ENCOUNTER — Ambulatory Visit
Admission: RE | Admit: 2021-12-25 | Discharge: 2021-12-25 | Disposition: A | Discharge status: Home / Self Care | Payer: BC Managed Care – PPO | Source: Ambulatory Visit | Attending: Orthopedic Surgery | Admitting: Orthopedic Surgery

## 2021-12-25 DIAGNOSIS — M542 Cervicalgia: Secondary | ICD-10-CM

## 2021-12-25 DIAGNOSIS — M545 Low back pain, unspecified: Secondary | ICD-10-CM

## 2021-12-29 ENCOUNTER — Encounter: Payer: Self-pay | Admitting: Emergency Medicine

## 2021-12-29 ENCOUNTER — Encounter: Payer: Self-pay | Admitting: Internal Medicine

## 2022-01-02 ENCOUNTER — Other Ambulatory Visit (INDEPENDENT_AMBULATORY_CARE_PROVIDER_SITE_OTHER): Payer: Self-pay | Admitting: Nurse Practitioner

## 2022-01-02 DIAGNOSIS — R609 Edema, unspecified: Secondary | ICD-10-CM

## 2022-01-04 ENCOUNTER — Ambulatory Visit: Payer: BC Managed Care – PPO | Admitting: Emergency Medicine

## 2022-01-04 ENCOUNTER — Other Ambulatory Visit (INDEPENDENT_AMBULATORY_CARE_PROVIDER_SITE_OTHER): Payer: Self-pay | Admitting: Physician Assistant

## 2022-01-04 ENCOUNTER — Encounter: Payer: Self-pay | Admitting: Emergency Medicine

## 2022-01-04 VITALS — BP 126/70 | HR 85 | Temp 98.1°F | Ht 64.0 in | Wt 205.5 lb

## 2022-01-04 DIAGNOSIS — I10 Essential (primary) hypertension: Secondary | ICD-10-CM | POA: Diagnosis not present

## 2022-01-04 DIAGNOSIS — M150 Primary generalized (osteo)arthritis: Secondary | ICD-10-CM

## 2022-01-04 DIAGNOSIS — L4050 Arthropathic psoriasis, unspecified: Secondary | ICD-10-CM | POA: Diagnosis not present

## 2022-01-04 DIAGNOSIS — E039 Hypothyroidism, unspecified: Secondary | ICD-10-CM | POA: Diagnosis not present

## 2022-01-04 DIAGNOSIS — M159 Polyosteoarthritis, unspecified: Secondary | ICD-10-CM | POA: Insufficient documentation

## 2022-01-04 DIAGNOSIS — H9203 Otalgia, bilateral: Secondary | ICD-10-CM | POA: Diagnosis not present

## 2022-01-04 DIAGNOSIS — E559 Vitamin D deficiency, unspecified: Secondary | ICD-10-CM

## 2022-01-04 DIAGNOSIS — J4520 Mild intermittent asthma, uncomplicated: Secondary | ICD-10-CM

## 2022-01-04 DIAGNOSIS — J302 Other seasonal allergic rhinitis: Secondary | ICD-10-CM

## 2022-01-04 MED ORDER — FLUTICASONE-SALMETEROL 250-50 MCG/ACT IN AEPB: 1.0000 | INHALATION_SPRAY | Freq: Two times a day (BID) | RESPIRATORY_TRACT | 7 refills | Status: DC

## 2022-01-04 MED ORDER — MONTELUKAST SODIUM 10 MG PO TABS: 10.0000 mg | ORAL_TABLET | Freq: Every day | ORAL | 1 refills | Status: DC

## 2022-01-04 NOTE — Assessment & Plan Note (Signed)
Intermittent symptoms.  Continue cetirizine 10 mg as needed and Singulair 10 mg daily.

## 2022-01-04 NOTE — Patient Instructions (Signed)

## 2022-01-04 NOTE — Assessment & Plan Note (Signed)
Clinically euthyroid.  Continue Armour Thyroid 15 mg daily 

## 2022-01-04 NOTE — Progress Notes (Signed)
Natasha Lyons 61 y.o.   Chief Complaint  Patient presents with   Follow-up   Medication Refill    Wants to discuss about medication refills    Ear Pain    Having issue with an ongoing pain in ear ( comes and go, feels moist)       HISTORY OF PRESENT ILLNESS: This is a 61 y.o. female complaining of bilateral ear discomfort on and off for several months. Has history of psoriatic arthritis. Has the following chronic medical problems: 1.  Psoriatic arthritis.  Sees rheumatologist on a regular basis.  On multiple medications handled by him. 2.  Essential hypertension: On amlodipine 5 mg daily 3.  Obesity: Under care of medical weight management group 4.  History of asthma.  Stable.  Does not need to use albuterol on a regular basis.  Uses Wixela inhaler daily 5.  Hyper thyroidism: On Armour Thyroid 50 mg 6.  Osteoarthritis and chronic pain.  Uses Tylenol and or NSAIDs as needed 7.  Prediabetes 8.  GERD: Takes Protonix 40 mg daily.  Seeing rheumatologist on a regular basis.  Recent MRI of lumbar and cervical spine reviewed with patient.  Medication Refill Pertinent negatives include no abdominal pain, chest pain, chills, congestion, coughing, fever, headaches, nausea, rash, sore throat or vomiting.     Prior to Admission medications   Medication Sig Start Date End Date Taking? Authorizing Provider  Acetaminophen (TYLENOL 8 HOUR PO) Take by mouth.   Yes [provider]  albuterol (PROVENTIL) (2.5 MG/3ML) 0.083% nebulizer solution Take 2.5 mg by nebulization every 6 (six) hours as needed for wheezing or shortness of breath.   Yes [provider]  albuterol (VENTOLIN HFA) 108 (90 Base) MCG/ACT inhaler Inhale 2 puffs into the lungs every 6 (six) hours as needed for wheezing.   Yes [provider]  amLODipine (NORVASC) 5 MG tablet Take 5 mg by mouth daily.   Yes [provider]  ARMOUR THYROID 15 MG tablet Take 15 mg by mouth daily. 10/28/19   Yes [provider]  BD PEN NEEDLE NANO 2ND GEN 32G X 4 MM MISC USE ONCE A WEEK 04/26/20  Yes Briscoe Deutscher, DO  blood glucose meter kit and supplies KIT Dispense based on patient and insurance preference. Use up to four times daily as directed. 03/16/21  Yes Whitmire, Joneen Boers, FNP  cetirizine (ZYRTEC) 10 MG tablet Take 10 mg by mouth daily.   Yes [provider]  clobetasol ointment (TEMOVATE) 5.83 % Apply 1 application topically 2 (two) times daily. 08/11/19  Yes [provider]  COLLAGEN PO Take 1 tablet by mouth daily at 6 (six) AM.   Yes [provider]  fluticasone (FLONASE) 50 MCG/ACT nasal spray  05/12/18  Yes [provider]  fluticasone-salmeterol (WIXELA INHUB) 250-50 MCG/ACT AEPB Inhale 1 puff into the lungs in the morning and at bedtime. 01/04/22  Yes Zavier Canela, Ines Bloomer, MD  folic acid (FOLVITE) 094 MCG tablet Take 800 mcg by mouth daily.    Yes [provider]  hydrochlorothiazide (MICROZIDE) 12.5 MG capsule Take 1 capsule (12.5 mg total) by mouth daily. 11/30/21  Yes Everardo Pacific, FNP  methotrexate (RHEUMATREX) 2.5 MG tablet Take 20 mg by mouth every Sunday. Caution:Chemotherapy. Protect from light.   Yes [provider]  naproxen (NAPROSYN) 500 MG tablet Take 1 tablet (500 mg total) by mouth 2 (two) times daily. 08/08/17  Yes Rancour, Annie Main, MD  pantoprazole (PROTONIX) 40 MG tablet Take 1 tablet (  40 mg total) by mouth every morning. 09/08/21  Yes Abby Potash, PA-C  Secukinumab 150 MG/ML SOSY Inject 300 mg into the skin every 30 (thirty) days.   Yes [provider]  Semaglutide-Weight Management (WEGOVY) 0.5 MG/0.5ML SOAJ Inject 0.5 mg into the skin once a week. 10/17/21  Yes Briscoe Deutscher, DO  VITAMIN D, CHOLECALCIFEROL, PO Take 1 tablet by mouth daily at 6 (six) AM.   Yes [provider]  Vitamin D, Ergocalciferol, (DRISDOL) 1.25 MG (50000 UNIT) CAPS capsule Take 1 capsule (50,000 Units total) by  mouth every 7 (seven) days. 09/08/21  Yes Abby Potash, PA-C  WIXELA INHUB 250-50 MCG/DOSE AEPB Inhale 1 puff into the lungs 2 (two) times daily. 10/10/19  Yes [provider]  estradiol (ESTRACE) 0.1 MG/GM vaginal cream 1 gram vaginally qhs x 1 week, then change to twice weekly. Use a pea sized amount externally at the same time. Patient not taking: Reported on 01/04/2022 03/07/21   Salvadore Dom, MD  montelukast (SINGULAIR) 10 MG tablet Take 1 tablet (10 mg total) by mouth at bedtime. 01/04/22 07/03/22  Horald Pollen, MD  NONFORMULARY OR COMPOUNDED ITEM Testosterone 1%, apply 0.5 grams to the lower abdomen daily. Patient not taking: Reported on 01/04/2022 03/15/21   Salvadore Dom, MD  tirzepatide [facility]) 2.5 MG/0.5ML Pen Inject 2.5 mg into the skin once a week. 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   Depression 04/05/2020   Other specified anxiety disorders 04/05/2020   Chronic pain of right knee 04/05/2020   Diabetes mellitus ([place]) 02/09/2020   History of endometriosis 02/06/2020   Osteoporosis 01/06/2020   Nonallopathic lesion of cervical region 04/24/2016   Nonallopathic lesion of rib cage 04/24/2016   SI (sacroiliac) joint dysfunction 03/21/2016   Nonallopathic lesion of sacral region 03/21/2016   Nonallopathic lesion of lumbosacral region 03/21/2016   Nonallopathic lesion of  thoracic region 03/21/2016   Rheumatoid bursitis, 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) 04/07/2015   Chronic pain syndrome 04/07/2015   Psoriasis 08/10/2013   Vitamin D deficiency 08/10/2013   Thyroid nodule 05/02/2013   Essential hypertension 03/06/2013   Acquired hypothyroidism 03/06/2013   Extrinsic asthma, mild, intermittent 03/06/2013   Class 2 severe obesity with serious comorbidity and body mass index (BMI) of 36.0 to 36.9 in adult [facility]) 03/06/2013    Past Medical History:  Diagnosis Date   Allergy    takes Zyrtec daily   Anemia    Anxiety    Arthritis    psorriatric arthritis   Asthma    Albuterol as needed as well as neb as needed   Back pain    Bilateral swelling of feet    Chronic back pain    pinched nerve   Constipation    Cough    Depression    Family history of adverse reaction to anesthesia    pts dad gets sick with anesthesia  Fibromyalgia    takes Effexor daily   Fibromyalgia    Gallbladder problem    GERD (gastroesophageal reflux disease)    Headache    seasonal    History of bronchitis 2016   History of shingles    Hypertension    takes Amlodipine daily   Hypothyroid    Infertility, female    Joint pain    Joint pain    Joint swelling    Muscle spasm    takes Robaxin as needed   Osteoarthritis    Osteoarthritis    Palpitations    Pneumonia    hx of-10+ yrs ago   Prediabetes    Psoriasis    Psoriatic arthritis ([place])    Shortness of breath    Vitamin D deficiency     Past Surgical History:  Procedure Laterality Date   APPENDECTOMY     BIOPSY THYROID  2014   results benign   CHOLECYSTECTOMY  2008   DIAGNOSTIC LAPAROSCOPY     d/c endometriosis   DILATION AND CURETTAGE OF UTERUS     INSERTION OF MESH N/A 06/15/2016   Procedure: INSERTION OF MESH;  Surgeon: Autumn Messing III, MD;  Location: [facility];  Service: General;  Laterality: N/A;   KNEE ARTHROSCOPY Bilateral 2011   MASS EXCISION Right 04/21/2014    Procedure: EXCISION OF RIGHT BACK MASS;  Surgeon: Coralie Keens, MD;  Location: [facility];  Service: General;  Laterality: Right;   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   TONSILLECTOMY     VENTRAL HERNIA REPAIR  06/15/2016   VENTRAL HERNIA REPAIR N/A 06/15/2016   Procedure: LAPAROSCOPIC VENTRAL HERNIA WITH MESH;  Surgeon: Autumn Messing III, MD;  Location: [facility];  Service: General;  Laterality: N/A;    Social History   Socioeconomic History   Marital status: Married    Spouse name: Not on file   Number of children: 0   Years of education: Not on file   Highest education level: Not on file  Occupational History   Not on file  Tobacco Use   Smoking status: Never   Smokeless tobacco: Never  Vaping Use   Vaping Use: Never used  Substance and Sexual Activity   Alcohol use: Yes    Comment: occ   Drug use: No   Sexual activity: Not Currently    Birth control/protection: Post-menopausal  Other Topics Concern   Not on file  Social History Narrative   Not on file   Social Determinants of Health   Financial Resource Strain: Not on file  Food Insecurity: Not on file  Transportation Needs: Not on file  Physical Activity: Not on file  Stress: Not on file  Social Connections: Not on file  Intimate Partner Violence: Not on file    Family History  Problem Relation Age of Onset   Diabetes Mother    Hypertension Mother    Hyperlipidemia Mother    Heart disease Mother    Stroke Mother    Kidney disease Mother    Thyroid disease Mother    Obesity Mother    Hypertension Father      Review of Systems  Constitutional: Negative.  Negative for chills and fever.  HENT:  Positive for ear pain. Negative for congestion and sore throat.   Respiratory: Negative.  Negative for cough and shortness of breath.   Cardiovascular: Negative.  Negative for chest pain and palpitations.  Gastrointestinal:  Negative for abdominal pain, diarrhea, nausea and vomiting.   Genitourinary:  Negative.   Musculoskeletal:  Positive for back pain and joint pain.  Skin: Negative.  Negative for rash.  Neurological:  Negative for dizziness and headaches.  All other systems reviewed and are negative.  Today's Vitals   01/04/22 1534  BP: 126/70  Pulse: 85  Temp: 98.1 F (36.7 C)  TempSrc: Oral  SpO2: 96%  Weight: 205 lb 8 oz (93.2 kg)  Height: 5' 4"  (1.626 m)   Body mass index is 35.27 kg/m.   Physical Exam Vitals reviewed.  Constitutional:      Appearance: Normal appearance.  HENT:     Head: Normocephalic.     Right Ear: Tympanic membrane, ear canal and external ear normal.     Left Ear: Tympanic membrane, ear canal and external ear normal.     Nose: Nose normal.     Mouth/Throat:     Mouth: Mucous membranes are moist.     Pharynx: Oropharynx is clear.  Eyes:     Extraocular Movements: Extraocular movements intact.     Conjunctiva/sclera: Conjunctivae normal.     Pupils: Pupils are equal, round, and reactive to light.  Cardiovascular:     Rate and Rhythm: Normal rate and regular rhythm.     Pulses: Normal pulses.     Heart sounds: Normal heart sounds.  Pulmonary:     Effort: Pulmonary effort is normal.     Breath sounds: Normal breath sounds.  Musculoskeletal:     Cervical back: No tenderness.  Lymphadenopathy:     Cervical: No cervical adenopathy.  Skin:    General: Skin is warm and dry.     Capillary Refill: Capillary refill takes less than 2 seconds.  Neurological:     General: No focal deficit present.     Mental Status: She is alert and oriented to person, place, and time.  Psychiatric:        Mood and Affect: Mood normal.        Behavior: Behavior normal.      ASSESSMENT & PLAN: A total of 48 minutes was spent with the patient and counseling/coordination of care regarding preparing for this visit, review of most recent office visit notes, review of most recent rheumatologist visit notes, review of most recent MRI of lumbar and  cervical spine, review of multiple chronic medical problems and their management, review of all medications, review of most recent blood work results, prognosis, documentation, and need for follow-up.  Problem List Items Addressed This Visit       Cardiovascular and Mediastinum   Essential hypertension    Well-controlled hypertension. BP Readings from Last 3 Encounters:  01/04/22 126/70  11/30/21 125/79  09/21/21 118/82  Continue amlodipine 5 mg and hydrochlorothiazide 12.5 mg daily.         Respiratory   Extrinsic asthma, mild, intermittent    Well-controlled.  Takes Wixela 250-50 puff twice a day      Relevant Medications   montelukast (SINGULAIR) 10 MG tablet   fluticasone-salmeterol (WIXELA INHUB) 250-50 MCG/ACT AEPB     Endocrine   Acquired hypothyroidism    Clinically euthyroid.  Continue Armour Thyroid 15 mg daily.        Musculoskeletal and Integument   Psoriatic arthritis ([place])    Well-controlled.  Sees rheumatologist on a regular basis.  Rheumatic medications handled by him.      Primary osteoarthritis involving multiple joints     Other   Seasonal allergies    Intermittent symptoms.  Continue cetirizine 10 mg as needed  and Singulair 10 mg daily.      Ear discomfort, bilateral - Primary    No significant clinical findings.  Chronic symptoms affecting quality of life. We will get ENT evaluation.      Relevant Orders   Ambulatory referral to ENT   Patient Instructions  Health Maintenance, Female Adopting a healthy lifestyle and getting preventive care are important in promoting health and wellness. Ask your health care provider about: The right schedule for you to have regular tests and exams. Things you can do on your own to prevent diseases and keep yourself healthy. What should I know about diet, weight, and exercise? Eat a healthy diet  Eat a diet that includes plenty of vegetables, fruits, low-fat dairy products, and lean protein. Do not eat a  lot of foods that are high in solid fats, added sugars, or sodium. Maintain a healthy weight Body mass index (BMI) is used to identify weight problems. It estimates body fat based on height and weight. Your health care provider can help determine your BMI and help you achieve or maintain a healthy weight. Get regular exercise Get regular exercise. This is one of the most important things you can do for your health. Most adults should: Exercise for at least 150 minutes each week. The exercise should increase your heart rate and make you sweat (moderate-intensity exercise). Do strengthening exercises at least twice a week. This is in addition to the moderate-intensity exercise. Spend less time sitting. Even light physical activity can be beneficial. Watch cholesterol and blood lipids Have your blood tested for lipids and cholesterol at 59 years of age, then have this test every 5 years. Have your cholesterol levels checked more often if: Your lipid or cholesterol levels are high. You are older than 59 years of age. You are at high risk for heart disease. What should I know about cancer screening? Depending on your health history and family history, you may need to have cancer screening at various ages. This may include screening for: Breast cancer. Cervical cancer. Colorectal cancer. Skin cancer. Lung cancer. What should I know about heart disease, diabetes, and high blood pressure? Blood pressure and heart disease High blood pressure causes heart disease and increases the risk of stroke. This is more likely to develop in people who have high blood pressure readings or are overweight. Have your blood pressure checked: Every 3-5 years if you are 75-56 years of age. Every year if you are 52 years old or older. Diabetes Have regular diabetes screenings. This checks your fasting blood sugar level. Have the screening done: Once every three years after age 20 if you are at a normal weight and  have a low risk for diabetes. More often and at a younger age if you are overweight or have a high risk for diabetes. What should I know about preventing infection? Hepatitis B If you have a higher risk for hepatitis B, you should be screened for this virus. Talk with your health care provider to find out if you are at risk for hepatitis B infection. Hepatitis C Testing is recommended for: Everyone born from [birth year] through 1963. Anyone with known risk factors for hepatitis C. Sexually transmitted infections (STIs) Get screened for STIs, including gonorrhea and chlamydia, if: You are sexually active and are younger than 59 years of age. You are older than 59 years of age and your health care provider tells you that you are at risk for this type of infection. Your sexual activity has  changed since you were last screened, and you are at increased risk for chlamydia or gonorrhea. Ask your health care provider if you are at risk. Ask your health care provider about whether you are at high risk for HIV. Your health care provider may recommend a prescription medicine to help prevent HIV infection. If you choose to take medicine to prevent HIV, you should first get tested for HIV. You should then be tested every 3 months for as long as you are taking the medicine. Pregnancy If you are about to stop having your period (premenopausal) and you may become pregnant, seek counseling before you get pregnant. Take 400 to 800 micrograms (mcg) of folic acid every day if you become pregnant. Ask for birth control (contraception) if you want to prevent pregnancy. Osteoporosis and menopause Osteoporosis is a disease in which the bones lose minerals and strength with aging. This can result in bone fractures. If you are 104 years old or older, or if you are at risk for osteoporosis and fractures, ask your health care provider if you should: Be screened for bone loss. Take a calcium or vitamin D supplement to lower your  risk of fractures. Be given hormone replacement therapy (HRT) to treat symptoms of menopause. Follow these instructions at home: Alcohol use Do not drink alcohol if: Your health care provider tells you not to drink. You are pregnant, may be pregnant, or are planning to become pregnant. If you drink alcohol: Limit how much you have to: 0-1 drink a day. Know how much alcohol is in your drink. In the [place] one drink equals one 12 oz bottle of beer (355 mL), one 5 oz glass of wine (148 mL), or one 1 oz glass of hard liquor (44 mL). Lifestyle Do not use any products that contain nicotine or tobacco. These products include cigarettes, chewing tobacco, and vaping devices, such as e-cigarettes. If you need help quitting, ask your health care provider. Do not use street drugs. Do not share needles. Ask your health care provider for help if you need support or information about quitting drugs. General instructions Schedule regular health, dental, and eye exams. Stay current with your vaccines. Tell your health care provider if: You often feel depressed. You have ever been abused or do not feel safe at home. Summary Adopting a healthy lifestyle and getting preventive care are important in promoting health and wellness. Follow your health care provider's instructions about healthy diet, exercising, and getting tested or screened for diseases. Follow your health care provider's instructions on monitoring your cholesterol and blood pressure. This information is not intended to replace advice given to you by your health care provider. Make sure you discuss any questions you have with your health care provider. Document Revised: 11/15/2020 Document Reviewed: 11/15/2020 Elsevier Patient Education  [place], MD [facility]

## 2022-01-04 NOTE — Assessment & Plan Note (Signed)
Well-controlled.  Takes Wixela 250-50 puff twice a day

## 2022-01-04 NOTE — Assessment & Plan Note (Signed)
Well-controlled hypertension. BP Readings from Last 3 Encounters:  01/04/22 126/70  11/30/21 125/79  09/21/21 118/82  Continue amlodipine 5 mg and hydrochlorothiazide 12.5 mg daily.

## 2022-01-04 NOTE — Assessment & Plan Note (Signed)
No significant clinical findings.  Chronic symptoms affecting quality of life. We will get ENT evaluation.

## 2022-01-04 NOTE — Assessment & Plan Note (Signed)
Well-controlled.  Sees rheumatologist on a regular basis.  Rheumatic medications handled by him.

## 2022-01-05 ENCOUNTER — Encounter: Payer: Self-pay | Admitting: Obstetrics and Gynecology

## 2022-01-05 ENCOUNTER — Ambulatory Visit: Payer: BC Managed Care – PPO | Admitting: Internal Medicine

## 2022-01-16 LAB — HM DIABETES EYE EXAM

## 2022-02-01 ENCOUNTER — Other Ambulatory Visit: Payer: Self-pay | Admitting: Emergency Medicine

## 2022-02-01 MED ORDER — ALBUTEROL SULFATE (2.5 MG/3ML) 0.083% IN NEBU
2.5000 mg | INHALATION_SOLUTION | Freq: Four times a day (QID) | RESPIRATORY_TRACT | 0 refills | Status: DC | PRN
Start: 2022-02-01 — End: 2023-11-21

## 2022-02-03 ENCOUNTER — Other Ambulatory Visit (INDEPENDENT_AMBULATORY_CARE_PROVIDER_SITE_OTHER): Payer: Self-pay | Admitting: Physician Assistant

## 2022-02-03 ENCOUNTER — Ambulatory Visit (INDEPENDENT_AMBULATORY_CARE_PROVIDER_SITE_OTHER): Payer: BC Managed Care – PPO

## 2022-02-03 ENCOUNTER — Ambulatory Visit: Payer: BC Managed Care – PPO | Admitting: Podiatry

## 2022-02-03 DIAGNOSIS — M7751 Other enthesopathy of right foot: Secondary | ICD-10-CM

## 2022-02-03 DIAGNOSIS — M722 Plantar fascial fibromatosis: Secondary | ICD-10-CM

## 2022-02-03 DIAGNOSIS — E559 Vitamin D deficiency, unspecified: Secondary | ICD-10-CM

## 2022-02-03 MED ORDER — TRIAMCINOLONE ACETONIDE 10 MG/ML IJ SUSP
20.0000 mg | Freq: Once | INTRAMUSCULAR | Status: AC
  Administered 2022-02-03: 20 mg

## 2022-02-06 NOTE — Progress Notes (Signed)
Subjective:   Patient ID: Natasha Lyons, female   DOB: 61 y.o.   MRN: [id number]   HPI Patient presents currently with numerous different problems with 1 being the swelling around the fifth metatarsal head right that become very inflamed and sore secondarily exquisite discomfort in the second joint of the right foot with inflammation and also is noted to have continued moderate issues with the plantar fascia at mid arch area plantarly   ROS      Objective:  Physical Exam  Patient does have systemic arthritic condition which may contribute to the problems and has developed what may be an acute inflammatory condition versus mechanical inflammatory condition with fluid buildup around the fifth MPJ right around the first and second MPJ right and into the mid arch area right foot     Assessment:  Inflammatory capsulitis of the fifth MPJ right around the first MPJ right and second along with fascial inflammation mid arch right      Plan:  H&P reviewed all conditions discussed the bilateral arch pain with the right being worse and recommended physical therapy supportive shoe gear and ice therapy and for the other areas I did do sterile prep and I did 2 separate injections 1 around the fifth MPJ right and 1 around the first and second MPJ right periarticular to reduce the inflammatory process and advised on rigid bottom shoes and patient will be seen back to reevaluate.  X-rays do indicate slight changes with some slight lucency associated with arthritis but nothing significant no indications of severe arthritis of the joint surfaces

## 2022-02-14 ENCOUNTER — Other Ambulatory Visit (INDEPENDENT_AMBULATORY_CARE_PROVIDER_SITE_OTHER): Payer: Self-pay | Admitting: Physician Assistant

## 2022-02-14 DIAGNOSIS — K219 Gastro-esophageal reflux disease without esophagitis: Secondary | ICD-10-CM

## 2022-02-15 ENCOUNTER — Encounter (INDEPENDENT_AMBULATORY_CARE_PROVIDER_SITE_OTHER): Payer: Self-pay

## 2022-02-17 ENCOUNTER — Ambulatory Visit: Payer: BC Managed Care – PPO | Admitting: Podiatry

## 2022-03-01 NOTE — Progress Notes (Signed)
61 y.o. G0P0000 Married   Asian Other or two or more races Not Hispanic or Latino female here for annual exam.   No vaginal bleeding.   She would like to be more sexually active. She was prescribed vaginal estrogen last year for atrophy and topical testosterone last year for low libido.  She did feel the vaginal estrogen helped some. Able to enjoy intercourse. Didn't feel the testosterone helped much. She would like to restart the vaginal estrogen.  Husband with medical issues and some ED.   She has psoriatic arthritis, has had frequent flares and has been in a lot of pain.     She had lower left quadrant pain about a month ago that has since subsided. She is going to see GI for possible IBS.  No bladder c/o.   Patient's last menstrual period was 07/10/2018.          Sexually active: Yes.    The current method of family planning is post menopausal status.    Exercising: No.   She walks the dog Smoker:  no  Health Maintenance: Pap:  03/07/21 WNL Hr Hpv Neg  History of abnormal Pap:  no MMG:  03/05/21 density A Bi-rads 1 neg  BMD:   none  Colonoscopy: at age 17 was normal  TDaP:  03/23/17 Gardasil: n/a   reports that she has never smoked. She has never used smokeless tobacco. She reports current alcohol use. She reports that she does not use drugs. Rare ETOH. 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 Married x 26 years, next month.  Daughter is 17, getting her graduate degree, will be a Engineer, water. Lives locally.   Past Medical History:  Diagnosis Date   Allergy    takes Zyrtec daily   Anemia    Anxiety    Arthritis    psorriatric arthritis   Asthma    Albuterol as needed as well as neb as needed   Back pain    Bilateral swelling of feet    Chronic back pain    pinched nerve   Constipation    Cough    Depression    Family history of adverse reaction to  anesthesia    pts dad gets sick with anesthesia   Fibromyalgia    takes Effexor daily   Fibromyalgia    Gallbladder problem    GERD (gastroesophageal reflux disease)    Headache    seasonal    History of bronchitis 2016   History of shingles    Hypertension    takes Amlodipine daily   Hypothyroid    Infertility, female    Joint pain    Joint pain    Joint swelling    Muscle spasm    takes Robaxin as needed   Osteoarthritis    Osteoarthritis    Palpitations    Pneumonia    hx of-10+ yrs ago   Prediabetes    Psoriasis    Psoriatic arthritis (HCC)    Shortness of breath    Vitamin D deficiency     Past Surgical History:  Procedure Laterality Date   APPENDECTOMY     BIOPSY THYROID  2014   results benign   CHOLECYSTECTOMY  2008   DIAGNOSTIC LAPAROSCOPY     d/c endometriosis   DILATION AND CURETTAGE OF UTERUS     INSERTION OF MESH N/A 06/15/2016  Procedure: INSERTION OF MESH;  Surgeon: Autumn Messing III, MD;  Location: [facility];  Service: General;  Laterality: N/A;   KNEE ARTHROSCOPY Bilateral 2011   MASS EXCISION Right 04/21/2014   Procedure: EXCISION OF RIGHT BACK MASS;  Surgeon: Coralie Keens, MD;  Location: [facility];  Service: General;  Laterality: Right;   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     albuterol (PROVENTIL) (2.5 MG/3ML) 0.083% nebulizer solution Take 3 mLs (2.5 mg total) by nebulization every 6 (six) hours as needed for wheezing or shortness of breath. 75 mL 0   albuterol (VENTOLIN HFA) 108 (90 Base) MCG/ACT inhaler Inhale 2 puffs into the lungs every 6 (six) hours as needed for wheezing.      amLODipine (NORVASC) 5 MG tablet Take 5 mg by mouth daily.     ARMOUR THYROID 15 MG tablet Take 15 mg by mouth daily.     BD PEN NEEDLE NANO 2ND GEN 32G X 4 MM MISC USE ONCE A WEEK 100 each 0   blood glucose meter kit and supplies KIT Dispense based on patient and insurance preference. Use up to four times daily as directed. 1 each 0   cetirizine (ZYRTEC) 10 MG tablet Take 10 mg by mouth daily.     clobetasol ointment (TEMOVATE) 7.89 % Apply 1 application topically 2 (two) times daily.     COLLAGEN PO Take 1 tablet by mouth daily at 6 (six) AM.     fluticasone (FLONASE) 50 MCG/ACT nasal spray      fluticasone-salmeterol (WIXELA INHUB) 250-50 MCG/ACT AEPB Inhale 1 puff into the lungs in the morning and at bedtime. 14 each 7   folic acid (FOLVITE) 381 MCG tablet Take 800 mcg by mouth daily.      hydrochlorothiazide (MICROZIDE) 12.5 MG capsule Take 1 capsule (12.5 mg total) by mouth daily. 30 capsule 0   methotrexate (RHEUMATREX) 2.5 MG tablet Take 20 mg by mouth every Sunday. Caution:Chemotherapy. Protect from light.     montelukast (SINGULAIR) 10 MG tablet Take 1 tablet (10 mg total) by mouth at bedtime. 90 tablet 1   naproxen (NAPROSYN) 500 MG tablet Take 1 tablet (500 mg total) by mouth 2 (two) times daily. 30 tablet 0   pantoprazole (PROTONIX) 40 MG tablet Take 1 tablet (40 mg total) by mouth every morning. 30 tablet 3   Secukinumab 150 MG/ML SOSY Inject 300 mg into the skin every 30 (thirty) days.     VITAMIN D, CHOLECALCIFEROL, PO Take 1 tablet by mouth daily at 6 (six) AM.     Vitamin D, Ergocalciferol, (DRISDOL) 1.25 MG (50000 UNIT) CAPS capsule Take 1 capsule (50,000 Units total) by mouth every 7 (seven) days. 12 capsule 0   WIXELA INHUB 250-50 MCG/DOSE AEPB Inhale 1 puff into the lungs 2 (two) times daily.     No current facility-administered medications for this visit.    Family History  Problem Relation Age of Onset   Diabetes Mother    Hypertension Mother    Hyperlipidemia  Mother    Heart disease Mother    Stroke Mother    Kidney disease Mother  Thyroid disease Mother    Obesity Mother    Hypertension Father     Review of Systems  Genitourinary:  Positive for vaginal pain.  All other systems reviewed and are negative.   Exam:   BP 134/82   Pulse 88   Ht _0  (1.626 m)   Wt 208 lb (94.3 kg)   LMP 07/10/2018   SpO2 100%   BMI 35.70 kg/m   Weight change: _1 @ Height:   Height: _2  (162.6 cm)  Ht Readings from Last 3 Encounters:  03/08/22 _3  (1.626 m)  01/04/22 _4  (1.626 m)  11/30/21 _5  (1.626 m)    General appearance: alert, cooperative and appears stated age Head: Normocephalic, without obvious abnormality, atraumatic Neck: no adenopathy, supple, symmetrical, trachea midline and thyroid normal to inspection and palpation Lungs: clear to auscultation bilaterally Cardiovascular: regular rate and rhythm Breasts: normal appearance, no masses or tenderness Abdomen: soft, non-tender; non distended,  no masses,  no organomegaly Extremities: extremities normal, atraumatic, no cyanosis or edema Skin: Skin color, texture, turgor normal. No rashes or lesions Lymph nodes: Cervical, supraclavicular, and axillary nodes normal. No abnormal inguinal nodes palpated Neurologic: Grossly normal   Pelvic: External genitalia:  no lesions              Urethra:  normal appearing urethra with no masses, tenderness or lesions              Bartholins and Skenes: normal                 Vagina: normal appearing vagina with normal color and discharge, no lesions              Cervix: no lesions               Bimanual Exam:  Uterus:   no masses or tenderness              Adnexa: no mass, fullness, tenderness               Rectovaginal: Confirms               Anus:  normal sphincter tone, no lesions  Gae Dry, CMA chaperoned for the exam.  1. Well woman exam Discussed breast self exam Discussed calcium and vit D intake No pap this  year Mammogram due, she will schedule Colonoscopy next year Labs with primary  2. Vaginal atrophy - Estradiol 10 MCG TABS vaginal tablet; Place 1 tablet (10 mcg total) vaginally 2 (two) times a week.  Dispense: 24 tablet; Refill: 3

## 2022-03-08 ENCOUNTER — Encounter: Payer: Self-pay | Admitting: Obstetrics and Gynecology

## 2022-03-08 ENCOUNTER — Ambulatory Visit (INDEPENDENT_AMBULATORY_CARE_PROVIDER_SITE_OTHER): Payer: BC Managed Care – PPO | Admitting: Obstetrics and Gynecology

## 2022-03-08 VITALS — BP 134/82 | HR 88 | Ht 64.0 in | Wt 208.0 lb

## 2022-03-08 DIAGNOSIS — N952 Postmenopausal atrophic vaginitis: Secondary | ICD-10-CM | POA: Diagnosis not present

## 2022-03-08 DIAGNOSIS — Z01419 Encounter for gynecological examination (general) (routine) without abnormal findings: Secondary | ICD-10-CM

## 2022-03-08 MED ORDER — ESTRADIOL 10 MCG VA TABS: 1.0000 | ORAL_TABLET | VAGINAL | 3 refills | Status: DC

## 2022-03-08 NOTE — Patient Instructions (Signed)

## 2022-03-22 ENCOUNTER — Other Ambulatory Visit: Payer: Self-pay | Admitting: Emergency Medicine

## 2022-03-22 MED ORDER — AMLODIPINE BESYLATE 5 MG PO TABS
5.0000 mg | ORAL_TABLET | Freq: Every day | ORAL | 1 refills | Status: DC
Start: 2022-03-22 — End: 2022-09-23

## 2022-05-05 ENCOUNTER — Telehealth: Payer: Self-pay

## 2022-05-05 NOTE — Telephone Encounter (Signed)
Patient is scheduled for 05/09/2022 for her surgical clearance appt

## 2022-05-05 NOTE — Telephone Encounter (Signed)
Patient is planning to have surgery with Dr.Gray at [facility]. There system is down currently to send over a pre-op form but patient wants to know if she needs to come in to be evaluated or if we would be able to fill out form based on last appointment. Would like a call back if possible.

## 2022-05-09 ENCOUNTER — Telehealth: Payer: Self-pay | Admitting: Emergency Medicine

## 2022-05-09 ENCOUNTER — Encounter: Payer: Self-pay | Admitting: Emergency Medicine

## 2022-05-09 ENCOUNTER — Ambulatory Visit: Payer: BC Managed Care – PPO | Admitting: Emergency Medicine

## 2022-05-09 VITALS — BP 132/76 | HR 74 | Temp 98.1°F | Ht 64.0 in | Wt 210.4 lb

## 2022-05-09 DIAGNOSIS — M159 Polyosteoarthritis, unspecified: Secondary | ICD-10-CM

## 2022-05-09 DIAGNOSIS — R7303 Prediabetes: Secondary | ICD-10-CM | POA: Diagnosis not present

## 2022-05-09 DIAGNOSIS — G894 Chronic pain syndrome: Secondary | ICD-10-CM

## 2022-05-09 DIAGNOSIS — I10 Essential (primary) hypertension: Secondary | ICD-10-CM | POA: Diagnosis not present

## 2022-05-09 DIAGNOSIS — E039 Hypothyroidism, unspecified: Secondary | ICD-10-CM | POA: Diagnosis not present

## 2022-05-09 DIAGNOSIS — J4520 Mild intermittent asthma, uncomplicated: Secondary | ICD-10-CM

## 2022-05-09 DIAGNOSIS — L4050 Arthropathic psoriasis, unspecified: Secondary | ICD-10-CM | POA: Diagnosis not present

## 2022-05-09 DIAGNOSIS — J302 Other seasonal allergic rhinitis: Secondary | ICD-10-CM

## 2022-05-09 DIAGNOSIS — Z01818 Encounter for other preprocedural examination: Secondary | ICD-10-CM

## 2022-05-09 DIAGNOSIS — Z23 Encounter for immunization: Secondary | ICD-10-CM | POA: Diagnosis not present

## 2022-05-09 LAB — HEMOGLOBIN A1C: Hgb A1c MFr Bld: 6.3 % (ref 4.6–6.5)

## 2022-05-09 LAB — COMPREHENSIVE METABOLIC PANEL
ALT: 14 U/L (ref 0–35)
AST: 15 U/L (ref 0–37)
Albumin: 4.1 g/dL (ref 3.5–5.2)
Alkaline Phosphatase: 71 U/L (ref 39–117)
BUN: 12 mg/dL (ref 6–23)
CO2: 29 mEq/L (ref 19–32)
Calcium: 9.3 mg/dL (ref 8.4–10.5)
Chloride: 103 mEq/L (ref 96–112)
Creatinine, Ser: 0.6 mg/dL (ref 0.40–1.20)
GFR: 98.25 mL/min (ref >60.00)
Glucose, Bld: 117 mg/dL — ABNORMAL HIGH (ref 70–99)
Potassium: 3.7 mEq/L (ref 3.5–5.1)
Sodium: 139 mEq/L (ref 135–145)
Total Bilirubin: 0.7 mg/dL (ref 0.2–1.2)
Total Protein: 7.1 g/dL (ref 6.0–8.3)

## 2022-05-09 LAB — CBC WITH DIFFERENTIAL/PLATELET
Basophils Absolute: 0 10*3/uL (ref 0.0–0.1)
Basophils Relative: 0.7 % (ref 0.0–3.0)
Eosinophils Absolute: 0.3 10*3/uL (ref 0.0–0.7)
Eosinophils Relative: 4.1 % (ref 0.0–5.0)
HCT: 38.5 % (ref 36.0–46.0)
Hemoglobin: 12.6 g/dL (ref 12.0–15.0)
Lymphocytes Relative: 27.7 % (ref 12.0–46.0)
Lymphs Abs: 1.9 10*3/uL (ref 0.7–4.0)
MCHC: 32.8 g/dL (ref 30.0–36.0)
MCV: 83.3 fl (ref 78.0–100.0)
Monocytes Absolute: 0.4 10*3/uL (ref 0.1–1.0)
Monocytes Relative: 6.6 % (ref 3.0–12.0)
Neutro Abs: 4.1 10*3/uL (ref 1.4–7.7)
Neutrophils Relative %: 60.9 % (ref 43.0–77.0)
Platelets: 263 10*3/uL (ref 150.0–400.0)
RBC: 4.62 Mil/uL (ref 3.87–5.11)
RDW: 14.4 % (ref 11.5–15.5)
WBC: 6.7 10*3/uL (ref 4.0–10.5)

## 2022-05-09 LAB — TSH: TSH: 1.53 u[IU]/mL (ref 0.35–5.50)

## 2022-05-09 NOTE — Assessment & Plan Note (Signed)
Well-controlled hypertension. Continue amlodipine 5 mg daily BP Readings from Last 3 Encounters:  05/09/22 132/76  03/08/22 134/82  01/04/22 126/70

## 2022-05-09 NOTE — Assessment & Plan Note (Signed)
Involving multiple joints Chronic pain Acetaminophen as needed Sees rheumatologist on a regular basis Presently on weekly methotrexate 20 mg and Secukinumab monthly

## 2022-05-09 NOTE — Telephone Encounter (Signed)
Surgical clearance form in provider box awaiting signature

## 2022-05-09 NOTE — Assessment & Plan Note (Signed)
Takes cetirizine, Singulair, and Flonase as needed Stable

## 2022-05-09 NOTE — Assessment & Plan Note (Signed)
Well-controlled. Continue Wixela 1 puff twice a day and albuterol as rescue inhaler

## 2022-05-09 NOTE — Assessment & Plan Note (Signed)
Clinically euthyroid.  TSH done today. Continue Armour Thyroid 15 mg daily

## 2022-05-09 NOTE — Patient Instructions (Signed)
Health Maintenance After Age 59 After age 59, you are at a higher risk for certain long-term diseases and infections as well as injuries from falls. Falls are a major cause of broken bones and head injuries in people who are older than age 59. Getting regular preventive care can help to keep you healthy and well. Preventive care includes getting regular testing and making lifestyle changes as recommended by your health care provider. Talk with your health care provider about: Which screenings and tests you should have. A screening is a test that checks for a disease when you have no symptoms. A diet and exercise plan that is right for you. What should I know about screenings and tests to prevent falls? Screening and testing are the best ways to find a health problem early. Early diagnosis and treatment give you the best chance of managing medical conditions that are common after age 59. Certain conditions and lifestyle choices may make you more likely to have a fall. Your health care provider may recommend: Regular vision checks. Poor vision and conditions such as cataracts can make you more likely to have a fall. If you wear glasses, make sure to get your prescription updated if your vision changes. Medicine review. Work with your health care provider to regularly review all of the medicines you are taking, including over-the-counter medicines. Ask your health care provider about any side effects that may make you more likely to have a fall. Tell your health care provider if any medicines that you take make you feel dizzy or sleepy. Strength and balance checks. Your health care provider may recommend certain tests to check your strength and balance while standing, walking, or changing positions. Foot health exam. Foot pain and numbness, as well as not wearing proper footwear, can make you more likely to have a fall. Screenings, including: Osteoporosis screening. Osteoporosis is a condition that causes  the bones to get weaker and break more easily. Blood pressure screening. Blood pressure changes and medicines to control blood pressure can make you feel dizzy. Depression screening. You may be more likely to have a fall if you have a fear of falling, feel depressed, or feel unable to do activities that you used to do. Alcohol use screening. Using too much alcohol can affect your balance and may make you more likely to have a fall. Follow these instructions at home: Lifestyle Do not drink alcohol if: Your health care provider tells you not to drink. If you drink alcohol: Limit how much you have to: 0-1 drink a day for women. 0-2 drinks a day for men. Know how much alcohol is in your drink. In the [place] one drink equals one 12 oz bottle of beer (355 mL), one 5 oz glass of wine (148 mL), or one 1 oz glass of hard liquor (44 mL). Do not use any products that contain nicotine or tobacco. These products include cigarettes, chewing tobacco, and vaping devices, such as e-cigarettes. If you need help quitting, ask your health care provider. Activity  Follow a regular exercise program to stay fit. This will help you maintain your balance. Ask your health care provider what types of exercise are appropriate for you. If you need a cane or walker, use it as recommended by your health care provider. Wear supportive shoes that have nonskid soles. Safety  Remove any tripping hazards, such as rugs, cords, and clutter. Install safety equipment such as grab bars in bathrooms and safety rails on stairs. Keep rooms and walkways   well-lit. General instructions Talk with your health care provider about your risks for falling. Tell your health care provider if: You fall. Be sure to tell your health care provider about all falls, even ones that seem minor. You feel dizzy, tiredness (fatigue), or off-balance. Take over-the-counter and prescription medicines only as told by your health care provider. These include  supplements. Eat a healthy diet and maintain a healthy weight. A healthy diet includes low-fat dairy products, low-fat (lean) meats, and fiber from whole grains, beans, and lots of fruits and vegetables. Stay current with your vaccines. Schedule regular health, dental, and eye exams. Summary Having a healthy lifestyle and getting preventive care can help to protect your health and wellness after age 59. Screening and testing are the best way to find a health problem early and help you avoid having a fall. Early diagnosis and treatment give you the best chance for managing medical conditions that are more common for people who are older than age 59. Falls are a major cause of broken bones and head injuries in people who are older than age 59. Take precautions to prevent a fall at home. Work with your health care provider to learn what changes you can make to improve your health and wellness and to prevent falls. This information is not intended to replace advice given to you by your health care provider. Make sure you discuss any questions you have with your health care provider. Document Revised: 11/15/2020 Document Reviewed: 11/15/2020 Elsevier Patient Education  2023 [employer]vier Inc.  

## 2022-05-09 NOTE — Progress Notes (Signed)
Subjective:    Natasha Lyons is a 61 y.o. female who presents to the office today for a preoperative consultation at the request of orthopedic surgeon who plans on performing left knee replacement on November 20. Planned anesthesia: general. The patient has the following known anesthesia issues:  None . Patients bleeding risk: no recent abnormal bleeding. Patient does not have objections to receiving blood products if needed.  The following portions of the patient's history were reviewed and updated as appropriate: allergies, current medications, past family history, past medical history, past social history, past surgical history, and problem list.  Review of Systems A comprehensive review of systems was negative.    Objective:    BP 132/76   Pulse 74   Temp 98.1 F (36.7 C) (Oral)   Ht '5\' 4"'$  (1.626 m)   Wt 210 lb 6 oz (95.4 kg)   LMP 07/10/2018   SpO2 96%   BMI 36.11 kg/m   General Appearance:    Alert, cooperative, no distress, appears stated age  Head:    Normocephalic, without obvious abnormality, atraumatic  Eyes:    PERRL, conjunctiva/corneas clear, EOM's intact    Ears:    Normal TM's and external ear canals, both ears  Nose:   Nares normal, septum midline, mucosa normal, no drainage    or sinus tenderness  Throat:   Lips, mucosa, and tongue normal; teeth and gums normal  Neck:   Supple, symmetrical, trachea midline, no adenopathy;    thyroid:  no enlargement/tenderness/nodules; no carotid   bruit or JVD  Back:     Symmetric, no curvature, ROM normal, no CVA tenderness  Lungs:     Clear to auscultation bilaterally, respirations unlabored  Chest Wall:    No tenderness or deformity   Heart:    Regular rate and rhythm, S1 and S2 normal, no murmur, rub   or gallop     Abdomen:     Soft, non-tender, bowel sounds active all four quadrants,    no masses, no organomegaly        Extremities:   Extremities normal, atraumatic, no cyanosis or edema  Pulses:   2+ and  symmetric all extremities  Skin:   Skin color, texture, turgor normal, no rashes or lesions  Lymph nodes:   Cervical, supraclavicular, and axillary nodes normal  Neurologic:   CNII-XII intact, normal strength, sensation and reflexes    throughout    Predictors of intubation difficulty:  Morbid obesity? no  Anatomically abnormal facies? no  Prominent incisors? no  Receding mandible? no  Short, thick neck? no  Neck range of motion: normal   Cardiographics ECG:  Not done today Echocardiogram from June 2022 reviewed.  Normal.  Imaging Chest x-ray: Not needed  Lab Review  Unremarkable labs with acceptable values    Assessment:      61 y.o. female with planned surgery as above.   Known risk factors for perioperative complications: None   Difficulty with intubation is not anticipated. Problem List Items Addressed This Visit       Cardiovascular and Mediastinum   Essential hypertension    Well-controlled hypertension. Continue amlodipine 5 mg daily BP Readings from Last 3 Encounters:  05/09/22 132/76  03/08/22 134/82  01/04/22 126/70         Relevant Orders   CBC with Differential/Platelet   Comprehensive metabolic panel     Respiratory   Extrinsic asthma, mild, intermittent    Well-controlled. Continue Wixela 1 puff twice a day and  albuterol as rescue inhaler        Endocrine   Acquired hypothyroidism    Clinically euthyroid.  TSH done today. Continue Armour Thyroid 15 mg daily      Relevant Orders   TSH     Musculoskeletal and Integument   Psoriatic arthritis ([place])    Involving multiple joints Chronic pain Acetaminophen as needed Sees rheumatologist on a regular basis Presently on weekly methotrexate 20 mg and Secukinumab monthly      Primary osteoarthritis involving multiple joints    Creating chronic pain.  Sees rheumatologist on a regular basis Scheduled for left knee replacement late November        Other   Chronic pain syndrome    Seasonal allergies    Takes cetirizine, Singulair, and Flonase as needed Stable      Other Visit Diagnoses     Preoperative clearance    -  Primary   Relevant Orders   CBC with Differential/Platelet   Prediabetes       Relevant Orders   Hemoglobin A1c       Cardiac Risk Estimation: Low risk  Current medications which may produce withdrawal symptoms if withheld perioperatively: None   Plan:    1. Preoperative workup as follows hemoglobin, hematocrit, electrolytes, creatinine, glucose, liver function studies. 2. Change in medication regimen before surgery: none, continue medication regimen including morning of surgery, with sip of water. 3. Prophylaxis for cardiac events with perioperative beta-blockers: not indicated. 4. Invasive hemodynamic monitoring perioperatively: at the discretion of anesthesiologist. 5. Deep vein thrombosis prophylaxis postoperatively:regimen to be chosen by surgical team. 6. Surveillance for postoperative MI with ECG immediately postoperatively and on postoperative days 1 and 2 AND troponin levels 24 hours postoperatively and on day 4 or hospital discharge (whichever comes first): at the discretion of anesthesiologist. 7. Other measures:    None

## 2022-05-09 NOTE — Assessment & Plan Note (Signed)
Creating chronic pain.  Sees rheumatologist on a regular basis Scheduled for left knee replacement late November

## 2022-05-09 NOTE — Telephone Encounter (Signed)
Received Preoperative Risk Assessment form through fax. Printed out form and placed in Dr. Barry Brunner box at the front.

## 2022-05-11 NOTE — Telephone Encounter (Signed)
Surgical Clearance faxed , fax confirmation received

## 2022-05-15 ENCOUNTER — Ambulatory Visit: Payer: BC Managed Care – PPO | Admitting: Cardiology

## 2022-05-15 VITALS — BP 136/80 | HR 83 | Resp 16 | Ht 64.0 in | Wt 212.2 lb

## 2022-05-15 DIAGNOSIS — Z01810 Encounter for preprocedural cardiovascular examination: Secondary | ICD-10-CM

## 2022-05-15 DIAGNOSIS — E7800 Pure hypercholesterolemia, unspecified: Secondary | ICD-10-CM

## 2022-05-15 DIAGNOSIS — K219 Gastro-esophageal reflux disease without esophagitis: Secondary | ICD-10-CM

## 2022-05-15 DIAGNOSIS — I10 Essential (primary) hypertension: Secondary | ICD-10-CM

## 2022-05-15 DIAGNOSIS — R7303 Prediabetes: Secondary | ICD-10-CM

## 2022-05-15 NOTE — Progress Notes (Signed)
Date:  05/15/2022   ID:  Natasha Lyons, DOB Nov 23, 1960, MRN [id number]  PCP:  Horald Pollen, MD  Cardiologist:  Rex Kras, DO, [facility] (established care 12/20/2020) Former Cardiology Providers: None  Date: 05/15/22 Last Office Visit: 12/20/2020  Chief Complaint  Patient presents with   Medical Clearance    For knee replacement surgery    HPI  Natasha Lyons is a 61 y.o. female whose past medical history and cardiovascular risk factors include: Hypertension, GERD, obesity due to excess calories, asthma, hypothyroidism, prediabetes, hypercholesterolemia, vitamin D deficiency,, history of COVID-19 infection.  Patient presents today for preoperative risk ratification for upcoming left knee replacement.  She plans to have the surgery done prior to the end of November 2023.  She denies any chest pain or heart failure symptoms.  She is a prediabetic. No history of myocardial infarction/ACS/CVA.  She does stationary bike for 20 minutes on a regular basis along with walking for 20 minutes.  No change in physical endurance.  Her limiting factor is her left knee.  She has been cleared by her medical provider/PCP and was also requested to have cardiology input.  ALLERGIES: Allergies  Allergen Reactions   Dust Mite Extract Shortness Of Breath   Grass Extracts [Gramineae Pollens] Shortness Of Breath   Tree Extract Shortness Of Breath    Certain tree allergies    MEDICATION LIST PRIOR TO VISIT: Current Meds  Medication Sig   Acetaminophen (TYLENOL 8 HOUR PO) Take by mouth.   albuterol (PROVENTIL) (2.5 MG/3ML) 0.083% nebulizer solution Take 3 mLs (2.5 mg total) by nebulization every 6 (six) hours as needed for wheezing or shortness of breath.   albuterol (VENTOLIN HFA) 108 (90 Base) MCG/ACT inhaler Inhale 2 puffs into the lungs every 6 (six) hours as needed for wheezing.   amLODipine (NORVASC) 5 MG tablet Take 1 tablet (5 mg total) by mouth daily.   ARMOUR THYROID 15  MG tablet Take 15 mg by mouth daily.   BD PEN NEEDLE NANO 2ND GEN 32G X 4 MM MISC USE ONCE A WEEK   blood glucose meter kit and supplies KIT Dispense based on patient and insurance preference. Use up to four times daily as directed.   cetirizine (ZYRTEC) 10 MG tablet Take 10 mg by mouth daily.   clobetasol ointment (TEMOVATE) 3.41 % Apply 1 application topically 2 (two) times daily.   COLLAGEN PO Take 1 tablet by mouth daily at 6 (six) AM.   Estradiol 10 MCG TABS vaginal tablet Place 1 tablet (10 mcg total) vaginally 2 (two) times a week.   fluticasone (FLONASE) 50 MCG/ACT nasal spray    fluticasone-salmeterol (WIXELA INHUB) 250-50 MCG/ACT AEPB Inhale 1 puff into the lungs in the morning and at bedtime.   folic acid (FOLVITE) 937 MCG tablet Take 800 mcg by mouth daily.    methotrexate (RHEUMATREX) 2.5 MG tablet Take 20 mg by mouth every Sunday. Caution:Chemotherapy. Protect from light.   montelukast (SINGULAIR) 10 MG tablet Take 1 tablet (10 mg total) by mouth at bedtime.   naproxen (NAPROSYN) 500 MG tablet Take 1 tablet (500 mg total) by mouth 2 (two) times daily.   OMEPRAZOLE PO Take by mouth.   Secukinumab 150 MG/ML SOSY Inject 300 mg into the skin every 30 (thirty) days.   Vitamin D, Ergocalciferol, (DRISDOL) 1.25 MG (50000 UNIT) CAPS capsule Take 1 capsule (50,000 Units total) by mouth every 7 (seven) days.   WIXELA INHUB 250-50 MCG/DOSE AEPB Inhale 1 puff into the  lungs 2 (two) times daily.     PAST MEDICAL HISTORY: Past Medical History:  Diagnosis Date   Allergy    takes Zyrtec daily   Anemia    Anxiety    Arthritis    psorriatric arthritis   Asthma    Albuterol as needed as well as neb as needed   Back pain    Bilateral swelling of feet    Chronic back pain    pinched nerve   Constipation    Cough    Depression    Family history of adverse reaction to anesthesia    pts dad gets sick with anesthesia   Fibromyalgia    takes Effexor daily   Fibromyalgia    Gallbladder  problem    GERD (gastroesophageal reflux disease)    Headache    seasonal    History of bronchitis 2016   History of shingles    Hypertension    takes Amlodipine daily   Hypothyroid    Infertility, female    Joint pain    Joint pain    Joint swelling    Muscle spasm    takes Robaxin as needed   Osteoarthritis    Osteoarthritis    Palpitations    Pneumonia    hx of-10+ yrs ago   Prediabetes    Psoriasis    Psoriatic arthritis (HCC)    Shortness of breath    Vitamin D deficiency     PAST SURGICAL HISTORY: Past Surgical History:  Procedure Laterality Date   APPENDECTOMY     BIOPSY THYROID  2014   results benign   CHOLECYSTECTOMY  2008   DIAGNOSTIC LAPAROSCOPY     d/c endometriosis   DILATION AND CURETTAGE OF UTERUS     INSERTION OF MESH N/A 06/15/2016   Procedure: INSERTION OF MESH;  Surgeon: Autumn Messing III, MD;  Location: [place];  Service: General;  Laterality: N/A;   KNEE ARTHROSCOPY Bilateral 2011   MASS EXCISION Right 04/21/2014   Procedure: EXCISION OF RIGHT BACK MASS;  Surgeon: Coralie Keens, MD;  Location: [place];  Service: General;  Laterality: Right;   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  SOCIAL HISTORY:  The patient  reports that she has never smoked. She has never used smokeless tobacco. She reports current alcohol use. She reports that she does not use drugs.  REVIEW OF SYSTEMS: Review of Systems  Constitutional: Negative for  chills and fever.  HENT:  Negative for hoarse voice and nosebleeds.   Eyes:  Negative for discharge, double vision and pain.  Cardiovascular:  Negative for claudication, dyspnea on exertion, leg swelling, near-syncope, orthopnea, palpitations, paroxysmal nocturnal dyspnea and syncope.  Respiratory:  Negative for hemoptysis and shortness of breath.   Musculoskeletal:  Negative for muscle cramps and myalgias.  Gastrointestinal:  Negative for abdominal pain, constipation, diarrhea, hematemesis, hematochezia, melena, nausea and vomiting.  Neurological:  Negative for dizziness and light-headedness.    PHYSICAL EXAM:  05/15/2022    9:06 AM 05/09/2022    8:28 AM 03/08/2022    4:05 PM  Vitals with BMI  Height _0  _1  _2   Weight 212 lbs 3 oz 210 lbs 6 oz 208 lbs  BMI 36.41 16.96 78.93  Systolic 810 175 102  Diastolic 80 76 82  Pulse 83 74 88    Physical Exam  Constitutional: No distress.  Age appropriate, hemodynamically stable.   Neck: No JVD present.  Cardiovascular: Normal rate, regular rhythm, S1 normal, S2 normal, intact distal pulses and normal pulses. Exam reveals no gallop, no S3 and no S4.  No murmur heard. Pulses:      Dorsalis pedis pulses are 2+ on the right side and 2+ on the left side.       Posterior tibial pulses are 2+ on the right side and 2+ on the left side.  Pulmonary/Chest: Effort normal and breath sounds normal. No stridor. She has no wheezes. She has no rales.  Abdominal: Soft. Bowel sounds are normal. She exhibits no distension. There is no abdominal tenderness.  Musculoskeletal:        General: No edema.     Cervical back: Neck supple.  Neurological: She is alert and oriented to person, place, and time. She has intact cranial nerves (2-12).  Skin: Skin is warm and moist.     CARDIAC DATABASE: EKG: 05/15/2022: Normal sinus rhythm, 81 bpm, without underlying ischemia injury pattern.  Echocardiogram: 12/20/2020: Left ventricle cavity is normal in  size and wall thickness. Normal global wall motion. Normal LV systolic function with EF 61%. Normal diastolic filling pattern. No significant valvular abnormality. Normal right atrial pressure.   Stress Testing:  Lexiscan Sestamibi stress test 12/22/2020: Exercise nuclear stress test was performed using Bruce protocol. Patient reached 7 METS, and 82% of age predicted maximum heart rate. Exercise capacity was low. No chest pain reported. Heart rate and hemodynamic response were normal. Stress EKG revealed no ischemic changes. Reduced tracer uptake in inferior myocardium at rest and stress images likely due to breast attenuation, with imaging performed in sitting position. Stress LVEF 63% with normal myocardial thickening and wall motion. Low risk study.   Heart Catheterization: None   LABORATORY DATA:    Latest Ref Rng & Units 05/09/2022    8:52 AM 05/23/2021   12:50 PM 08/31/2020    7:50 AM  CBC  WBC 4.0 - 10.5 K/uL 6.7  10.4  8.5   Hemoglobin 12.0 - 15.0 g/dL 12.6  13.8  13.5   Hematocrit 36.0 - 46.0 % 38.5  40.8  42.5   Platelets 150.0 - 400.0 K/uL 263.0  298  281        Latest Ref Rng & Units 05/09/2022    8:52 AM 11/30/2021    8:58 AM 05/23/2021   12:50 PM  CMP  Glucose 70 - 99 mg/dL 117  100  91   BUN 6 - 23 mg/dL _3 Creatinine 0.40 - 1.20 mg/dL 0.60  0.52  0.54   Sodium 135 - 145 mEq/L 139  140  140   Potassium 3.5 - 5.1 mEq/L 3.7  4.1  4.0   Chloride 96 - 112 mEq/L 103  102  103   CO2 19 - 32 mEq/L _4 Calcium 8.4 - 10.5 mg/dL 9.3  9.3  9.1   Total Protein 6.0 - 8.3 g/dL 7.1  7.3  6.9   Total Bilirubin  0.2 - 1.2 mg/dL 0.7  0.3  0.3   Alkaline Phos 39 - 117 U/L 71  93  94   AST 0 - 37 U/L _0 ALT 0 - 35 U/L _1 Lipid Panel     Component Value Date/Time   CHOL 170 11/30/2021 0858   TRIG 88 11/30/2021 0858   HDL 48 11/30/2021 0858   CHOLHDL 3.8 05/23/2021 1250   CHOLHDL 3 01/22/2017 1132   VLDL 10.0 01/22/2017 1132    LDLCALC 106 (H) 11/30/2021 0858   LABVLDL 16 11/30/2021 0858    No components found for: "NTPROBNP" No results for input(s): "PROBNP" in the last 8760 hours. Recent Labs    05/09/22 0852  TSH 1.53    BMP Recent Labs    05/23/21 1250 11/30/21 0858 05/09/22 0852  NA 140 140 139  K 4.0 4.1 3.7  CL 103 102 103  CO2 _2 GLUCOSE 91 100* 117*  BUN _3 CREATININE 0.54* 0.52* 0.60  CALCIUM 9.1 9.3 9.3    HEMOGLOBIN A1C Lab Results  Component Value Date   HGBA1C 6.3 05/09/2022   External Labs: Collected: December 16, 2020. Creatinine 0.58 mg/dL. eGFR: >60 mL/min per 1.73 m Lipid profile: Total cholesterol 180, HDL 44, triglycerides 95, LDL calculated 119 Hemoglobin A1c: 5.6 Hemoglobin 13.1 g/dL, hematocrit 40.1%   IMPRESSION:    ICD-10-CM   1. Preop cardiovascular exam  Z01.810 EKG 12-Lead    2. Essential hypertension  I10     3. Hypercholesteremia  E78.00     4. Prediabetes  R73.03     5. Gastroesophageal reflux disease, unspecified whether esophagitis present  K21.9     6. Class 2 severe obesity due to excess calories with serious comorbidity and body mass index (BMI) of 36.0 to 36.9 in adult [facility])  E66.01    Z68.36        RECOMMENDATIONS: Natasha Lyons is a 61 y.o. female whose past medical history and cardiac risk factors include: Hypertension, GERD, obesity due to excess calories, asthma, hypothyroidism, prediabetes, hypercholesterolemia, vitamin D deficiency, history of COVID-19 infection.  Patient is here for preoperative risk ratification for upcoming left total knee replacement.  The date of surgery is to be determined.  Anticipated anesthesia is spinal.  Since last office she has not had any anginal discomfort or heart failure symptoms.  No hospitalizations or urgent care visits for cardiovascular symptoms.  Patient was restratified for right knee replacement in 2022 and she did not have any postoperative complications.  EKG today  is nonischemic.  Due to lifestyle changes patient is no longer diabetic and A1c is in the prediabetic range.  Overall functional capacity remains relatively stable.  She is doing a stationary bike at least for 20 minutes a day followed by walking for 20 minutes.  The rate limiting factor is osteoarthritis of the left knee.  Patient is considered to be at acceptable risk for upcoming noncardiac surgery.  I will see the patient back on as-needed basis or sooner if needed.  FINAL MEDICATION LIST END OF ENCOUNTER: No orders of the defined types were placed in this encounter.   There are no discontinued medications.    Current Outpatient Medications:    Acetaminophen (TYLENOL 8 HOUR PO), Take by mouth., Disp: , Rfl:    albuterol (PROVENTIL) (2.5 MG/3ML) 0.083% nebulizer solution, Take 3 mLs (2.5 mg total) by nebulization every  6 (six) hours as needed for wheezing or shortness of breath., Disp: 75 mL, Rfl: 0   albuterol (VENTOLIN HFA) 108 (90 Base) MCG/ACT inhaler, Inhale 2 puffs into the lungs every 6 (six) hours as needed for wheezing., Disp: , Rfl:    amLODipine (NORVASC) 5 MG tablet, Take 1 tablet (5 mg total) by mouth daily., Disp: 90 tablet, Rfl: 1   ARMOUR THYROID 15 MG tablet, Take 15 mg by mouth daily., Disp: , Rfl:    BD PEN NEEDLE NANO 2ND GEN 32G X 4 MM MISC, USE ONCE A WEEK, Disp: 100 each, Rfl: 0   blood glucose meter kit and supplies KIT, Dispense based on patient and insurance preference. Use up to four times daily as directed., Disp: 1 each, Rfl: 0   cetirizine (ZYRTEC) 10 MG tablet, Take 10 mg by mouth daily., Disp: , Rfl:    clobetasol ointment (TEMOVATE) 1.61 %, Apply 1 application topically 2 (two) times daily., Disp: , Rfl:    COLLAGEN PO, Take 1 tablet by mouth daily at 6 (six) AM., Disp: , Rfl:    Estradiol 10 MCG TABS vaginal tablet, Place 1 tablet (10 mcg total) vaginally 2 (two) times a week., Disp: 24 tablet, Rfl: 3   fluticasone (FLONASE) 50 MCG/ACT nasal spray, ,  Disp: , Rfl:    fluticasone-salmeterol (WIXELA INHUB) 250-50 MCG/ACT AEPB, Inhale 1 puff into the lungs in the morning and at bedtime., Disp: 14 each, Rfl: 7   folic acid (FOLVITE) 096 MCG tablet, Take 800 mcg by mouth daily. , Disp: , Rfl:    methotrexate (RHEUMATREX) 2.5 MG tablet, Take 20 mg by mouth every Sunday. Caution:Chemotherapy. Protect from light., Disp: , Rfl:    montelukast (SINGULAIR) 10 MG tablet, Take 1 tablet (10 mg total) by mouth at bedtime., Disp: 90 tablet, Rfl: 1   naproxen (NAPROSYN) 500 MG tablet, Take 1 tablet (500 mg total) by mouth 2 (two) times daily., Disp: 30 tablet, Rfl: 0   OMEPRAZOLE PO, Take by mouth., Disp: , Rfl:    Secukinumab 150 MG/ML SOSY, Inject 300 mg into the skin every 30 (thirty) days., Disp: , Rfl:    Vitamin D, Ergocalciferol, (DRISDOL) 1.25 MG (50000 UNIT) CAPS capsule, Take 1 capsule (50,000 Units total) by mouth every 7 (seven) days., Disp: 12 capsule, Rfl: 0   WIXELA INHUB 250-50 MCG/DOSE AEPB, Inhale 1 puff into the lungs 2 (two) times daily., Disp: , Rfl:   Orders Placed This Encounter  Procedures   EKG 12-Lead    There are no Patient Instructions on file for this visit.   --Continue cardiac medications as reconciled in final medication list. --Return if symptoms worsen or fail to improve. Or sooner if needed. --Continue follow-up with your primary care physician regarding the management of your other chronic comorbid conditions.  Patient's questions and concerns were addressed to her satisfaction. She voices understanding of the instructions provided during this encounter.   This note was created using a voice recognition software as a result there may be grammatical errors inadvertently enclosed that do not reflect the nature of this encounter. Every attempt is made to correct such errors.  Rex Kras, [place], [facility]  Pager: [phone] Office: [phone]

## 2022-07-04 ENCOUNTER — Other Ambulatory Visit: Payer: Self-pay | Admitting: Emergency Medicine

## 2022-07-11 ENCOUNTER — Ambulatory Visit: Payer: BC Managed Care – PPO | Admitting: Emergency Medicine

## 2022-07-13 ENCOUNTER — Ambulatory Visit: Payer: BC Managed Care – PPO | Admitting: Emergency Medicine

## 2022-08-08 ENCOUNTER — Encounter: Payer: Self-pay | Admitting: Emergency Medicine

## 2022-08-08 ENCOUNTER — Ambulatory Visit: Payer: BC Managed Care – PPO | Admitting: Emergency Medicine

## 2022-08-08 VITALS — BP 132/86 | HR 77 | Temp 98.2°F | Ht 64.0 in | Wt 212.5 lb

## 2022-08-08 DIAGNOSIS — I10 Essential (primary) hypertension: Secondary | ICD-10-CM

## 2022-08-08 DIAGNOSIS — K219 Gastro-esophageal reflux disease without esophagitis: Secondary | ICD-10-CM | POA: Diagnosis not present

## 2022-08-08 DIAGNOSIS — M797 Fibromyalgia: Secondary | ICD-10-CM

## 2022-08-08 DIAGNOSIS — E039 Hypothyroidism, unspecified: Secondary | ICD-10-CM | POA: Diagnosis not present

## 2022-08-08 DIAGNOSIS — F321 Major depressive disorder, single episode, moderate: Secondary | ICD-10-CM

## 2022-08-08 DIAGNOSIS — J302 Other seasonal allergic rhinitis: Secondary | ICD-10-CM

## 2022-08-08 DIAGNOSIS — L4050 Arthropathic psoriasis, unspecified: Secondary | ICD-10-CM

## 2022-08-08 DIAGNOSIS — M159 Polyosteoarthritis, unspecified: Secondary | ICD-10-CM

## 2022-08-08 DIAGNOSIS — J4520 Mild intermittent asthma, uncomplicated: Secondary | ICD-10-CM

## 2022-08-08 DIAGNOSIS — R7303 Prediabetes: Secondary | ICD-10-CM

## 2022-08-08 DIAGNOSIS — G894 Chronic pain syndrome: Secondary | ICD-10-CM

## 2022-08-08 DIAGNOSIS — Z6836 Body mass index (BMI) 36.0-36.9, adult: Secondary | ICD-10-CM

## 2022-08-08 MED ORDER — BUPROPION HCL ER (XL) 150 MG PO TB24: 150.0000 mg | ORAL_TABLET | Freq: Every day | ORAL | 3 refills | Status: AC

## 2022-08-08 NOTE — Assessment & Plan Note (Signed)
Stable.  Diet and nutrition discussed. Advised to decrease amount of daily carbohydrate intake and daily calories and increase amount of plant-based protein in her diet.

## 2022-08-08 NOTE — Assessment & Plan Note (Signed)
Chronic and contributing to chronic pain.

## 2022-08-08 NOTE — Assessment & Plan Note (Signed)
Stable and well-controlled on Wixela 1 puff twice a day

## 2022-08-08 NOTE — Assessment & Plan Note (Signed)
Diet and nutrition discussed.  Advised to decrease amount of daily carbohydrate intake and daily calories and increase amount of plant-based protein in her diet. 

## 2022-08-08 NOTE — Assessment & Plan Note (Signed)
Active and affecting quality of life. Needs referral to pain management clinic.

## 2022-08-08 NOTE — Assessment & Plan Note (Signed)
Stable and asymptomatic. Not presently taking omeprazole.

## 2022-08-08 NOTE — Assessment & Plan Note (Signed)
Currently active and affecting quality of life. Has taken Wellbutrin in the past with good results Sees therapist on a regular basis Recommend to start Wellbutrin 150 mg daily.

## 2022-08-08 NOTE — Assessment & Plan Note (Signed)
Well-controlled hypertension. BP Readings from Last 3 Encounters:  08/08/22 132/86  05/15/22 136/80  05/09/22 132/76  Continue amlodipine 5 mg daily. Cardiovascular risks associated with hypertension discussed. Dietary approaches to stop hypertension discussed.

## 2022-08-08 NOTE — Progress Notes (Signed)
Natasha Lyons 62 y.o.   Chief Complaint  Patient presents with   Annual Exam    Patient states she has been depressed, sinus pressure and drainage, arthritis issues     HISTORY OF PRESENT ILLNESS: This is a 62 y.o. female here for follow-up of multiple chronic medical conditions Has chronic pain.  Needs referral to pain management Sees rheumatologist on a regular basis Also sees orthopedist on a regular basis.  Has been having left knee issues status post surgical procedure last November Feels depressed Recently had sinus congestion and drainage.  Getting better. No other complaints or medical concerns today.  HPI   Prior to Admission medications   Medication Sig Start Date End Date Taking? Authorizing Provider  Acetaminophen (TYLENOL 8 HOUR PO) Take by mouth.   Yes [provider]  albuterol (PROVENTIL) (2.5 MG/3ML) 0.083% nebulizer solution Take 3 mLs (2.5 mg total) by nebulization every 6 (six) hours as needed for wheezing or shortness of breath. 02/01/22  Yes Paislie Tessler, Ines Bloomer, MD  albuterol (VENTOLIN HFA) 108 (90 Base) MCG/ACT inhaler Inhale 2 puffs into the lungs every 6 (six) hours as needed for wheezing.   Yes [provider]  amLODipine (NORVASC) 5 MG tablet Take 1 tablet (5 mg total) by mouth daily. 03/22/22  Yes Treniyah Lynn, Ines Bloomer, MD  ARMOUR THYROID 15 MG tablet Take 15 mg by mouth daily. 10/28/19  Yes [provider]  BD PEN NEEDLE NANO 2ND GEN 32G X 4 MM MISC USE ONCE A WEEK 04/26/20  Yes Briscoe Deutscher, DO  blood glucose meter kit and supplies KIT Dispense based on patient and insurance preference. Use up to four times daily as directed. 03/16/21  Yes Whitmire, Dawn W, FNP  CELEBREX 200 MG capsule Take 200 mg by mouth 2 (two) times daily. 08/03/22  Yes [provider]  cetirizine (ZYRTEC) 10 MG tablet Take 10 mg by mouth daily.   Yes [provider]  clobetasol ointment (TEMOVATE) 6.59 % Apply 1 application  topically 2 (two) times daily. 08/11/19  Yes [provider]  COLLAGEN PO Take 1 tablet by mouth daily at 6 (six) AM.   Yes [provider]  Estradiol 10 MCG TABS vaginal tablet Place 1 tablet (10 mcg total) vaginally 2 (two) times a week. 03/09/22  Yes Salvadore Dom, MD  fluticasone Asencion Islam) 50 MCG/ACT nasal spray  05/12/18  Yes [provider]  fluticasone-salmeterol (WIXELA INHUB) 250-50 MCG/ACT AEPB Inhale 1 puff into the lungs in the morning and at bedtime. 01/04/22  Yes Ellese Julius, Ines Bloomer, MD  folic acid (FOLVITE) 935 MCG tablet Take 800 mcg by mouth daily.    Yes [provider]  methotrexate (RHEUMATREX) 2.5 MG tablet Take 20 mg by mouth every Sunday. Caution:Chemotherapy. Protect from light.   Yes [provider]  montelukast (SINGULAIR) 10 MG tablet TAKE 1 TABLET BY MOUTH EVERYDAY AT BEDTIME 07/04/22  Yes Hasel Janish, Ines Bloomer, MD  naproxen (NAPROSYN) 500 MG tablet Take 1 tablet (500 mg total) by mouth 2 (two) times daily. 08/08/17  Yes Rancour, Annie Main, MD  OMEPRAZOLE PO Take by mouth.   Yes [provider]  Secukinumab 150 MG/ML SOSY Inject 300 mg into the skin every 30 (thirty) days.   Yes [provider]  Vitamin D, Ergocalciferol, (DRISDOL) 1.25 MG (50000 UNIT) CAPS capsule Take 1 capsule (50,000 Units total) by mouth every 7 (seven) days. 09/08/21  Yes Abby Potash, PA-C  WIXELA INHUB 250-50 MCG/DOSE AEPB Inhale 1 puff  into the lungs 2 (two) times daily. 10/10/19  Yes [provider]    Allergies  Allergen Reactions   Dust Mite Extract Shortness Of Breath   Grass Extracts [Gramineae Pollens] Shortness Of Breath   Tree Extract Shortness Of Breath    Certain tree allergies    Patient Active Problem List   Diagnosis Date Noted   Seasonal allergies 01/04/2022   Primary osteoarthritis involving multiple joints 01/04/2022   Chronic idiopathic constipation 09/21/2021   Gastro-esophageal reflux disease  without esophagitis 09/21/2021   Cervical spondylosis without myelopathy 03/07/2021   Joint pain 03/07/2021   Osteoarthritis 03/07/2021   Fibromyalgia 03/07/2021   H/O total knee replacement, 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   Depression 04/05/2020   Other specified anxiety disorders 04/05/2020   Chronic pain of right knee 04/05/2020   Diabetes mellitus ([place]) 02/09/2020   History of endometriosis 02/06/2020   Osteoporosis 01/06/2020   Nonallopathic lesion of cervical region 04/24/2016   Nonallopathic lesion of rib cage 04/24/2016   SI (sacroiliac) joint dysfunction 03/21/2016   Nonallopathic lesion of sacral region 03/21/2016   Nonallopathic lesion of lumbosacral region 03/21/2016   Nonallopathic lesion of thoracic region 03/21/2016   Rheumatoid bursitis, left shoulder ([place]) 08/16/2015   Psoriatic arthritis ([place]) 04/07/2015   Chronic pain syndrome 04/07/2015   Psoriasis 08/10/2013   Vitamin D deficiency 08/10/2013   Thyroid nodule 05/02/2013   Essential hypertension 03/06/2013   Acquired hypothyroidism 03/06/2013   Extrinsic asthma, mild, intermittent 03/06/2013   Class 2 severe obesity with serious comorbidity and body mass index (BMI) of 36.0 to 36.9 in adult [employer]) 03/06/2013    Past Medical History:  Diagnosis Date   Allergy    takes Zyrtec daily   Anemia    Anxiety    Arthritis    psorriatric arthritis   Asthma    Albuterol as needed as well as neb as needed   Back pain    Bilateral swelling of feet    Chronic back pain    pinched nerve   Constipation    Cough    Depression    Family history of adverse reaction to anesthesia    pts dad gets sick with anesthesia   Fibromyalgia    takes Effexor daily   Fibromyalgia    Gallbladder problem    GERD (gastroesophageal reflux disease)    Headache    seasonal    History of bronchitis 2016   History of shingles    Hypertension    takes Amlodipine daily   Hypothyroid    Infertility, female    Joint pain    Joint pain    Joint  swelling    Muscle spasm    takes Robaxin as needed   Osteoarthritis    Osteoarthritis    Palpitations    Pneumonia    hx of-10+ yrs ago   Prediabetes    Psoriasis    Psoriatic arthritis ([place])    Shortness of breath    Vitamin D deficiency     Past Surgical History:  Procedure Laterality Date   APPENDECTOMY     BIOPSY THYROID  2014   results benign   CHOLECYSTECTOMY  2008   DIAGNOSTIC LAPAROSCOPY     d/c endometriosis   DILATION AND CURETTAGE OF UTERUS     INSERTION OF MESH N/A 06/15/2016   Procedure: INSERTION OF MESH;  Surgeon: Autumn Messing III, MD;  Location: [facility];  Service: General;  Laterality: N/A;   KNEE ARTHROSCOPY Bilateral 2011   MASS  EXCISION Right 04/21/2014   Procedure: EXCISION OF RIGHT BACK MASS;  Surgeon: Coralie Keens, MD;  Location: [facility];  Service: General;  Laterality: Right;   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   TONSILLECTOMY     VENTRAL HERNIA REPAIR  06/15/2016   VENTRAL HERNIA REPAIR N/A 06/15/2016   Procedure: LAPAROSCOPIC VENTRAL HERNIA WITH MESH;  Surgeon: Autumn Messing III, MD;  Location: [facility];  Service: General;  Laterality: N/A;    Social History   Socioeconomic History   Marital status: Married    Spouse name: Not on file   Number of children: 1   Years of education: Not on file   Highest education level: Not on file  Occupational History   Not on file  Tobacco Use   Smoking status: Never   Smokeless tobacco: Never  Vaping Use   Vaping Use: Never used  Substance and Sexual Activity   Alcohol use: Yes    Comment: occ   Drug use: No   Sexual activity: Not Currently    Birth control/protection: Post-menopausal  Other Topics Concern   Not on file  Social History Narrative   Not on file   Social Determinants of Health   Financial Resource Strain: Not on file  Food Insecurity: Not on file  Transportation Needs: Not on file  Physical Activity: Not on file  Stress: Not on file  Social Connections: Not  on file  Intimate Partner Violence: Not on file    Family History  Problem Relation Age of Onset   Diabetes Mother    Hypertension Mother    Hyperlipidemia Mother    Heart disease Mother    Stroke Mother    Kidney disease Mother    Thyroid disease Mother    Obesity Mother    Hypertension Father      Review of Systems  Constitutional: Negative.  Negative for chills and fever.  HENT:  Positive for congestion. Negative for sore throat.   Respiratory: Negative.  Negative for cough and shortness of breath.   Cardiovascular: Negative.  Negative for chest pain and palpitations.  Gastrointestinal: Negative.  Negative for abdominal pain, diarrhea, nausea and vomiting.  Genitourinary: Negative.  Negative for dysuria and hematuria.  Musculoskeletal:  Positive for joint pain.  Skin: Negative.  Negative for rash.  Neurological: Negative.  Negative for dizziness and headaches.  Psychiatric/Behavioral:  Positive for depression.   All other systems reviewed and are negative.  Today's Vitals   08/08/22 0955  BP: 132/86  Pulse: 77  Temp: 98.2 F (36.8 C)  TempSrc: Oral  SpO2: 93%  Weight: 212 lb 8 oz (96.4 kg)  Height: '5\' 4"'$  (1.626 m)   Body mass index is 36.48 kg/m.   Physical Exam Vitals reviewed.  Constitutional:      Appearance: Normal appearance.  HENT:     Head: Normocephalic.     Right Ear: Tympanic membrane, ear canal and external ear normal.     Left Ear: Tympanic membrane, ear canal and external ear normal.     Mouth/Throat:     Mouth: Mucous membranes are moist.     Pharynx: Oropharynx is clear.  Eyes:     Extraocular Movements: Extraocular movements intact.     Conjunctiva/sclera: Conjunctivae normal.     Pupils: Pupils are equal, round, and reactive to light.  Cardiovascular:     Rate and Rhythm: Normal rate and regular rhythm.     Pulses: Normal pulses.     Heart sounds: Normal  heart sounds.  Pulmonary:     Effort: Pulmonary effort is normal.     Breath  sounds: Normal breath sounds.  Musculoskeletal:     Cervical back: No tenderness.  Lymphadenopathy:     Cervical: No cervical adenopathy.  Skin:    General: Skin is warm and dry.  Neurological:     General: No focal deficit present.     Mental Status: She is alert and oriented to person, place, and time.  Psychiatric:        Mood and Affect: Mood normal.        Behavior: Behavior normal.      ASSESSMENT & PLAN: A total of 47 minutes was spent with the patient and counseling/coordination of care regarding preparing for this visit, review of most recent office visit notes, review of multiple chronic medical conditions under treatment, review of all medications, diagnosis of acute depression and need for treatment with medication, need for follow-up with her therapist, need for chronic pain management evaluation, education on nutrition, prognosis, documentation, and need for follow-up.  Problem List Items Addressed This Visit       Cardiovascular and Mediastinum   Essential hypertension - Primary    Well-controlled hypertension. BP Readings from Last 3 Encounters:  08/08/22 132/86  05/15/22 136/80  05/09/22 132/76  Continue amlodipine 5 mg daily. Cardiovascular risks associated with hypertension discussed. Dietary approaches to stop hypertension discussed.         Respiratory   Extrinsic asthma, mild, intermittent    Stable and well-controlled on Wixela 1 puff twice a day        Digestive   Gastro-esophageal reflux disease without esophagitis    Stable and asymptomatic. Not presently taking omeprazole.        Endocrine   Acquired hypothyroidism    Clinically euthyroid.  Continue Armour Thyroid 15 mg daily        Musculoskeletal and Integument   Psoriatic arthritis [facility])    Sees rheumatologist on a regular basis On Rheumatrex and secukinumab.  Medications handled by their office      Relevant Medications   CELEBREX 200 MG capsule   Primary osteoarthritis  involving multiple joints    Contributing to chronic pain syndrome. Mostly left knee pain. Presently on Celebrex 200 mg twice a day.  Not taking any other NSAIDs at present time      Relevant Medications   CELEBREX 200 MG capsule     Other   Class 2 severe obesity with serious comorbidity and body mass index (BMI) of 36.0 to 36.9 in adult [facility])    Diet and nutrition discussed.  Advised to decrease amount of daily carbohydrate intake and daily calories and increase amount of plant-based protein in her diet.      Chronic pain syndrome    Active and affecting quality of life. Needs referral to pain management clinic.      Relevant Medications   CELEBREX 200 MG capsule   buPROPion (WELLBUTRIN XL) 150 MG 24 hr tablet   Other Relevant Orders   Ambulatory referral to Pain Clinic   Prediabetes    Stable.  Diet and nutrition discussed. Advised to decrease amount of daily carbohydrate intake and daily calories and increase amount of plant-based protein in her diet.      Depression    Currently active and affecting quality of life. Has taken Wellbutrin in the past with good results Sees therapist on a regular basis Recommend to start Wellbutrin 150 mg daily.  Relevant Medications   buPROPion (WELLBUTRIN XL) 150 MG 24 hr tablet   Fibromyalgia    Chronic and contributing to chronic pain.      Relevant Medications   CELEBREX 200 MG capsule   buPROPion (WELLBUTRIN XL) 150 MG 24 hr tablet   Seasonal allergies    Contributing to chronic congestion and occasional drainage. Advised to use saline nasal sprays frequently during the day      Patient Instructions  Health Maintenance, Female Adopting a healthy lifestyle and getting preventive care are important in promoting health and wellness. Ask your health care provider about: The right schedule for you to have regular tests and exams. Things you can do on your own to prevent diseases and keep yourself healthy. What should I  know about diet, weight, and exercise? Eat a healthy diet  Eat a diet that includes plenty of vegetables, fruits, low-fat dairy products, and lean protein. Do not eat a lot of foods that are high in solid fats, added sugars, or sodium. Maintain a healthy weight Body mass index (BMI) is used to identify weight problems. It estimates body fat based on height and weight. Your health care provider can help determine your BMI and help you achieve or maintain a healthy weight. Get regular exercise Get regular exercise. This is one of the most important things you can do for your health. Most adults should: Exercise for at least 150 minutes each week. The exercise should increase your heart rate and make you sweat (moderate-intensity exercise). Do strengthening exercises at least twice a week. This is in addition to the moderate-intensity exercise. Spend less time sitting. Even light physical activity can be beneficial. Watch cholesterol and blood lipids Have your blood tested for lipids and cholesterol at 60 years of age, then have this test every 5 years. Have your cholesterol levels checked more often if: Your lipid or cholesterol levels are high. You are older than 60 years of age. You are at high risk for heart disease. What should I know about cancer screening? Depending on your health history and family history, you may need to have cancer screening at various ages. This may include screening for: Breast cancer. Cervical cancer. Colorectal cancer. Skin cancer. Lung cancer. What should I know about heart disease, diabetes, and high blood pressure? Blood pressure and heart disease High blood pressure causes heart disease and increases the risk of stroke. This is more likely to develop in people who have high blood pressure readings or are overweight. Have your blood pressure checked: Every 3-5 years if you are 19-73 years of age. Every year if you are 60 years old or  older. Diabetes Have regular diabetes screenings. This checks your fasting blood sugar level. Have the screening done: Once every three years after age 72 if you are at a normal weight and have a low risk for diabetes. More often and at a younger age if you are overweight or have a high risk for diabetes. What should I know about preventing infection? Hepatitis B If you have a higher risk for hepatitis B, you should be screened for this virus. Talk with your health care provider to find out if you are at risk for hepatitis B infection. Hepatitis C Testing is recommended for: Everyone born from [birth year] through 1963. Anyone with known risk factors for hepatitis C. Sexually transmitted infections (STIs) Get screened for STIs, including gonorrhea and chlamydia, if: You are sexually active and are younger than 60 years of age. You are  older than 60 years of age and your health care provider tells you that you are at risk for this type of infection. Your sexual activity has changed since you were last screened, and you are at increased risk for chlamydia or gonorrhea. Ask your health care provider if you are at risk. Ask your health care provider about whether you are at high risk for HIV. Your health care provider may recommend a prescription medicine to help prevent HIV infection. If you choose to take medicine to prevent HIV, you should first get tested for HIV. You should then be tested every 3 months for as long as you are taking the medicine. Pregnancy If you are about to stop having your period (premenopausal) and you may become pregnant, seek counseling before you get pregnant. Take 400 to 800 micrograms (mcg) of folic acid every day if you become pregnant. Ask for birth control (contraception) if you want to prevent pregnancy. Osteoporosis and menopause Osteoporosis is a disease in which the bones lose minerals and strength with aging. This can result in bone fractures. If you are 65 years old  or older, or if you are at risk for osteoporosis and fractures, ask your health care provider if you should: Be screened for bone loss. Take a calcium or vitamin D supplement to lower your risk of fractures. Be given hormone replacement therapy (HRT) to treat symptoms of menopause. Follow these instructions at home: Alcohol use Do not drink alcohol if: Your health care provider tells you not to drink. You are pregnant, may be pregnant, or are planning to become pregnant. If you drink alcohol: Limit how much you have to: 0-1 drink a day. Know how much alcohol is in your drink. In the [place] one drink equals one 12 oz bottle of beer (355 mL), one 5 oz glass of wine (148 mL), or one 1 oz glass of hard liquor (44 mL). Lifestyle Do not use any products that contain nicotine or tobacco. These products include cigarettes, chewing tobacco, and vaping devices, such as e-cigarettes. If you need help quitting, ask your health care provider. Do not use street drugs. Do not share needles. Ask your health care provider for help if you need support or information about quitting drugs. General instructions Schedule regular health, dental, and eye exams. Stay current with your vaccines. Tell your health care provider if: You often feel depressed. You have ever been abused or do not feel safe at home. Summary Adopting a healthy lifestyle and getting preventive care are important in promoting health and wellness. Follow your health care provider's instructions about healthy diet, exercising, and getting tested or screened for diseases. Follow your health care provider's instructions on monitoring your cholesterol and blood pressure. This information is not intended to replace advice given to you by your health care provider. Make sure you discuss any questions you have with your health care provider. Document Revised: 11/15/2020 Document Reviewed: 11/15/2020 Elsevier Patient Education  [place], MD [facility]

## 2022-08-08 NOTE — Assessment & Plan Note (Signed)
Contributing to chronic pain syndrome. Mostly left knee pain. Presently on Celebrex 200 mg twice a day.  Not taking any other NSAIDs at present time

## 2022-08-08 NOTE — Assessment & Plan Note (Signed)
Sees rheumatologist on a regular basis On Rheumatrex and secukinumab.  Medications handled by their office

## 2022-08-08 NOTE — Assessment & Plan Note (Signed)
Contributing to chronic congestion and occasional drainage. Advised to use saline nasal sprays frequently during the day

## 2022-08-08 NOTE — Assessment & Plan Note (Signed)
Clinically euthyroid.  Continue Armour Thyroid 15 mg daily

## 2022-08-08 NOTE — Patient Instructions (Signed)

## 2022-08-11 ENCOUNTER — Encounter: Payer: Self-pay | Admitting: Physical Medicine & Rehabilitation

## 2022-08-29 ENCOUNTER — Encounter: Payer: Self-pay | Admitting: Physical Medicine & Rehabilitation

## 2022-08-29 ENCOUNTER — Encounter
Payer: BC Managed Care – PPO | Attending: Physical Medicine & Rehabilitation | Admitting: Physical Medicine & Rehabilitation

## 2022-08-29 VITALS — BP 126/85 | HR 72 | Ht 64.0 in | Wt 218.2 lb

## 2022-08-29 DIAGNOSIS — M545 Low back pain, unspecified: Secondary | ICD-10-CM | POA: Diagnosis present

## 2022-08-29 DIAGNOSIS — L4050 Arthropathic psoriasis, unspecified: Secondary | ICD-10-CM | POA: Diagnosis present

## 2022-08-29 DIAGNOSIS — M47812 Spondylosis without myelopathy or radiculopathy, cervical region: Secondary | ICD-10-CM

## 2022-08-29 DIAGNOSIS — G8929 Other chronic pain: Secondary | ICD-10-CM

## 2022-08-29 DIAGNOSIS — F39 Unspecified mood [affective] disorder: Secondary | ICD-10-CM | POA: Diagnosis present

## 2022-08-29 DIAGNOSIS — M797 Fibromyalgia: Secondary | ICD-10-CM

## 2022-08-29 MED ORDER — PREGABALIN 50 MG PO CAPS: 50.0000 mg | ORAL_CAPSULE | Freq: Two times a day (BID) | ORAL | 3 refills | Status: DC

## 2022-08-29 NOTE — Progress Notes (Addendum)
Subjective:    Patient ID: Natasha Lyons, female    DOB: 01-19-1961, 62 y.o.   MRN: [id number]  HPI    Natasha Lyons is a 62 y.o. year old female  who  has a past medical history of Allergy, Anemia, Anxiety, Arthritis, Asthma, Back pain, Bilateral swelling of feet, Chronic back pain, Constipation, Cough, Depression, Family history of adverse reaction to anesthesia, Fibromyalgia, Fibromyalgia, Gallbladder problem, GERD (gastroesophageal reflux disease), Headache, History of bronchitis (2016), History of shingles, Hypertension, Hypothyroid, Infertility, female, Joint pain, Joint pain, Joint swelling, Muscle spasm, Osteoarthritis, Osteoarthritis, Palpitations, Pneumonia, Prediabetes, Psoriasis, Psoriatic arthritis ([place]), Shortness of breath, and Vitamin D deficiency.   They are presenting to PM&R clinic as a new patient for pain management evaluation.  Natasha Lyons reports a history of psoriatic arthritis.  She has a lot of pain in her shoulders and back..  She has pain that radiates down her legs pain began about 20 years ago but has been worsening over time.  Her rheumatologist has also diagnosed with fibromyalgia.  She has had a prior TKR on the left and also on the right knee.  Pain is worsened by cold.  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  Her activity is decreased since her knee surgeries.  She reports some weight gain due to decreased activity.  Mood is often decreased.  She sleeps poorly most nights.  Has issues with occasional constipation.   No red flags for back pain endorsed in Hx or ROS, saddle anesthesia, loss of bowel or bladder continence, new weakness, new numbness/tingling, and pain waking up at nighttime.  Medications tried: Topical medications voltaren gel Nsaids Naproxen- did not help, celebrex- minimal help, Voltaren gel helps Tylenol -takes all the time, helps a little Gabapentin- did not help Lyrica-appears she  had a low-dose of Lyrica in the past to see patient this TCAs  -amitriptyline made her sleepy SNRIs  -Has not tried  Other  Tizanidine - does help a little, helps her sleep  Other treatments: PT/OT - after surgery, helped Accupuncture/chiropractor/massage - chiro treatment helped lower back, didn't feel comfortable on neck TENs unit - Used it a long time ago Injections -  Cortisone injection in back- helped for a long time  Surgery - TKA- both sides Other - heating bad helps    Pain Inventory Average Pain 4 Pain Right Now 4 My pain is constant  In the last 24 hours, has pain interfered with the following? General activity 6 Relation with others 6 Enjoyment of life 6 What TIME of day is your pain at its worst? morning  and night Sleep (in general) Poor  Pain is worse with: walking, sitting, standing, and some activites Pain improves with: rest, heat/ice, and medication Relief from Meds: 5  ability to climb steps?  yes do you drive?  yes Do you have any goals in this area?  no  employed # of hrs/week education  weakness tingling  Any changes since last visit?  no  Any changes since last visit?  no    Family History  Problem Relation Age of Onset   Diabetes Mother    Hypertension Mother    Hyperlipidemia Mother    Heart disease Mother    Stroke Mother    Kidney disease Mother    Thyroid disease Mother    Obesity Mother    Hypertension Father    Social History  Socioeconomic History   Marital status: Married    Spouse name: Not on file   Number of children: 1   Years of education: Not on file   Highest education level: Not on file  Occupational History   Not on file  Tobacco Use   Smoking status: Never   Smokeless tobacco: Never  Vaping Use   Vaping Use: Never used  Substance and Sexual Activity   Alcohol use: Yes    Comment: occ   Drug use: No   Sexual activity: Not Currently    Birth control/protection: Post-menopausal  Other Topics  Concern   Not on file  Social History Narrative   Not on file   Social Determinants of Health   Financial Resource Strain: Not on file  Food Insecurity: Not on file  Transportation Needs: Not on file  Physical Activity: Not on file  Stress: Not on file  Social Connections: Not on file   Past Surgical History:  Procedure Laterality Date   APPENDECTOMY     BIOPSY THYROID  2014   results benign   CHOLECYSTECTOMY  2008   DIAGNOSTIC LAPAROSCOPY     d/c endometriosis   DILATION AND CURETTAGE OF UTERUS     INSERTION OF MESH N/A 06/15/2016   Procedure: INSERTION OF MESH;  Surgeon: Autumn Messing III, MD;  Location: [place];  Service: General;  Laterality: N/A;   KNEE ARTHROSCOPY Bilateral 2011   MASS EXCISION Right 04/21/2014   Procedure: EXCISION OF RIGHT BACK MASS;  Surgeon: Coralie Keens, MD;  Location: [place];  Service: General;  Laterality: Right;   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   TONSILLECTOMY     VENTRAL HERNIA REPAIR  06/15/2016   VENTRAL HERNIA REPAIR N/A 06/15/2016   Procedure: LAPAROSCOPIC VENTRAL HERNIA WITH MESH;  Surgeon: Autumn Messing III, MD;  Location: [place];  Service: General;  Laterality: N/A;   Past Medical History:  Diagnosis Date   Allergy    takes Zyrtec daily   Anemia    Anxiety    Arthritis    psorriatric arthritis   Asthma    Albuterol as needed as well as neb as needed   Back pain    Bilateral swelling of feet    Chronic back pain    pinched nerve   Constipation    Cough    Depression    Family history of adverse reaction to anesthesia    pts dad gets sick with anesthesia   Fibromyalgia    takes Effexor daily   Fibromyalgia    Gallbladder problem    GERD (gastroesophageal reflux disease)    Headache    seasonal    History of bronchitis 2016   History of shingles    Hypertension    takes Amlodipine daily   Hypothyroid    Infertility, female    Joint pain    Joint pain    Joint swelling    Muscle spasm     takes Robaxin as needed   Osteoarthritis    Osteoarthritis    Palpitations    Pneumonia    hx of-10+ yrs ago   Prediabetes    Psoriasis    Psoriatic arthritis (HCC)    Shortness of breath    Vitamin D deficiency    BP 126/85   Pulse 72   Ht '5\' 4"'$  (1.626 m)   Wt 218 lb 3.2 oz (99 kg)   LMP 07/10/2018   SpO2 98%   BMI 37.45 kg/m  Opioid Risk Score:   Fall Risk Score:  `1  Depression screen [facility] 2/9     08/29/2022    9:20 AM 08/08/2022    9:58 AM 05/09/2022    8:33 AM 01/04/2022    3:40 PM 09/21/2021    3:21 PM 12/01/2019   12:10 PM  Depression screen PHQ 2/9  Decreased Interest 1 0 0 0 0 2  Down, Depressed, Hopeless 1 0 0 0 0 1  PHQ - 2 Score 2 0 0 0 0 3  Altered sleeping 1     1  Tired, decreased energy 1     3  Change in appetite 0     2  Feeling bad or failure about yourself  0     2  Trouble concentrating 0     2  Moving slowly or fidgety/restless 0     0  Suicidal thoughts 0     0  PHQ-9 Score 4     13  Difficult doing work/chores Not difficult at all     Not difficult at all      Review of Systems  Musculoskeletal:  Positive for back pain and neck pain.  All other systems reviewed and are negative.      Objective:   Physical Exam  Gen: no distress, normal appearing HEENT: oral mucosa pink and moist, NCAT Cardio: Reg rate Chest: normal effort, normal rate of breathing Abd: soft, non-distended Ext: no edema Psych: pleasant, normal affect Skin: intact Neuro: Alert and oriented, follows commands, cranial nerves II through XII grossly intact, normal speech and language Strength 5 out of 5 in bilateral upper shoulder abduction, elbow flexion extension, finger flexion Strength 5 out of 5 in bilateral hip flexion, knee extension, ankle PF and DF Sensation tact light touch in all 4 extremities Musculoskeletal:  SLR negative bilaterally FABER negative Spurling's negative No ankle clonus No dysmetria or ataxia noted  Tenderness throughout paraspinal  muscles C-spine, T-spine, L-spine, periscapular muscles, QL Tenderness to palpation bilateral greater troches plantars, bilateral shoulders Gait appears to be normal, normal stride length and cadence no significant instability Reports her left wrist and knee felt achy with palpation Tenderness at bilateral ankles No abnormal tone noted  MRI L spine 12/23/21 IMPRESSION: Multilevel degenerative changes as detailed above. No high-grade canal stenosis. Facet arthropathy with prominent joint effusions at L4-L5. There is increased mild to moderate foraminal narrowing at L4-L5. Similar moderate left foraminal narrowing at L5-S1 and potential displacement of the extraforaminal left L5 nerve root at this level.  MRI C spine 11/24/21 IMPRESSION: Multilevel degenerative changes as detailed above. No high-grade canal stenosis. Facet arthropathy with prominent joint effusions at L4-L5. There is increased mild to moderate foraminal narrowing at L4-L5. Similar moderate left foraminal narrowing at L5-S1 and potential displacement of the extraforaminal left L5 nerve root at this level.     Assessment & Plan:  Assessment and Plan: Natasha Lyons is a 62 y.o. year old female  who  has a past medical history of Allergy, Anemia, Anxiety, Arthritis, Asthma, Back pain, Bilateral swelling of feet, Chronic back pain, Constipation, Cough, Depression, Family history of adverse reaction to anesthesia, Fibromyalgia, Fibromyalgia, Gallbladder problem, GERD (gastroesophageal reflux disease), Headache, History of bronchitis (2016), History of shingles, Hypertension, Hypothyroid, Infertility, female, Joint pain, Joint pain, Joint swelling, Muscle spasm, Osteoarthritis, Osteoarthritis, Palpitations, Pneumonia, Prediabetes, Psoriasis, Psoriatic arthritis ([place]), Shortness of breath, and Vitamin D deficiency.   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  Consider repeat  Psoriatic arthritis -Continue follow-up with rheumatology, she is currently on methotrexate  Mood disorder -She is currently on bupropion -She may have additional benefit with SNRI   Addendum- 3/1, pt would not like to try lyrica due to concern for wt gain. DC lyrica Will start Cymbalta '30mg'$  daily

## 2022-08-30 ENCOUNTER — Other Ambulatory Visit: Payer: Self-pay | Admitting: Obstetrics and Gynecology

## 2022-08-30 DIAGNOSIS — Z1231 Encounter for screening mammogram for malignant neoplasm of breast: Secondary | ICD-10-CM

## 2022-09-05 ENCOUNTER — Ambulatory Visit: Payer: BC Managed Care – PPO

## 2022-09-06 ENCOUNTER — Ambulatory Visit
Admission: RE | Admit: 2022-09-06 | Discharge: 2022-09-06 | Disposition: A | Discharge status: Home / Self Care | Payer: BC Managed Care – PPO | Source: Ambulatory Visit | Attending: Obstetrics and Gynecology | Admitting: Obstetrics and Gynecology

## 2022-09-06 DIAGNOSIS — Z1231 Encounter for screening mammogram for malignant neoplasm of breast: Secondary | ICD-10-CM

## 2022-09-07 ENCOUNTER — Ambulatory Visit: Payer: BC Managed Care – PPO

## 2022-09-08 MED ORDER — DULOXETINE HCL 30 MG PO CPEP: 30.0000 mg | ORAL_CAPSULE | Freq: Every day | ORAL | 2 refills | Status: DC

## 2022-09-08 NOTE — Addendum Note (Signed)
Addended by: Jennye Boroughs on: 09/08/2022 04:44 PM   Modules accepted: Orders

## 2022-09-23 ENCOUNTER — Other Ambulatory Visit: Payer: Self-pay | Admitting: Emergency Medicine

## 2022-10-26 ENCOUNTER — Ambulatory Visit: Payer: BC Managed Care – PPO | Admitting: Physical Medicine & Rehabilitation

## 2022-10-31 ENCOUNTER — Ambulatory Visit: Payer: BC Managed Care – PPO | Admitting: Emergency Medicine

## 2022-11-02 ENCOUNTER — Encounter
Payer: BC Managed Care – PPO | Attending: Physical Medicine & Rehabilitation | Admitting: Physical Medicine & Rehabilitation

## 2022-11-02 ENCOUNTER — Encounter: Payer: Self-pay | Admitting: Physical Medicine & Rehabilitation

## 2022-11-02 VITALS — BP 149/75 | HR 66 | Ht 64.0 in | Wt 214.0 lb

## 2022-11-02 DIAGNOSIS — L4050 Arthropathic psoriasis, unspecified: Secondary | ICD-10-CM | POA: Diagnosis not present

## 2022-11-02 DIAGNOSIS — M47812 Spondylosis without myelopathy or radiculopathy, cervical region: Secondary | ICD-10-CM

## 2022-11-02 DIAGNOSIS — F39 Unspecified mood [affective] disorder: Secondary | ICD-10-CM | POA: Insufficient documentation

## 2022-11-02 DIAGNOSIS — M797 Fibromyalgia: Secondary | ICD-10-CM | POA: Diagnosis not present

## 2022-11-02 NOTE — Progress Notes (Signed)
Subjective:    Patient ID: Natasha Lyons, female    DOB: [birth year], 62 y.o.   MRN: [id number]  HPI     Natasha Lyons is a 62 y.o. year old female  who  has a past medical history of Allergy, Anemia, Anxiety, Arthritis, Asthma, Back pain, Bilateral swelling of feet, Chronic back pain, Constipation, Cough, Depression, Family history of adverse reaction to anesthesia, Fibromyalgia, Fibromyalgia, Gallbladder problem, GERD (gastroesophageal reflux disease), Headache, History of bronchitis (2016), History of shingles, Hypertension, Hypothyroid, Infertility, female, Joint pain, Joint pain, Joint swelling, Muscle spasm, Osteoarthritis, Osteoarthritis, Palpitations, Pneumonia, Prediabetes, Psoriasis, Psoriatic arthritis (HCC), Shortness of breath, and Vitamin D deficiency.   They are presenting to PM&R clinic as a new patient for pain management evaluation.  Natasha Lyons reports a history of psoriatic arthritis.  She has a lot of pain in her shoulders and back..  She has pain that radiates down her legs pain began about 20 years ago but has been worsening over time.  Her rheumatologist has also diagnosed with fibromyalgia.  She has had a prior TKR on the left and also on the right knee.  Pain is worsened by cold.  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  Her activity is decreased since her knee surgeries.  She reports some weight gain due to decreased activity.  Mood is often decreased.  She sleeps poorly most nights.  Has issues with occasional constipation.     No red flags for back pain endorsed in Hx or ROS, saddle anesthesia, loss of bowel or bladder continence, new weakness, new numbness/tingling, and pain waking up at nighttime.   Medications tried: Topical medications voltaren gel Nsaids Naproxen- did not help, celebrex- minimal help, Voltaren gel helps Tylenol -takes all the time, helps a little Gabapentin- did not help Lyrica-appears  she had a low-dose of Lyrica in the past to see patient this TCAs  -amitriptyline made her sleepy SNRIs  -Has not tried  Other  Tizanidine - does help a little, helps her sleep   Other treatments: PT/OT - after surgery, helped Accupuncture/chiropractor/massage - chiro treatment helped lower back, didn't feel comfortable on neck TENs unit - Used it a long time ago Injections -  Cortisone injection in back- helped for a long time  Surgery - TKA- both sides Other - heating bad helps    Interval History 11/02/22 She is here today for follow-up regarding her pain.  Reports her overall pain is doing better with duloxetine.  She initially had difficulty taking this medication is causing insomnia when she took it in the afternoon.  She started taking it in the morning and has been doing better.  She continues to have neck and lower back pain.  She also has joint pain in her knees and shoulders.  Her overall pain however is doing better since she started on this medicine.  She has been able to exercise more, doing stretches walking her dog and using an exercise bike.  She reports her mood is a little better as well.  Pain Inventory Average Pain 4 Pain Right Now 4 My pain is sharp and tingling  In the last 24 hours, has pain interfered with the following? General activity 2 Relation with others 2 Enjoyment of life 2 What TIME of day is your pain at its worst? morning  and night Sleep (in general) Fair  Pain is worse with: inactivity Pain improves with:  pacing activities Relief from Meds: 7  Family History  Problem Relation Age of Onset   Breast cancer Mother    Diabetes Mother    Hypertension Mother    Hyperlipidemia Mother    Heart disease Mother    Stroke Mother    Kidney disease Mother    Thyroid disease Mother    Obesity Mother    Hypertension Father    Social History   Socioeconomic History   Marital status: Married    Spouse name: Not on file   Number of children: 1    Years of education: Not on file   Highest education level: Not on file  Occupational History   Not on file  Tobacco Use   Smoking status: Never   Smokeless tobacco: Never  Vaping Use   Vaping Use: Never used  Substance and Sexual Activity   Alcohol use: Yes    Comment: occ   Drug use: No   Sexual activity: Not Currently    Birth control/protection: Post-menopausal  Other Topics Concern   Not on file  Social History Narrative   Not on file   Social Determinants of Health   Financial Resource Strain: Not on file  Food Insecurity: Not on file  Transportation Needs: Not on file  Physical Activity: Not on file  Stress: Not on file  Social Connections: Not on file   Past Surgical History:  Procedure Laterality Date   APPENDECTOMY     BIOPSY THYROID  2014   results benign   CHOLECYSTECTOMY  2008   DIAGNOSTIC LAPAROSCOPY     d/c endometriosis   DILATION AND CURETTAGE OF UTERUS     INSERTION OF MESH N/A 06/15/2016   Procedure: INSERTION OF MESH;  Surgeon: Chevis Pretty III, MD;  Location[facility];  Service: General;  Laterality: N/A;   KNEE ARTHROSCOPY Bilateral 2011   MASS EXCISION Right 04/21/2014   Procedure: EXCISION OF RIGHT BACK MASS;  Surgeon: Abigail Miyamoto, MD;  Location: [facility];  Service: General;  Laterality: Right;   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   TONSILLECTOMY     VENTRAL HERNIA REPAIR  06/15/2016   VENTRAL HERNIA REPAIR N/A 06/15/2016   Procedure: LAPAROSCOPIC VENTRAL HERNIA WITH MESH;  Surgeon: Chevis Pretty III, MD;  Location[facility];  Service: General;  Laterality: N/A;   Past Surgical History:  Procedure Laterality Date   APPENDECTOMY     BIOPSY THYROID  2014   results benign   CHOLECYSTECTOMY  2008   DIAGNOSTIC LAPAROSCOPY     d/c endometriosis   DILATION AND CURETTAGE OF UTERUS     INSERTION OF MESH N/A 06/15/2016   Procedure: INSERTION OF MESH;  Surgeon: Chevis Pretty III, MD;  Location[facility];  Service: General;  Laterality:  N/A;   KNEE ARTHROSCOPY Bilateral 2011   MASS EXCISION Right 04/21/2014   Procedure: EXCISION OF RIGHT BACK MASS;  Surgeon: Abigail Miyamoto, MD;  Location: [facility];  Service: General;  Laterality: Right;   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   TONSILLECTOMY     VENTRAL HERNIA REPAIR  06/15/2016   VENTRAL HERNIA REPAIR N/A 06/15/2016   Procedure: LAPAROSCOPIC VENTRAL HERNIA WITH MESH;  Surgeon: Chevis Pretty III, MD;  Location[facility];  Service: General;  Laterality: N/A;   Past Medical History:  Diagnosis Date   Allergy    takes Zyrtec daily   Anemia    Anxiety    Arthritis    psorriatric arthritis  Asthma    Albuterol as needed as well as neb as needed   Back pain    Bilateral swelling of feet    Chronic back pain    pinched nerve   Constipation    Cough    Depression    Family history of adverse reaction to anesthesia    pts dad gets sick with anesthesia   Fibromyalgia    takes Effexor daily   Fibromyalgia    Gallbladder problem    GERD (gastroesophageal reflux disease)    Headache    seasonal    History of bronchitis 2016   History of shingles    Hypertension    takes Amlodipine daily   Hypothyroid    Infertility, female    Joint pain    Joint pain    Joint swelling    Muscle spasm    takes Robaxin as needed   Osteoarthritis    Osteoarthritis    Palpitations    Pneumonia    hx of-10+ yrs ago   Prediabetes    Psoriasis    Psoriatic arthritis (HCC)    Shortness of breath    Vitamin D deficiency    Ht  (1.626 m)   Wt 214 lb (97.1 kg)   LMP 07/10/2018   BMI 36.73 kg/m   Opioid Risk Score:   Fall Risk Score:  `1  Depression screen [facility] 2/9     08/29/2022    9:20 AM 08/08/2022    9:58 AM 05/09/2022    8:33 AM 01/04/2022    3:40 PM 09/21/2021    3:21 PM 12/01/2019   12:10 PM  Depression screen PHQ 2/9  Decreased Interest 1 0 0 0 0 2  Down, Depressed, Hopeless 1 0 0 0 0 1  PHQ - 2 Score 2 0 0 0 0 3  Altered sleeping 1     1   Tired, decreased energy 1     3  Change in appetite 0     2  Feeling bad or failure about yourself  0     2  Trouble concentrating 0     2  Moving slowly or fidgety/restless 0     0  Suicidal thoughts 0     0  PHQ-9 Score 4     13  Difficult doing work/chores Not difficult at all     Not difficult at all      Review of Systems  Musculoskeletal:  Positive for back pain and neck pain.       B/L shoulders LT side  All other systems reviewed and are negative.      Objective:   Physical Exam   Gen: no distress, normal appearing HEENT: oral mucosa pink and moist, NCAT Cardio: Reg rate Chest: normal effort, normal rate of breathing Abd: soft, non-distended Ext: no edema Psych: pleasant, normal affect Skin: intact Neuro: Alert and oriented, follows commands, cranial nerves II through XII grossly intact, normal speech and language Strength 5 out of 5 in bilateral upper shoulder abduction, elbow flexion extension, finger flexion Strength 5 out of 5 in bilateral hip flexion, knee extension, ankle PF and DF Sensation tact light touch in all 4 extremities Musculoskeletal:  Slump Spurling's negative Tenderness throughout paraspinal muscles C-spine, T-spine, L-spine, periscapular muscles, QL, tender points but no trigger points noted today Mild tenderness to palpation bilateral greater trochanters,  bilateral shoulders    MRI L spine 12/23/21 IMPRESSION: Multilevel degenerative changes as detailed above. No high-grade  canal stenosis. Facet arthropathy with prominent joint effusions at L4-L5. There is increased mild to moderate foraminal narrowing at L4-L5. Similar moderate left foraminal narrowing at L5-S1 and potential displacement of the extraforaminal left L5 nerve root at this level.   MRI C spine 11/24/21 IMPRESSION: Multilevel degenerative changes as detailed above. No high-grade canal stenosis. Facet arthropathy with prominent joint effusions at L4-L5. There is increased  mild to moderate foraminal narrowing at L4-L5. Similar moderate left foraminal narrowing at L5-S1 and potential displacement of the extraforaminal left L5 nerve root at this level.     Assessment & Plan:   Assessment and Plan: Natasha Lyons is a 62 y.o. year old female  who  has a past medical history of Allergy, Anemia, Anxiety, Arthritis, Asthma, Back pain, Bilateral swelling of feet, Chronic back pain, Constipation, Cough, Depression, Family history of adverse reaction to anesthesia, Fibromyalgia, Fibromyalgia, Gallbladder problem, GERD (gastroesophageal reflux disease), Headache, History of bronchitis (2016), History of shingles, Hypertension, Hypothyroid, Infertility, female, Joint pain, Joint pain, Joint swelling, Muscle spasm, Osteoarthritis, Osteoarthritis, Palpitations, Pneumonia, Prediabetes, Psoriasis, Psoriatic arthritis (HCC), Shortness of breath, and Vitamin D deficiency.   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

## 2022-11-15 ENCOUNTER — Ambulatory Visit: Payer: BC Managed Care – PPO | Admitting: Emergency Medicine

## 2022-11-15 ENCOUNTER — Encounter: Payer: Self-pay | Admitting: Emergency Medicine

## 2022-11-15 VITALS — BP 130/70 | HR 76 | Temp 98.2°F | Ht 64.0 in | Wt 214.2 lb

## 2022-11-15 DIAGNOSIS — Z23 Encounter for immunization: Secondary | ICD-10-CM

## 2022-11-15 DIAGNOSIS — R7303 Prediabetes: Secondary | ICD-10-CM

## 2022-11-15 DIAGNOSIS — I10 Essential (primary) hypertension: Secondary | ICD-10-CM | POA: Diagnosis not present

## 2022-11-15 DIAGNOSIS — Z1211 Encounter for screening for malignant neoplasm of colon: Secondary | ICD-10-CM | POA: Diagnosis not present

## 2022-11-15 DIAGNOSIS — K219 Gastro-esophageal reflux disease without esophagitis: Secondary | ICD-10-CM

## 2022-11-15 DIAGNOSIS — J4520 Mild intermittent asthma, uncomplicated: Secondary | ICD-10-CM

## 2022-11-15 DIAGNOSIS — E039 Hypothyroidism, unspecified: Secondary | ICD-10-CM

## 2022-11-15 DIAGNOSIS — L4050 Arthropathic psoriasis, unspecified: Secondary | ICD-10-CM

## 2022-11-15 DIAGNOSIS — M159 Polyosteoarthritis, unspecified: Secondary | ICD-10-CM

## 2022-11-15 DIAGNOSIS — G894 Chronic pain syndrome: Secondary | ICD-10-CM

## 2022-11-15 NOTE — Assessment & Plan Note (Signed)
Stable.  Sees rheumatologist on a regular basis On weekly methotrexate 20 mg Rheumatology medications handled by their office.

## 2022-11-15 NOTE — Assessment & Plan Note (Signed)
Stable and asymptomatic at present time. ?

## 2022-11-15 NOTE — Assessment & Plan Note (Signed)
Diet and nutrition discussed Advised to avoid inflammatory foods Advised to decrease amount of daily carbohydrate intake and daily calories and increase amount of plant-based protein in her diet

## 2022-11-15 NOTE — Patient Instructions (Signed)

## 2022-11-15 NOTE — Progress Notes (Signed)
Nori Riis 62 y.o.   Chief Complaint  Patient presents with   Medical Management of Chronic Issues    f/u appt, no concerns     HISTORY OF PRESENT ILLNESS: This is a 62 y.o. female here for 44-month follow-up of chronic medical conditions Doing much better.  Was able to follow-up with pain management center. Office visit assessment and plan as follows: Assessment & Plan:    Assessment and Plan: Chaniyah Lakeish Bednarczyk is a 62 y.o. year old female  who  has a past medical history of Allergy, Anemia, Anxiety, Arthritis, Asthma, Back pain, Bilateral swelling of feet, Chronic back pain, Constipation, Cough, Depression, Family history of adverse reaction to anesthesia, Fibromyalgia, Fibromyalgia, Gallbladder problem, GERD (gastroesophageal reflux disease), Headache, History of bronchitis (2016), History of shingles, Hypertension, Hypothyroid, Infertility, female, Joint pain, Joint pain, Joint swelling, Muscle spasm, Osteoarthritis, Osteoarthritis, Palpitations, Pneumonia, Prediabetes, Psoriasis, Psoriatic arthritis (HCC), Shortness of breath, and Vitamin D deficiency.   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 Authorizing Provider  Acetaminophen (TYLENOL 8 HOUR PO) Take by mouth.   Yes [provider]  albuterol (PROVENTIL) (2.5 MG/3ML) 0.083% nebulizer solution Take 3 mLs (2.5 mg total) by nebulization every 6 (six) hours as needed for wheezing or shortness of breath. 02/01/22  Yes Tammatha Cobb, Eilleen Kempf, MD  albuterol (VENTOLIN HFA) 108 (90 Base) MCG/ACT inhaler Inhale 2 puffs into the lungs every 6 (six) hours as needed for wheezing.   Yes [provider]  amLODipine (NORVASC) 5 MG tablet TAKE 1 TABLET (5 MG TOTAL) BY MOUTH DAILY. 09/23/22  Yes Dessiree Sze, Eilleen Kempf, MD  ARMOUR THYROID 15 MG tablet Take 15 mg by mouth daily. 10/28/19  Yes [provider]  BD PEN NEEDLE NANO 2ND GEN 32G X 4 MM MISC USE ONCE A WEEK 04/26/20  Yes Helane Rima, DO  blood glucose meter kit and supplies KIT Dispense based on patient and insurance preference. Use up to four times daily as directed. 03/16/21  Yes Whitmire, Dawn W, FNP  buPROPion (WELLBUTRIN XL) 150 MG 24 hr tablet Take 1 tablet (150 mg total) by mouth daily. 08/08/22  Yes Kyleigha Markert, Eilleen Kempf, MD  CELEBREX 200 MG capsule Take 200 mg by mouth 2 (two) times daily. 08/03/22  Yes [provider]  cetirizine (ZYRTEC) 10 MG tablet Take 10 mg by mouth daily.   Yes [provider]  clobetasol ointment (TEMOVATE) 0.05 % Apply 1 application topically 2 (two) times daily. 08/11/19  Yes  [provider]  DULoxetine (CYMBALTA) 30 MG capsule Take 1 capsule (30 mg total) by mouth daily. 09/08/22 09/08/23 Yes Fanny Dance, MD  Estradiol 10 MCG TABS vaginal tablet Place 1 tablet (10 mcg total) vaginally 2 (two) times a week. 03/09/22  Yes Romualdo Bolk, MD  fluticasone Aleda Grana) 50 MCG/ACT  nasal spray  05/12/18  Yes [provider]  fluticasone-salmeterol (WIXELA INHUB) 250-50 MCG/ACT AEPB Inhale 1 puff into the lungs in the morning and at bedtime. 01/04/22  Yes Babatunde Seago, Eilleen Kempf, MD  folic acid (FOLVITE) 800 MCG tablet Take 800 mcg by mouth daily.    Yes [provider]  methotrexate (RHEUMATREX) 2.5 MG tablet Take 20 mg by mouth every Sunday. Caution:Chemotherapy. Protect from light.   Yes [provider]  montelukast (SINGULAIR) 10 MG tablet TAKE 1 TABLET BY MOUTH EVERYDAY AT BEDTIME 07/04/22  Yes Destini Cambre, Eilleen Kempf, MD  phentermine (ADIPEX-P) 37.5 MG tablet 1 tablet before breakfast by mouth Once a day for 30 days 08/10/22  Yes [provider]  Secukinumab 150 MG/ML SOSY Inject 300 mg into the skin every 30 (thirty) days.   Yes [provider]  tiZANidine (ZANAFLEX) 2 MG tablet 1 tablet as needed Orally Three times a day   Yes [provider]  topiramate (TOPAMAX) 25 MG tablet TAKE 1 TABLET BY MOUTH EVERY DAY for 90   Yes [provider]  Vitamin D, Ergocalciferol, (DRISDOL) 1.25 MG (50000 UNIT) CAPS capsule Take 1 capsule (50,000 Units total) by mouth every 7 (seven) days. 09/08/21  Yes Alois Cliche, PA-C  WIXELA INHUB 250-50 MCG/DOSE AEPB Inhale 1 puff into the lungs 2 (two) times daily. 10/10/19  Yes [provider]  COLLAGEN PO Take 1 tablet by mouth daily at 6 (six) AM. Patient not taking: Reported on 08/29/2022    [provider]  naproxen (NAPROSYN) 500 MG tablet Take 1 tablet (500 mg total) by mouth 2 (two) times daily. Patient not taking: Reported on 08/29/2022 08/08/17   Glynn Octave, MD  OMEPRAZOLE PO Take by mouth. Patient not taking: Reported on 08/29/2022    [provider]    Allergies  Allergen Reactions   Dust Mite Extract Shortness Of Breath   Grass Extracts [Gramineae Pollens] Shortness Of Breath   Tree Extract Shortness Of Breath    Certain tree allergies     Patient Active Problem List   Diagnosis Date Noted   Seasonal allergies 01/04/2022   Primary osteoarthritis involving multiple joints 01/04/2022   Chronic idiopathic constipation 09/21/2021   Gastro-esophageal reflux disease without esophagitis 09/21/2021   Cervical spondylosis without myelopathy 03/07/2021   Joint pain 03/07/2021   Osteoarthritis 03/07/2021   Fibromyalgia 03/07/2021   H/O total knee replacement, 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   Depression 04/05/2020   Other specified anxiety disorders 04/05/2020   Chronic pain of right knee 04/05/2020   Prediabetes 02/09/2020   History of endometriosis 02/06/2020   Osteoporosis 01/06/2020   Nonallopathic lesion of cervical region 04/24/2016   Nonallopathic lesion of rib cage 04/24/2016   SI (sacroiliac) joint dysfunction 03/21/2016   Nonallopathic lesion of sacral region 03/21/2016   Nonallopathic lesion of lumbosacral region 03/21/2016   Nonallopathic lesion of thoracic region 03/21/2016   Rheumatoid bursitis, 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/2015   Psoriatic arthritis (HCC) 04/07/2015   Chronic pain syndrome 04/07/2015   Psoriasis 08/10/2013   Vitamin D deficiency 08/10/2013   Thyroid nodule 05/02/2013   Essential hypertension 03/06/2013   Acquired hypothyroidism 03/06/2013   Extrinsic asthma,  mild, intermittent 03/06/2013   Class 2 severe obesity with serious comorbidity and body mass index (BMI) of 36.0 to 36.9 in adult [facility]) 03/06/2013    Past Medical History:  Diagnosis Date   Allergy    takes Zyrtec daily   Anemia    Anxiety    Arthritis    psorriatric arthritis   Asthma    Albuterol as needed as well as neb as needed   Back pain    Bilateral swelling of feet    Chronic back pain    pinched nerve   Constipation    Cough    Depression    Family history of adverse reaction to anesthesia    pts dad gets sick with anesthesia   Fibromyalgia    takes Effexor daily   Fibromyalgia    Gallbladder problem    GERD  (gastroesophageal reflux disease)    Headache    seasonal    History of bronchitis 2016   History of shingles    Hypertension    takes Amlodipine daily   Hypothyroid    Infertility, female    Joint pain    Joint pain    Joint swelling    Muscle spasm    takes Robaxin as needed   Osteoarthritis    Osteoarthritis    Palpitations    Pneumonia    hx of-10+ yrs ago   Prediabetes    Psoriasis    Psoriatic arthritis (HCC)    Shortness of breath    Vitamin D deficiency     Past Surgical History:  Procedure Laterality Date   APPENDECTOMY     BIOPSY THYROID  2014   results benign   CHOLECYSTECTOMY  2008   DIAGNOSTIC LAPAROSCOPY     d/c endometriosis   DILATION AND CURETTAGE OF UTERUS     INSERTION OF MESH N/A 06/15/2016   Procedure: INSERTION OF MESH;  Surgeon: Chevis Pretty III, MD;  Location: [facility] OR;  Service: General;  Laterality: N/A;   KNEE ARTHROSCOPY Bilateral 2011   MASS EXCISION Right 04/21/2014   Procedure: EXCISION OF RIGHT BACK MASS;  Surgeon: Abigail Miyamoto, MD;  Location: [facility];  Service: General;  Laterality: Right;   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   TONSILLECTOMY     VENTRAL HERNIA REPAIR  06/15/2016   VENTRAL HERNIA REPAIR N/A 06/15/2016   Procedure: LAPAROSCOPIC VENTRAL HERNIA WITH MESH;  Surgeon: Chevis Pretty III, MD;  Location: [facility] OR;  Service: General;  Laterality: N/A;    Social History   Socioeconomic History   Marital status: Married    Spouse name: Not on file   Number of children: 1   Years of education: Not on file   Highest education level: Not on file  Occupational History   Not on file  Tobacco Use   Smoking status: Never   Smokeless tobacco: Never  Vaping Use   Vaping Use: Never used  Substance and Sexual Activity   Alcohol use: Yes    Comment: occ   Drug use: No   Sexual activity: Not Currently    Birth control/protection: Post-menopausal  Other Topics Concern   Not on file  Social History Narrative    Not on file   Social Determinants of Health   Financial Resource Strain: Not on file  Food Insecurity: Not on file  Transportation Needs: Not on file  Physical Activity: Not on file  Stress: Not on file  Social Connections: Not on file  Intimate Partner  Violence: Not on file    Family History  Problem Relation Age of Onset   Breast cancer Mother    Diabetes Mother    Hypertension Mother    Hyperlipidemia Mother    Heart disease Mother    Stroke Mother    Kidney disease Mother    Thyroid disease Mother    Obesity Mother    Hypertension Father      Review of Systems  Constitutional: Negative.  Negative for chills and fever.  HENT: Negative.  Negative for congestion and sore throat.   Respiratory: Negative.  Negative for cough and shortness of breath.   Cardiovascular: Negative.  Negative for chest pain and palpitations.  Genitourinary: Negative.  Negative for dysuria and hematuria.  Musculoskeletal:  Positive for joint pain.  Skin: Negative.  Negative for rash.  Neurological: Negative.  Negative for dizziness and headaches.  All other systems reviewed and are negative.   Vitals:   11/15/22 1538  BP: (!) 140/82  Pulse: 76  Temp: 98.2 F (36.8 C)  SpO2: 96%    Physical Exam Vitals reviewed.  Constitutional:      Appearance: Normal appearance.  HENT:     Head: Normocephalic.     Mouth/Throat:     Mouth: Mucous membranes are moist.     Pharynx: Oropharynx is clear.  Eyes:     Extraocular Movements: Extraocular movements intact.     Conjunctiva/sclera: Conjunctivae normal.     Pupils: Pupils are equal, round, and reactive to light.  Cardiovascular:     Rate and Rhythm: Normal rate and regular rhythm.     Pulses: Normal pulses.     Heart sounds: Normal heart sounds.  Pulmonary:     Effort: Pulmonary effort is normal.     Breath sounds: Normal breath sounds.  Musculoskeletal:     Cervical back: No tenderness.  Lymphadenopathy:     Cervical: No cervical  adenopathy.  Skin:    General: Skin is warm and dry.     Capillary Refill: Capillary refill takes less than 2 seconds.  Neurological:     General: No focal deficit present.     Mental Status: She is alert and oriented to person, place, and time.  Psychiatric:        Mood and Affect: Mood normal.        Behavior: Behavior normal.      ASSESSMENT & PLAN: A total of 47 minutes was spent with the patient and counseling/coordination of care regarding preparing for this visit, review of most recent office visit notes, review of most recent pain management clinic office visit notes, review of multiple chronic medical conditions and their management, review of all medications, education on nutrition, review of health maintenance items, prognosis, documentation and need for follow-up  Problem List Items Addressed This Visit       Cardiovascular and Mediastinum   Essential hypertension    Elevated blood pressure in the office but normal blood pressure readings at home Cardiovascular risks associated with hypertension discussed Continue amlodipine 5 mg daily Benefits of exercise discussed Advised to monitor blood pressure readings at home daily for the next several weeks and keep a log.  Advised to contact the office if numbers persistently abnormal.        Respiratory   Extrinsic asthma, mild, intermittent    Well-controlled.  No recent exacerbations Continues Wixela inhaler 1 puff twice a day        Digestive   Gastro-esophageal reflux disease without esophagitis  Stable and asymptomatic at present time        Endocrine   Acquired hypothyroidism    Clinically euthyroid. Continues Armour Thyroid 15 mg daily        Musculoskeletal and Integument   Psoriatic arthritis (HCC)    Stable.  Sees rheumatologist on a regular basis On weekly methotrexate 20 mg Rheumatology medications handled by their office.      Primary osteoarthritis involving multiple joints    Stable.   Follows up with rheumatologist on a regular basis        Other   Chronic pain syndrome    Was able to follow-up with pain management clinic Feeling much better. Exercising more and taking medications as prescribed On Cymbalta 30 mg daily      Prediabetes    Diet and nutrition discussed Advised to avoid inflammatory foods Advised to decrease amount of daily carbohydrate intake and daily calories and increase amount of plant-based protein in her diet      Other Visit Diagnoses     Need for vaccination    -  Primary   Relevant Orders   Zoster Recombinant (Shingrix ) (Completed)   Screening for colon cancer       Relevant Orders   Ambulatory referral to Gastroenterology      Patient Instructions  Health Maintenance, Female Adopting a healthy lifestyle and getting preventive care are important in promoting health and wellness. Ask your health care provider about: The right schedule for you to have regular tests and exams. Things you can do on your own to prevent diseases and keep yourself healthy. What should I know about diet, weight, and exercise? Eat a healthy diet  Eat a diet that includes plenty of vegetables, fruits, low-fat dairy products, and lean protein. Do not eat a lot of foods that are high in solid fats, added sugars, or sodium. Maintain a healthy weight Body mass index (BMI) is used to identify weight problems. It estimates body fat based on height and weight. Your health care provider can help determine your BMI and help you achieve or maintain a healthy weight. Get regular exercise Get regular exercise. This is one of the most important things you can do for your health. Most adults should: Exercise for at least 150 minutes each week. The exercise should increase your heart rate and make you sweat (moderate-intensity exercise). Do strengthening exercises at least twice a week. This is in addition to the moderate-intensity exercise. Spend less time sitting.  Even light physical activity can be beneficial. Watch cholesterol and blood lipids Have your blood tested for lipids and cholesterol at 60 years of age, then have this test every 5 years. Have your cholesterol levels checked more often if: Your lipid or cholesterol levels are high. You are older than 60 years of age. You are at high risk for heart disease. What should I know about cancer screening? Depending on your health history and family history, you may need to have cancer screening at various ages. This may include screening for: Breast cancer. Cervical cancer. Colorectal cancer. Skin cancer. Lung cancer. What should I know about heart disease, diabetes, and high blood pressure? Blood pressure and heart disease High blood pressure causes heart disease and increases the risk of stroke. This is more likely to develop in people who have high blood pressure readings or are overweight. Have your blood pressure checked: Every 3-5 years if you are 31-54 years of age. Every year if you are 26 years old  or older. Diabetes Have regular diabetes screenings. This checks your fasting blood sugar level. Have the screening done: Once every three years after age 50 if you are at a normal weight and have a low risk for diabetes. More often and at a younger age if you are overweight or have a high risk for diabetes. What should I know about preventing infection? Hepatitis B If you have a higher risk for hepatitis B, you should be screened for this virus. Talk with your health care provider to find out if you are at risk for hepatitis B infection. Hepatitis C Testing is recommended for: Everyone born from [birth year] through 1963. Anyone with known risk factors for hepatitis C. Sexually transmitted infections (STIs) Get screened for STIs, including gonorrhea and chlamydia, if: You are sexually active and are younger than 60 years of age. You are older than 60 years of age and your health care provider  tells you that you are at risk for this type of infection. Your sexual activity has changed since you were last screened, and you are at increased risk for chlamydia or gonorrhea. Ask your health care provider if you are at risk. Ask your health care provider about whether you are at high risk for HIV. Your health care provider may recommend a prescription medicine to help prevent HIV infection. If you choose to take medicine to prevent HIV, you should first get tested for HIV. You should then be tested every 3 months for as long as you are taking the medicine. Pregnancy If you are about to stop having your period (premenopausal) and you may become pregnant, seek counseling before you get pregnant. Take 400 to 800 micrograms (mcg) of folic acid every day if you become pregnant. Ask for birth control (contraception) if you want to prevent pregnancy. Osteoporosis and menopause Osteoporosis is a disease in which the bones lose minerals and strength with aging. This can result in bone fractures. If you are 78 years old or older, or if you are at risk for osteoporosis and fractures, ask your health care provider if you should: Be screened for bone loss. Take a calcium or vitamin D supplement to lower your risk of fractures. Be given hormone replacement therapy (HRT) to treat symptoms of menopause. Follow these instructions at home: Alcohol use Do not drink alcohol if: Your health care provider tells you not to drink. You are pregnant, may be pregnant, or are planning to become pregnant. If you drink alcohol: Limit how much you have to: 0-1 drink a day. Know how much alcohol is in your drink. In the [place] one drink equals one 12 oz bottle of beer (355 mL), one 5 oz glass of wine (148 mL), or one 1 oz glass of hard liquor (44 mL). Lifestyle Do not use any products that contain nicotine or tobacco. These products include cigarettes, chewing tobacco, and vaping devices, such as e-cigarettes. If you  need help quitting, ask your health care provider. Do not use street drugs. Do not share needles. Ask your health care provider for help if you need support or information about quitting drugs. General instructions Schedule regular health, dental, and eye exams. Stay current with your vaccines. Tell your health care provider if: You often feel depressed. You have ever been abused or do not feel safe at home. Summary Adopting a healthy lifestyle and getting preventive care are important in promoting health and wellness. Follow your health care provider's instructions about healthy diet, exercising, and getting tested or  screened for diseases. Follow your health care provider's instructions on monitoring your cholesterol and blood pressure. This information is not intended to replace advice given to you by your health care provider. Make sure you discuss any questions you have with your health care provider. Document Revised: 11/15/2020 Document Reviewed: 11/15/2020 Elsevier Patient Education  2023 Elsevier Inc.     Edwina Barth, MD [facility]

## 2022-11-15 NOTE — Assessment & Plan Note (Signed)
Was able to follow-up with pain management clinic Feeling much better. Exercising more and taking medications as prescribed On Cymbalta 30 mg daily

## 2022-11-15 NOTE — Assessment & Plan Note (Signed)
Clinically euthyroid. Continues Armour Thyroid 15 mg daily

## 2022-11-15 NOTE — Assessment & Plan Note (Signed)
Elevated blood pressure in the office but normal blood pressure readings at home Cardiovascular risks associated with hypertension discussed Continue amlodipine 5 mg daily Benefits of exercise discussed Advised to monitor blood pressure readings at home daily for the next several weeks and keep a log.  Advised to contact the office if numbers persistently abnormal.

## 2022-11-15 NOTE — Assessment & Plan Note (Signed)
Well-controlled.  No recent exacerbations Continues Wixela inhaler 1 puff twice a day

## 2022-11-15 NOTE — Assessment & Plan Note (Signed)
Stable.  Follows up with rheumatologist on a regular basis

## 2023-01-05 ENCOUNTER — Other Ambulatory Visit: Payer: Self-pay | Admitting: Emergency Medicine

## 2023-02-02 ENCOUNTER — Encounter
Payer: BC Managed Care – PPO | Attending: Physical Medicine & Rehabilitation | Admitting: Physical Medicine & Rehabilitation

## 2023-02-02 ENCOUNTER — Encounter: Payer: Self-pay | Admitting: Physical Medicine & Rehabilitation

## 2023-02-02 VITALS — BP 126/74 | HR 66 | Ht 64.0 in | Wt 195.6 lb

## 2023-02-02 DIAGNOSIS — F39 Unspecified mood [affective] disorder: Secondary | ICD-10-CM | POA: Diagnosis present

## 2023-02-02 DIAGNOSIS — M797 Fibromyalgia: Secondary | ICD-10-CM | POA: Diagnosis present

## 2023-02-02 DIAGNOSIS — L4050 Arthropathic psoriasis, unspecified: Secondary | ICD-10-CM

## 2023-02-02 DIAGNOSIS — M545 Low back pain, unspecified: Secondary | ICD-10-CM | POA: Diagnosis present

## 2023-02-02 DIAGNOSIS — M47812 Spondylosis without myelopathy or radiculopathy, cervical region: Secondary | ICD-10-CM

## 2023-02-02 DIAGNOSIS — G8929 Other chronic pain: Secondary | ICD-10-CM

## 2023-02-02 MED ORDER — DULOXETINE HCL 60 MG PO CPEP
60.0000 mg | ORAL_CAPSULE | Freq: Every day | ORAL | 2 refills | Status: DC
Start: 2023-02-02 — End: 2023-05-01

## 2023-02-02 NOTE — Progress Notes (Signed)
Subjective:    Patient ID: Natasha Lyons, female    DOB: [birth year], 62 y.o.   MRN: [id number]  HPI    Natasha Lyons is a 62 y.o. year old female  who  has a past medical history of Allergy, Anemia, Anxiety, Arthritis, Asthma, Back pain, Bilateral swelling of feet, Chronic back pain, Constipation, Cough, Depression, Family history of adverse reaction to anesthesia, Fibromyalgia, Fibromyalgia, Gallbladder problem, GERD (gastroesophageal reflux disease), Headache, History of bronchitis (2016), History of shingles, Hypertension, Hypothyroid, Infertility, female, Joint pain, Joint pain, Joint swelling, Muscle spasm, Osteoarthritis, Osteoarthritis, Palpitations, Pneumonia, Prediabetes, Psoriasis, Psoriatic arthritis (HCC), Shortness of breath, and Vitamin D deficiency.   They are presenting to PM&R clinic as a new patient for pain management evaluation.  Natasha Lyons reports a history of psoriatic arthritis.  She has a lot of pain in her shoulders and back..  She has pain that radiates down her legs pain began about 20 years ago but has been worsening over time.  Her rheumatologist has also diagnosed with fibromyalgia.  She has had a prior TKR on the left and also on the right knee.  Pain is worsened by cold.  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  Her activity is decreased since her knee surgeries.  She reports some weight gain due to decreased activity.  Mood is often decreased.  She sleeps poorly most nights.  Has issues with occasional constipation.     No red flags for back pain endorsed in Hx or ROS, saddle anesthesia, loss of bowel or bladder continence, new weakness, new numbness/tingling, and pain waking up at nighttime.   Medications tried: Topical medications voltaren gel Nsaids Naproxen- did not help, celebrex- minimal help, Voltaren gel helps Tylenol -takes all the time, helps a little Gabapentin- did not help Lyrica-appears she  had a low-dose of Lyrica in the past TCAs  -amitriptyline made her sleepy SNRIs  -Has not tried  Other  Tizanidine - does help a little, helps her sleep   Other treatments: PT/OT - after surgery, helped Accupuncture/chiropractor/massage - chiro treatment helped lower back, didn't feel comfortable on neck TENs unit - Used it a long time ago Injections -  Cortisone injection in back- helped for a long time  Surgery - TKA- both sides Other - heating bad helps     Interval History 11/02/22 She is here today for follow-up regarding her pain.  Reports her overall pain is doing better with duloxetine.  She initially had difficulty taking this medication is causing insomnia when she took it in the afternoon.  She started taking it in the morning and has been doing better.  She continues to have neck and lower back pain.  She also has joint pain in her knees and shoulders.  Her overall pain however is doing better since she started on this medicine.  She has been able to exercise more, doing stretches walking her dog and using an exercise bike.  She reports her mood is a little better as well.    Interval history 02/02/2023 Patient is here for follow-up regarding chronic pain and fibromyalgia.  Patient reports she continues to have soreness throughout multiple areas of her body.  She continues to have pain in her lower back and neck.  She does feel duloxetine is helping reduce her overall pain.  She is not having any side effects with duloxetine.  While she continues to have pain,  she reports she is more active and doing a lot more than she used to previously.  TENS unit is also helping her overall pain.  She feels that her mood is doing okay overall.  She says she has lost weight.  Pain Inventory Average Pain 8 Pain Right Now 8 My pain is sharp, dull, aching, and depends on time of day  In the last 24 hours, has pain interfered with the following? General activity 3 Relation with others  3 Enjoyment of life 3 What TIME of day is your pain at its worst? night Sleep (in general) Fair  Pain is worse with:  other Pain improves with: heat/ice, therapy/exercise, medication, and TENS Relief from Meds: 8  Family History  Problem Relation Age of Onset   Breast cancer Mother    Diabetes Mother    Hypertension Mother    Hyperlipidemia Mother    Heart disease Mother    Stroke Mother    Kidney disease Mother    Thyroid disease Mother    Obesity Mother    Hypertension Father    Social History   Socioeconomic History   Marital status: Married    Spouse name: Not on file   Number of children: 1   Years of education: Not on file   Highest education level: Not on file  Occupational History   Not on file  Tobacco Use   Smoking status: Never   Smokeless tobacco: Never  Vaping Use   Vaping status: Never Used  Substance and Sexual Activity   Alcohol use: Yes    Comment: occ   Drug use: No   Sexual activity: Not Currently    Birth control/protection: Post-menopausal  Other Topics Concern   Not on file  Social History Narrative   Not on file   Social Determinants of Health   Financial Resource Strain: Not on file  Food Insecurity: Not on file  Transportation Needs: Not on file  Physical Activity: Not on file  Stress: Not on file  Social Connections: Not on file   Past Surgical History:  Procedure Laterality Date   APPENDECTOMY     BIOPSY THYROID  2014   results benign   CHOLECYSTECTOMY  2008   DIAGNOSTIC LAPAROSCOPY     d/c endometriosis   DILATION AND CURETTAGE OF UTERUS     INSERTION OF MESH N/A 06/15/2016   Procedure: INSERTION OF MESH;  Surgeon: Chevis Pretty III, MD;  Location: [facility];  Service: General;  Laterality: N/A;   KNEE ARTHROSCOPY Bilateral 2011   MASS EXCISION Right 04/21/2014   Procedure: EXCISION OF RIGHT BACK MASS;  Surgeon: Abigail Miyamoto, MD;  Location: [facility];  Service: General;  Laterality: Right;   REPLACEMENT  TOTAL KNEE Right 01/05/2021   TONSILLECTOMY     VENTRAL HERNIA REPAIR  06/15/2016   VENTRAL HERNIA REPAIR N/A 06/15/2016   Procedure: LAPAROSCOPIC VENTRAL HERNIA WITH MESH;  Surgeon: Chevis Pretty III, MD;  Location: [facility];  Service: General;  Laterality: N/A;   Past Surgical History:  Procedure Laterality Date   APPENDECTOMY     BIOPSY THYROID  2014   results benign   CHOLECYSTECTOMY  2008   DIAGNOSTIC LAPAROSCOPY     d/c endometriosis   DILATION AND CURETTAGE OF UTERUS     INSERTION OF MESH N/A 06/15/2016   Procedure: INSERTION OF MESH;  Surgeon: Chevis Pretty III, MD;  Location: [facility];  Service: General;  Laterality: N/A;   KNEE ARTHROSCOPY Bilateral 2011  MASS EXCISION Right 04/21/2014   Procedure: EXCISION OF RIGHT BACK MASS;  Surgeon: Abigail Miyamoto, MD;  Location: [facility];  Service: General;  Laterality: Right;   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   TONSILLECTOMY     VENTRAL HERNIA REPAIR  06/15/2016   VENTRAL HERNIA REPAIR N/A 06/15/2016   Procedure: LAPAROSCOPIC VENTRAL HERNIA WITH MESH;  Surgeon: Chevis Pretty III, MD;  Location: [facility];  Service: General;  Laterality: N/A;   Past Medical History:  Diagnosis Date   Allergy    takes Zyrtec daily   Anemia    Anxiety    Arthritis    psorriatric arthritis   Asthma    Albuterol as needed as well as neb as needed   Back pain    Bilateral swelling of feet    Chronic back pain    pinched nerve   Constipation    Cough    Depression    Family history of adverse reaction to anesthesia    pts dad gets sick with anesthesia   Fibromyalgia    takes Effexor daily   Fibromyalgia    Gallbladder problem    GERD (gastroesophageal reflux disease)    Headache    seasonal    History of bronchitis 2016   History of shingles    Hypertension    takes Amlodipine daily   Hypothyroid    Infertility, female    Joint pain    Joint pain    Joint swelling    Muscle spasm    takes Robaxin as needed   Osteoarthritis     Osteoarthritis    Palpitations    Pneumonia    hx of-10+ yrs ago   Prediabetes    Psoriasis    Psoriatic arthritis (HCC)    Shortness of breath    Vitamin D deficiency    BP 126/74   Pulse 66   Ht 5\' 4"  (1.626 m)   Wt 195 lb 9.6 oz (88.7 kg)   LMP 07/10/2018   SpO2 93%   BMI 33.57 kg/m   Opioid Risk Score:   Fall Risk Score:  `1  Depression screen [facility] 2/9     02/02/2023   10:07 AM 11/15/2022    3:39 PM 11/02/2022    9:44 AM 08/29/2022    9:20 AM 08/08/2022    9:58 AM 05/09/2022    8:33 AM 01/04/2022    3:40 PM  Depression screen PHQ 2/9  Decreased Interest 0 0 0 1 0 0 0  Down, Depressed, Hopeless 0 0 0 1 0 0 0  PHQ - 2 Score 0 0 0 2 0 0 0  Altered sleeping    1     Tired, decreased energy    1     Change in appetite    0     Feeling bad or failure about yourself     0     Trouble concentrating    0     Moving slowly or fidgety/restless    0     Suicidal thoughts    0     PHQ-9 Score    4     Difficult doing work/chores    Not difficult at all       Review of Systems  Constitutional: Negative.   HENT: Negative.    Eyes: Negative.   Respiratory: Negative.    Cardiovascular: Negative.   Gastrointestinal: Negative.   Endocrine: Negative.   Genitourinary: Negative.   Musculoskeletal:  Positive for back pain and neck pain.       Bilateral shoulder pain   Skin: Negative.   Allergic/Immunologic: Negative.   Neurological: Negative.   Hematological: Negative.   Psychiatric/Behavioral: Negative.    All other systems reviewed and are negative.      Objective:   Physical Exam   Gen: no distress, normal appearing HEENT: oral mucosa pink and moist, NCAT Chest: normal effort, normal rate of breathing Abd: soft, non-distended Ext: no edema Psych: pleasant, normal affect Skin: intact Neuro: Alert and oriented, follows commands, cranial nerves II through XII grossly intact, normal speech and language No focal motor deficits noted Sensation tact light touch  in all 4 extremities Musculoskeletal:  Slump test negative Spurling's negative Tenderness throughout paraspinal muscles C-spine, T-spine, L-spine Periscapular muscles tender but no significant trigger points noted today Mild tenderness to palpation bilateral greater trochanters left greater than right,  bilateral shoulders Tenderness around bilateral knee joints   MRI L spine 12/23/21 IMPRESSION: Multilevel degenerative changes as detailed above. No high-grade canal stenosis. Facet arthropathy with prominent joint effusions at L4-L5. There is increased mild to moderate foraminal narrowing at L4-L5. Similar moderate left foraminal narrowing at L5-S1 and potential displacement of the extraforaminal left L5 nerve root at this level.   MRI C spine 11/24/21 IMPRESSION: Multilevel degenerative changes as detailed above. No high-grade canal stenosis. Facet arthropathy with prominent joint effusions at L4-L5. There is increased mild to moderate foraminal narrowing at L4-L5. Similar moderate left foraminal narrowing at L5-S1 and potential displacement of the extraforaminal left L5 nerve root at this level.         Assessment & Plan:   Assessment and Plan: Natasha Lyons is a 62 y.o. year old female  who  has a past medical history of Allergy, Anemia, Anxiety, Arthritis, Asthma, Back pain, Bilateral swelling of feet, Chronic back pain, Constipation, Cough, Depression, Family history of adverse reaction to anesthesia, Fibromyalgia, Fibromyalgia, Gallbladder problem, GERD (gastroesophageal reflux disease), Headache, History of bronchitis (2016), History of shingles, Hypertension, Hypothyroid, Infertility, female, Joint pain, Joint pain, Joint swelling, Muscle spasm, Osteoarthritis, Osteoarthritis, Palpitations, Pneumonia, Prediabetes, Psoriasis, Psoriatic arthritis (HCC), Shortness of breath, and Vitamin D deficiency.   They are presenting to PM&R clinic as a new patient for treatment of  chronic pain syndrome   Fibromyalgia Continue gradual progressive low impact exercises Consider trying yoga or thai chi Continue Nexwave- Helping pain per patient report  Continue tizanidine PRN We previously discussed Lyrica, pt did not try due to concern of wt gain Increase duloxetine to 60 mg daily Discussed Theracane Discussed food options that can help pain Consider shockwave therapy at later visit   Chronic back and neck pain with lumbar and cervical spondylosis Consider consult physical therapy at later time if she has time to do this Previously had improvements with ESI   Psoriatic arthritis -Continue follow-up with rheumatology, she is currently on methotrexate   Mood disorder -She is currently on bupropion -Continue duloxetine, dose increased as above

## 2023-03-14 ENCOUNTER — Ambulatory Visit: Payer: BC Managed Care – PPO | Admitting: Obstetrics and Gynecology

## 2023-03-30 ENCOUNTER — Other Ambulatory Visit: Payer: Self-pay | Admitting: Emergency Medicine

## 2023-03-30 MED ORDER — FLUTICASONE PROPIONATE 50 MCG/ACT NA SUSP
1.0000 | Freq: Every day | NASAL | 1 refills | Status: DC
Start: 2023-03-30 — End: 2023-07-31

## 2023-04-18 ENCOUNTER — Encounter: Payer: Self-pay | Admitting: Radiology

## 2023-04-18 ENCOUNTER — Ambulatory Visit (INDEPENDENT_AMBULATORY_CARE_PROVIDER_SITE_OTHER): Payer: BC Managed Care – PPO | Admitting: Radiology

## 2023-04-18 VITALS — BP 134/86 | Ht 63.5 in | Wt 187.0 lb

## 2023-04-18 DIAGNOSIS — N952 Postmenopausal atrophic vaginitis: Secondary | ICD-10-CM | POA: Diagnosis not present

## 2023-04-18 DIAGNOSIS — Z01419 Encounter for gynecological examination (general) (routine) without abnormal findings: Secondary | ICD-10-CM | POA: Diagnosis not present

## 2023-04-18 MED ORDER — ESTRADIOL 10 MCG VA TABS: 1.0000 | ORAL_TABLET | VAGINAL | 3 refills | Status: DC

## 2023-04-18 NOTE — Patient Instructions (Signed)
Preventive Care 40-60 Years Old, Female Preventive care refers to lifestyle choices and visits with your health care provider that can promote health and wellness. Preventive care visits are also called wellness exams. What can I expect for my preventive care visit? Counseling Your health care provider may ask you questions about your: Medical history, including: Past medical problems. Family medical history. Pregnancy history. Current health, including: Menstrual cycle. Method of birth control. Emotional well-being. Home life and relationship well-being. Sexual activity and sexual health. Lifestyle, including: Alcohol, nicotine or tobacco, and drug use. Access to firearms. Diet, exercise, and sleep habits. Work and work environment. Sunscreen use. Safety issues such as seatbelt and bike helmet use. Physical exam Your health care provider will check your: Height and weight. These may be used to calculate your BMI (body mass index). BMI is a measurement that tells if you are at a healthy weight. Waist circumference. This measures the distance around your waistline. This measurement also tells if you are at a healthy weight and may help predict your risk of certain diseases, such as type 2 diabetes and high blood pressure. Heart rate and blood pressure. Body temperature. Skin for abnormal spots. What immunizations do I need?  Vaccines are usually given at various ages, according to a schedule. Your health care provider will recommend vaccines for you based on your age, medical history, and lifestyle or other factors, such as travel or where you work. What tests do I need? Screening Your health care provider may recommend screening tests for certain conditions. This may include: Lipid and cholesterol levels. Diabetes screening. This is done by checking your blood sugar (glucose) after you have not eaten for a while (fasting). Pelvic exam and Pap test. Hepatitis B test. Hepatitis C  test. HIV (human immunodeficiency virus) test. STI (sexually transmitted infection) testing, if you are at risk. Lung cancer screening. Colorectal cancer screening. Mammogram. Talk with your health care provider about when you should start having regular mammograms. This may depend on whether you have a family history of breast cancer. BRCA-related cancer screening. This may be done if you have a family history of breast, ovarian, tubal, or peritoneal cancers. Bone density scan. This is done to screen for osteoporosis. Talk with your health care provider about your test results, treatment options, and if necessary, the need for more tests. Follow these instructions at home: Eating and drinking  Eat a diet that includes fresh fruits and vegetables, whole grains, lean protein, and low-fat dairy products. Take vitamin and mineral supplements as recommended by your health care provider. Do not drink alcohol if: Your health care provider tells you not to drink. You are pregnant, may be pregnant, or are planning to become pregnant. If you drink alcohol: Limit how much you have to 0-1 drink a day. Know how much alcohol is in your drink. In the [place] one drink equals one 12 oz bottle of beer (355 mL), one 5 oz glass of wine (148 mL), or one 1 oz glass of hard liquor (44 mL). Lifestyle Brush your teeth every morning and night with fluoride toothpaste. Floss one time each day. Exercise for at least 30 minutes 5 or more days each week. Do not use any products that contain nicotine or tobacco. These products include cigarettes, chewing tobacco, and vaping devices, such as e-cigarettes. If you need help quitting, ask your health care provider. Do not use drugs. If you are sexually active, practice safe sex. Use a condom or other form of protection to   prevent STIs. If you do not wish to become pregnant, use a form of birth control. If you plan to become pregnant, see your health care provider for a  prepregnancy visit. Take aspirin only as told by your health care provider. Make sure that you understand how much to take and what form to take. Work with your health care provider to find out whether it is safe and beneficial for you to take aspirin daily. Find healthy ways to manage stress, such as: Meditation, yoga, or listening to music. Journaling. Talking to a trusted person. Spending time with friends and family. Minimize exposure to UV radiation to reduce your risk of skin cancer. Safety Always wear your seat belt while driving or riding in a vehicle. Do not drive: If you have been drinking alcohol. Do not ride with someone who has been drinking. When you are tired or distracted. While texting. If you have been using any mind-altering substances or drugs. Wear a helmet and other protective equipment during sports activities. If you have firearms in your house, make sure you follow all gun safety procedures. Seek help if you have been physically or sexually abused. What's next? Visit your health care provider once a year for an annual wellness visit. Ask your health care provider how often you should have your eyes and teeth checked. Stay up to date on all vaccines. This information is not intended to replace advice given to you by your health care provider. Make sure you discuss any questions you have with your health care provider. Document Revised: 12/22/2020 Document Reviewed: 12/22/2020 Elsevier Patient Education  2024 [employer] Inc[employer]  

## 2023-04-19 NOTE — Progress Notes (Signed)
   Natasha Lyons 1961/02/12 [id number]   History: Postmenopausal 62 y.o. presents for annual exam. Doing well, no gyn concerns. Using yuvafem suppositories at least once weekly, often forgets the second dose. Denies any bleeding or discharge. 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 Plans for retirement in 2 years.   Gynecologic History Postmenopausal Last Pap: 2022. Results were: normal Last mammogram: 08/2022. Results were: normal Last colonoscopy: 2018   Obstetric History OB History  Gravida Para Term Preterm AB Living  0 0 0 0 0 0  SAB IAB Ectopic Multiple Live Births  0 0 0 0 0     The following portions of the patient's history were reviewed and updated as appropriate: allergies, current medications, past family history, past medical history, past social history, past surgical history, and problem list.  Review of Systems Pertinent items noted in HPI and remainder of comprehensive ROS otherwise negative.  Past medical history, past surgical history, family history and social history were all reviewed and documented in the EPIC chart.  Exam:  Vitals:   04/18/23 1610  BP: 134/86  Weight: 187 lb (84.8 kg)  Height: 5' 3.5" (1.613 m)   Body mass index is 32.61 kg/m.  General appearance:  Normal Thyroid:  Symmetrical, normal in size, without palpable masses or nodularity. Respiratory  Auscultation:  Clear without wheezing or rhonchi Cardiovascular  Auscultation:  Regular rate, without rubs, murmurs or gallops  Edema/varicosities:  Not grossly evident Abdominal  Soft,nontender, without masses, guarding or rebound.  Liver/spleen:  No organomegaly noted  Hernia:  None appreciated  Skin  Inspection:  Grossly normal Breasts: Examined lying and sitting.   Right: Without masses, retractions, nipple discharge or axillary adenopathy.   Left: Without masses, retractions, nipple discharge or axillary adenopathy. Genitourinary   Inguinal/mons:   Normal without inguinal adenopathy  External genitalia:  Normal appearing vulva with no masses, tenderness, or lesions  BUS/Urethra/Skene's glands:  Normal  Vagina:  Normal appearing with normal color and discharge, no lesions. Atrophy: moderate   Cervix:  Normal appearing without discharge or lesions  Uterus:  Normal in size, shape and contour.  Midline and mobile, nontender  Adnexa/parametria:     Rt: Normal in size, without masses or tenderness.   Lt: Normal in size, without masses or tenderness.  Anus and perineum: Normal    Raynelle Fanning, CMA present for exam  Assessment/Plan:   1. Well woman exam with routine gynecological exam Pap 2025 Mammo up to date Labs with PCP  2. Vaginal atrophy Set a reminder to take the second dose every week for maximum benefit. - Estradiol 10 MCG TABS vaginal tablet; Place 1 tablet (10 mcg total) vaginally 2 (two) times a week.  Dispense: 24 tablet; Refill: 3    Discussed SBE, colonoscopy and DEXA screening as directed. Recommend of exercise weekly, including weight bearing exercise. Encouraged the use of seatbelts and sunscreen.  Return in 1 year for annual or sooner prn.  Arlie Solomons B WHNP-BC, 7:59 AM 04/19/2023

## 2023-04-27 ENCOUNTER — Other Ambulatory Visit: Payer: Self-pay | Admitting: Emergency Medicine

## 2023-05-01 ENCOUNTER — Other Ambulatory Visit: Payer: Self-pay | Admitting: Physical Medicine & Rehabilitation

## 2023-05-04 ENCOUNTER — Encounter: Payer: BC Managed Care – PPO | Admitting: Physical Medicine & Rehabilitation

## 2023-05-17 ENCOUNTER — Ambulatory Visit: Payer: BC Managed Care – PPO | Admitting: Emergency Medicine

## 2023-05-21 ENCOUNTER — Ambulatory Visit: Payer: BC Managed Care – PPO | Admitting: Emergency Medicine

## 2023-05-21 ENCOUNTER — Encounter: Payer: Self-pay | Admitting: Physical Medicine & Rehabilitation

## 2023-05-21 ENCOUNTER — Encounter: Payer: Self-pay | Admitting: Emergency Medicine

## 2023-05-21 ENCOUNTER — Encounter
Payer: BC Managed Care – PPO | Attending: Physical Medicine & Rehabilitation | Admitting: Physical Medicine & Rehabilitation

## 2023-05-21 VITALS — BP 128/78 | HR 77 | Temp 98.2°F | Ht 63.5 in | Wt 189.6 lb

## 2023-05-21 VITALS — BP 133/87 | HR 76 | Ht 63.5 in | Wt 183.0 lb

## 2023-05-21 DIAGNOSIS — R7303 Prediabetes: Secondary | ICD-10-CM | POA: Diagnosis not present

## 2023-05-21 DIAGNOSIS — E039 Hypothyroidism, unspecified: Secondary | ICD-10-CM

## 2023-05-21 DIAGNOSIS — Z23 Encounter for immunization: Secondary | ICD-10-CM | POA: Diagnosis not present

## 2023-05-21 DIAGNOSIS — M797 Fibromyalgia: Secondary | ICD-10-CM

## 2023-05-21 DIAGNOSIS — I10 Essential (primary) hypertension: Secondary | ICD-10-CM

## 2023-05-21 DIAGNOSIS — Z1211 Encounter for screening for malignant neoplasm of colon: Secondary | ICD-10-CM

## 2023-05-21 DIAGNOSIS — L4050 Arthropathic psoriasis, unspecified: Secondary | ICD-10-CM | POA: Diagnosis not present

## 2023-05-21 DIAGNOSIS — G894 Chronic pain syndrome: Secondary | ICD-10-CM

## 2023-05-21 DIAGNOSIS — M47812 Spondylosis without myelopathy or radiculopathy, cervical region: Secondary | ICD-10-CM | POA: Diagnosis present

## 2023-05-21 DIAGNOSIS — M545 Low back pain, unspecified: Secondary | ICD-10-CM | POA: Diagnosis not present

## 2023-05-21 DIAGNOSIS — G8929 Other chronic pain: Secondary | ICD-10-CM

## 2023-05-21 DIAGNOSIS — K219 Gastro-esophageal reflux disease without esophagitis: Secondary | ICD-10-CM

## 2023-05-21 DIAGNOSIS — M150 Primary generalized (osteo)arthritis: Secondary | ICD-10-CM

## 2023-05-21 LAB — COMPREHENSIVE METABOLIC PANEL
ALT: 27 U/L (ref 0–35)
AST: 21 U/L (ref 0–37)
Albumin: 4.2 g/dL (ref 3.5–5.2)
Alkaline Phosphatase: 91 U/L (ref 39–117)
BUN: 10 mg/dL (ref 6–23)
CO2: 30 meq/L (ref 19–32)
Calcium: 9.2 mg/dL (ref 8.4–10.5)
Chloride: 106 meq/L (ref 96–112)
Creatinine, Ser: 0.58 mg/dL (ref 0.40–1.20)
GFR: 98.34 mL/min (ref >60.00)
Glucose, Bld: 112 mg/dL — ABNORMAL HIGH (ref 70–99)
Potassium: 4.4 meq/L (ref 3.5–5.1)
Sodium: 142 meq/L (ref 135–145)
Total Bilirubin: 0.4 mg/dL (ref 0.2–1.2)
Total Protein: 7.1 g/dL (ref 6.0–8.3)

## 2023-05-21 LAB — CBC WITH DIFFERENTIAL/PLATELET
Basophils Absolute: 0.1 10*3/uL (ref 0.0–0.1)
Basophils Relative: 1.1 % (ref 0.0–3.0)
Eosinophils Absolute: 0.3 10*3/uL (ref 0.0–0.7)
Eosinophils Relative: 4.4 % (ref 0.0–5.0)
HCT: 42.2 % (ref 36.0–46.0)
Hemoglobin: 13.7 g/dL (ref 12.0–15.0)
Lymphocytes Relative: 28.1 % (ref 12.0–46.0)
Lymphs Abs: 2 10*3/uL (ref 0.7–4.0)
MCHC: 32.6 g/dL (ref 30.0–36.0)
MCV: 84.4 fL (ref 78.0–100.0)
Monocytes Absolute: 0.5 10*3/uL (ref 0.1–1.0)
Monocytes Relative: 6.5 % (ref 3.0–12.0)
Neutro Abs: 4.3 10*3/uL (ref 1.4–7.7)
Neutrophils Relative %: 59.9 % (ref 43.0–77.0)
Platelets: 268 10*3/uL (ref 150.0–400.0)
RBC: 5 Mil/uL (ref 3.87–5.11)
RDW: 14.8 % (ref 11.5–15.5)
WBC: 7.2 10*3/uL (ref 4.0–10.5)

## 2023-05-21 LAB — LIPID PANEL
Cholesterol: 167 mg/dL (ref 0–200)
HDL: 53.3 mg/dL (ref >39.00)
LDL Cholesterol: 91 mg/dL (ref 0–99)
NonHDL: 114.14
Total CHOL/HDL Ratio: 3
Triglycerides: 117 mg/dL (ref 0.0–149.0)
VLDL: 23.4 mg/dL (ref 0.0–40.0)

## 2023-05-21 LAB — TSH: TSH: 1.97 u[IU]/mL (ref 0.35–5.50)

## 2023-05-21 LAB — HEMOGLOBIN A1C: Hgb A1c MFr Bld: 5.9 % (ref 4.6–6.5)

## 2023-05-21 MED ORDER — DULOXETINE HCL 60 MG PO CPEP
60.0000 mg | ORAL_CAPSULE | Freq: Every day | ORAL | 3 refills | Status: DC
Start: 2023-05-21 — End: 2024-06-04

## 2023-05-21 MED ORDER — TIZANIDINE HCL 2 MG PO TABS: 2.0000 mg | ORAL_TABLET | Freq: Three times a day (TID) | ORAL | 2 refills | Status: DC | PRN

## 2023-05-21 NOTE — Patient Instructions (Signed)
Hypertension, Adult High blood pressure (hypertension) is when the force of blood pumping through the arteries is too strong. The arteries are the blood vessels that carry blood from the heart throughout the body. Hypertension forces the heart to work harder to pump blood and may cause arteries to become narrow or stiff. Untreated or uncontrolled hypertension can lead to a heart attack, heart failure, a stroke, kidney disease, and other problems. A blood pressure reading consists of a higher number over a lower number. Ideally, your blood pressure should be below 120/80. The first ("top") number is called the systolic pressure. It is a measure of the pressure in your arteries as your heart beats. The second ("bottom") number is called the diastolic pressure. It is a measure of the pressure in your arteries as the heart relaxes. What are the causes? The exact cause of this condition is not known. There are some conditions that result in high blood pressure. What increases the risk? Certain factors may make you more likely to develop high blood pressure. Some of these risk factors are under your control, including: Smoking. Not getting enough exercise or physical activity. Being overweight. Having too much fat, sugar, calories, or salt (sodium) in your diet. Drinking too much alcohol. Other risk factors include: Having a personal history of heart disease, diabetes, high cholesterol, or kidney disease. Stress. Having a family history of high blood pressure and high cholesterol. Having obstructive sleep apnea. Age. The risk increases with age. What are the signs or symptoms? High blood pressure may not cause symptoms. Very high blood pressure (hypertensive crisis) may cause: Headache. Fast or irregular heartbeats (palpitations). Shortness of breath. Nosebleed. Nausea and vomiting. Vision changes. Severe chest pain, dizziness, and seizures. How is this diagnosed? This condition is diagnosed by  measuring your blood pressure while you are seated, with your arm resting on a flat surface, your legs uncrossed, and your feet flat on the floor. The cuff of the blood pressure monitor will be placed directly against the skin of your upper arm at the level of your heart. Blood pressure should be measured at least twice using the same arm. Certain conditions can cause a difference in blood pressure between your right and left arms. If you have a high blood pressure reading during one visit or you have normal blood pressure with other risk factors, you may be asked to: Return on a different day to have your blood pressure checked again. Monitor your blood pressure at home for 1 week or longer. If you are diagnosed with hypertension, you may have other blood or imaging tests to help your health care provider understand your overall risk for other conditions. How is this treated? This condition is treated by making healthy lifestyle changes, such as eating healthy foods, exercising more, and reducing your alcohol intake. You may be referred for counseling on a healthy diet and physical activity. Your health care provider may prescribe medicine if lifestyle changes are not enough to get your blood pressure under control and if: Your systolic blood pressure is above 130. Your diastolic blood pressure is above 80. Your personal target blood pressure may vary depending on your medical conditions, your age, and other factors. Follow these instructions at home: Eating and drinking  Eat a diet that is high in fiber and potassium, and low in sodium, added sugar, and fat. An example of this eating plan is called the DASH diet. DASH stands for Dietary Approaches to Stop Hypertension. To eat this way: Eat   plenty of fresh fruits and vegetables. Try to fill one half of your plate at each meal with fruits and vegetables. Eat whole grains, such as whole-wheat pasta, brown rice, or whole-grain bread. Fill about one  fourth of your plate with whole grains. Eat or drink low-fat dairy products, such as skim milk or low-fat yogurt. Avoid fatty cuts of meat, processed or cured meats, and poultry with skin. Fill about one fourth of your plate with lean proteins, such as fish, chicken without skin, beans, eggs, or tofu. Avoid pre-made and processed foods. These tend to be higher in sodium, added sugar, and fat. Reduce your daily sodium intake. Many people with hypertension should eat less than 1,500 mg of sodium a day. Do not drink alcohol if: Your health care provider tells you not to drink. You are pregnant, may be pregnant, or are planning to become pregnant. If you drink alcohol: Limit how much you have to: 0-1 drink a day for women. 0-2 drinks a day for men. Know how much alcohol is in your drink. In the [place] one drink equals one 12 oz bottle of beer (355 mL), one 5 oz glass of wine (148 mL), or one 1 oz glass of hard liquor (44 mL). Lifestyle  Work with your health care provider to maintain a healthy body weight or to lose weight. Ask what an ideal weight is for you. Get at least 30 minutes of exercise that causes your heart to beat faster (aerobic exercise) most days of the week. Activities may include walking, swimming, or biking. Include exercise to strengthen your muscles (resistance exercise), such as Pilates or lifting weights, as part of your weekly exercise routine. Try to do these types of exercises for 30 minutes at least 3 days a week. Do not use any products that contain nicotine or tobacco. These products include cigarettes, chewing tobacco, and vaping devices, such as e-cigarettes. If you need help quitting, ask your health care provider. Monitor your blood pressure at home as told by your health care provider. Keep all follow-up visits. This is important. Medicines Take over-the-counter and prescription medicines only as told by your health care provider. Follow directions carefully. Blood  pressure medicines must be taken as prescribed. Do not skip doses of blood pressure medicine. Doing this puts you at risk for problems and can make the medicine less effective. Ask your health care provider about side effects or reactions to medicines that you should watch for. Contact a health care provider if you: Think you are having a reaction to a medicine you are taking. Have headaches that keep coming back (recurring). Feel dizzy. Have swelling in your ankles. Have trouble with your vision. Get help right away if you: Develop a severe headache or confusion. Have unusual weakness or numbness. Feel faint. Have severe pain in your chest or abdomen. Vomit repeatedly. Have trouble breathing. These symptoms may be an emergency. Get help right away. Call 911. Do not wait to see if the symptoms will go away. Do not drive yourself to the hospital. Summary Hypertension is when the force of blood pumping through your arteries is too strong. If this condition is not controlled, it may put you at risk for serious complications. Your personal target blood pressure may vary depending on your medical conditions, your age, and other factors. For most people, a normal blood pressure is less than 120/80. Hypertension is treated with lifestyle changes, medicines, or a combination of both. Lifestyle changes include losing weight, eating a healthy,   low-sodium diet, exercising more, and limiting alcohol. This information is not intended to replace advice given to you by your health care provider. Make sure you discuss any questions you have with your health care provider. Document Revised: 05/03/2021 Document Reviewed: 05/03/2021 Elsevier Patient Education  2024 [employer]  

## 2023-05-21 NOTE — Progress Notes (Signed)
Subjective:    Patient ID: Natasha Lyons, female    DOB: 14-Aug-1960, 62 y.o.   MRN: [id number]  HPI   Natasha Lyons is a 62 y.o. year old female  who  has a past medical history of Allergy, Anemia, Anxiety, Arthritis, Asthma, Back pain, Bilateral swelling of feet, Chronic back pain, Constipation, Cough, Depression, Family history of adverse reaction to anesthesia, Fibromyalgia, Fibromyalgia, Gallbladder problem, GERD (gastroesophageal reflux disease), Headache, History of bronchitis (2016), History of shingles, Hypertension, Hypothyroid, Infertility, female, Joint pain, Joint pain, Joint swelling, Muscle spasm, Osteoarthritis, Osteoarthritis, Palpitations, Pneumonia, Prediabetes, Psoriasis, Psoriatic arthritis (HCC), Shortness of breath, and Vitamin D deficiency.   They are presenting to PM&R clinic as a new patient for pain management evaluation.  Natasha Lyons reports a history of psoriatic arthritis.  She has a lot of pain in her shoulders and back..  She has pain that radiates down her legs pain began about 20 years ago but has been worsening over time.  Her rheumatologist has also diagnosed with fibromyalgia.  She has had a prior TKR on the left and also on the right knee.  Pain is worsened by cold.  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  Her activity is decreased since her knee surgeries.  She reports some weight gain due to decreased activity.  Mood is often decreased.  She sleeps poorly most nights.  Has issues with occasional constipation.     No red flags for back pain endorsed in Hx or ROS, saddle anesthesia, loss of bowel or bladder continence, new weakness, new numbness/tingling, and pain waking up at nighttime.   Medications tried: Topical medications voltaren gel Nsaids Naproxen- did not help, celebrex- minimal help, Voltaren gel helps Tylenol -takes all the time, helps a little Gabapentin- did not help Lyrica-appears she  had a low-dose of Lyrica in the past TCAs  -amitriptyline made her sleepy SNRIs  -Has not tried  Other  Tizanidine - does help a little, helps her sleep   Other treatments: PT/OT - after surgery, helped Accupuncture/chiropractor/massage - chiro treatment helped lower back, didn't feel comfortable on neck TENs unit - Used it a long time ago Injections -  Cortisone injection in back- helped for a long time  Surgery - TKA- both sides Other - heating bad helps     Interval History 11/02/22 She is here today for follow-up regarding her pain.  Reports her overall pain is doing better with duloxetine.  She initially had difficulty taking this medication is causing insomnia when she took it in the afternoon.  She started taking it in the morning and has been doing better.  She continues to have neck and lower back pain.  She also has joint pain in her knees and shoulders.  Her overall pain however is doing better since she started on this medicine.  She has been able to exercise more, doing stretches walking her dog and using an exercise bike.  She reports her mood is a little better as well.     Interval history 02/02/2023 Patient is here for follow-up regarding chronic pain and fibromyalgia.  Patient reports she continues to have soreness throughout multiple areas of her body.  She continues to have pain in her lower back and neck.  She does feel duloxetine is helping reduce her overall pain.  She is not having any side effects with duloxetine.  While she continues to have pain,  she reports she is more active and doing a lot more than she used to previously.  TENS unit is also helping her overall pain.  She feels that her mood is doing okay overall.  She says she has lost weight.   Interval history 05/21/2023 Natasha Lyons reports that and the nerve type pain throughout her body is much decreased.  She feels like the duloxetine is helping control this type of pain.  She is not having any side effects with  duloxetine.  She also continues to use her TENS unit to reduce her pain level.  She continues to have pain in her lower back and neck, however it is not limiting her activities and under control overall.  She continues to work on walking and doing band exercises with her upper extremities at home.  She has been successful in losing more weight since last visit.     Pain Inventory Average Pain 8 Pain Right Now 6 My pain is dull and aching  In the last 24 hours, has pain interfered with the following? General activity 4 Relation with others 4 Enjoyment of life 4 What TIME of day is your pain at its worst? morning  Sleep (in general) Fair  Pain is worse with: inactivity Pain improves with: heat/ice, therapy/exercise, and TENS Relief from Meds: 8  Family History  Problem Relation Age of Onset   Breast cancer Mother    Diabetes Mother    Hypertension Mother    Hyperlipidemia Mother    Heart disease Mother    Stroke Mother    Kidney disease Mother    Thyroid disease Mother    Obesity Mother    Hypertension Father    Social History   Socioeconomic History   Marital status: Married    Spouse name: Not on file   Number of children: 1   Years of education: Not on file   Highest education level: Not on file  Occupational History   Not on file  Tobacco Use   Smoking status: Never    Passive exposure: Past   Smokeless tobacco: Never  Vaping Use   Vaping status: Never Used  Substance and Sexual Activity   Alcohol use: Yes    Comment: occ   Drug use: No   Sexual activity: Not Currently    Partners: Male    Birth control/protection: Post-menopausal    Comment: menarche 62yo, sexual debut 62yo  Other Topics Concern   Not on file  Social History Narrative   Not on file   Social Determinants of Health   Financial Resource Strain: Not on file  Food Insecurity: Not on file  Transportation Needs: Not on file  Physical Activity: Not on file  Stress: Not on file  Social  Connections: Not on file   Past Surgical History:  Procedure Laterality Date   APPENDECTOMY     BIOPSY THYROID  2014   results benign   CHOLECYSTECTOMY  2008   DIAGNOSTIC LAPAROSCOPY     d/c endometriosis   DILATION AND CURETTAGE OF UTERUS     INSERTION OF MESH N/A 06/15/2016   Procedure: INSERTION OF MESH;  Surgeon: Chevis Pretty III, MD;  Location: [facility];  Service: General;  Laterality: N/A;   KNEE ARTHROSCOPY Bilateral 2011   MASS EXCISION Right 04/21/2014   Procedure: EXCISION OF RIGHT BACK MASS;  Surgeon: Abigail Miyamoto, MD;  Location: [facility];  Service: General;  Laterality: Right;   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  REPLACEMENT TOTAL KNEE Left    05/2022   TONSILLECTOMY     VENTRAL HERNIA REPAIR  06/15/2016   VENTRAL HERNIA REPAIR N/A 06/15/2016   Procedure: LAPAROSCOPIC VENTRAL HERNIA WITH MESH;  Surgeon: Chevis Pretty III, MD;  Location: [facility];  Service: General;  Laterality: N/A;   Past Surgical History:  Procedure Laterality Date   APPENDECTOMY     BIOPSY THYROID  2014   results benign   CHOLECYSTECTOMY  2008   DIAGNOSTIC LAPAROSCOPY     d/c endometriosis   DILATION AND CURETTAGE OF UTERUS     INSERTION OF MESH N/A 06/15/2016   Procedure: INSERTION OF MESH;  Surgeon: Chevis Pretty III, MD;  Location: [facility];  Service: General;  Laterality: N/A;   KNEE ARTHROSCOPY Bilateral 2011   MASS EXCISION Right 04/21/2014   Procedure: EXCISION OF RIGHT BACK MASS;  Surgeon: Abigail Miyamoto, MD;  Location: [facility];  Service: General;  Laterality: Right;   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   REPLACEMENT TOTAL KNEE Left    05/2022   TONSILLECTOMY     VENTRAL HERNIA REPAIR  06/15/2016   VENTRAL HERNIA REPAIR N/A 06/15/2016   Procedure: LAPAROSCOPIC VENTRAL HERNIA WITH MESH;  Surgeon: Chevis Pretty III, MD;  Location: [facility];  Service: General;  Laterality: N/A;   Past Medical History:  Diagnosis Date   Allergy    takes Zyrtec daily    Anemia    Anxiety    Arthritis    psorriatric arthritis   Asthma    Albuterol as needed as well as neb as needed   Back pain    Bilateral swelling of feet    Chronic back pain    pinched nerve   Constipation    Cough    Depression    Family history of adverse reaction to anesthesia    pts dad gets sick with anesthesia   Fibromyalgia    takes Effexor daily   Fibromyalgia    Gallbladder problem    GERD (gastroesophageal reflux disease)    Headache    seasonal    History of bronchitis 2016   History of shingles    Hypertension    takes Amlodipine daily   Hypothyroid    Infertility, female    Joint pain    Joint pain    Joint swelling    Muscle spasm    takes Robaxin as needed   Osteoarthritis    Osteoarthritis    Palpitations    Pneumonia    hx of-10+ yrs ago   Prediabetes    Psoriasis    Psoriatic arthritis (HCC)    Shortness of breath    Vitamin D deficiency    BP 133/87   Pulse 76   Ht 5' 3.5" (1.613 m)   Wt 183 lb (83 kg)   LMP 07/10/2018   SpO2 94%   BMI 31.91 kg/m   Opioid Risk Score:   Fall Risk Score:  `1  Depression screen [facility] 2/9     05/21/2023   10:08 AM 02/02/2023   10:07 AM 11/15/2022    3:39 PM 11/02/2022    9:44 AM 08/29/2022    9:20 AM 08/08/2022    9:58 AM 05/09/2022    8:33 AM  Depression screen PHQ 2/9  Decreased Interest 0 0 0 0 1 0 0  Down, Depressed, Hopeless 0 0 0 0 1 0 0  PHQ - 2 Score 0 0 0 0 2 0 0  Altered sleeping  1    Tired, decreased energy     1    Change in appetite     0    Feeling bad or failure about yourself      0    Trouble concentrating     0    Moving slowly or fidgety/restless     0    Suicidal thoughts     0    PHQ-9 Score     4    Difficult doing work/chores     Not difficult at all        Review of Systems  Musculoskeletal:  Positive for back pain and neck pain.       B/L shoulder pain  All other systems reviewed and are negative.     Objective:   Physical Exam  Gen: no distress, normal  appearing HEENT: oral mucosa pink and moist, NCAT Chest: normal effort, normal rate of breathing Abd: soft, non-distended Ext: no edema Psych: pleasant, normal affect Skin: intact Neuro: Alert and oriented, follows commands, cranial nerves II through XII grossly intact, normal speech and language Strength 5/5 B/L UE and LE Sensation tact light touch in all 4 extremities Musculoskeletal:  Slump test negative Spurling's negative Tenderness throughout paraspinal muscles C-spine, T-spine, L-spine Periscapular muscles and deltoid muscles TTP Not significant  TTP in UE and LE    MRI L spine 12/23/21 IMPRESSION: Multilevel degenerative changes as detailed above. No high-grade canal stenosis. Facet arthropathy with prominent joint effusions at L4-L5. There is increased mild to moderate foraminal narrowing at L4-L5. Similar moderate left foraminal narrowing at L5-S1 and potential displacement of the extraforaminal left L5 nerve root at this level.   MRI C spine 11/24/21 IMPRESSION: Multilevel degenerative changes as detailed above with minor progression since the prior examination. No significant canal narrowing. Foraminal narrowing is again greatest on the right at C6-C7.        Assessment & Plan:   Assessment and Plan: Natasha Lyons is a 62 y.o. year old female  who  has a past medical history of Allergy, Anemia, Anxiety, Arthritis, Asthma, Back pain, Bilateral swelling of feet, Chronic back pain, Constipation, Cough, Depression, Family history of adverse reaction to anesthesia, Fibromyalgia, Fibromyalgia, Gallbladder problem, GERD (gastroesophageal reflux disease), Headache, History of bronchitis (2016), History of shingles, Hypertension, Hypothyroid, Infertility, female, Joint pain, Joint pain, Joint swelling, Muscle spasm, Osteoarthritis, Osteoarthritis, Palpitations, Pneumonia, Prediabetes, Psoriasis, Psoriatic arthritis (HCC), Shortness of breath, and Vitamin D deficiency.    They are presenting to PM&R clinic as a new patient for treatment of chronic pain syndrome   Fibromyalgia Continue gradual progressive low impact exercises Consider trying yoga or thai chi Continue Nexwave- Helping pain per patient report  We previously discussed Lyrica, pt did not try due to concern of wt gain Continue duloxetine to 60 mg daily Discussed Theracane prior visit Discussed food options that can help pain prior visit Consider shockwave therapy at later visit   Chronic back and neck pain with lumbar and cervical spondylosis Consider consult physical therapy at later time if she has time to do this Previously had improvements with ESI -Continue Tizanidine PRN -ordered   Psoriatic arthritis -Continue follow-up with rheumatology, she is currently on methotrexate   Mood disorder -She is currently on bupropion -Continue duloxetine 60mg  as above

## 2023-05-21 NOTE — Assessment & Plan Note (Signed)
Much improved after starting Cymbalta 60 mg daily

## 2023-05-21 NOTE — Assessment & Plan Note (Signed)
Stable and well-controlled Follows up with rheumatologist on a regular basis Rheumatology medications handled by their office

## 2023-05-21 NOTE — Assessment & Plan Note (Signed)
Well-controlled hypertension Continue amlodipine 5 mg daily Cardiovascular risks associated with hypertension discussed Dietary approaches to stop hypertension discussed Benefits of exercise discussed

## 2023-05-21 NOTE — Assessment & Plan Note (Signed)
Clinically stable and asymptomatic.  No concerns

## 2023-05-21 NOTE — Progress Notes (Signed)
Natasha Lyons 62 y.o.   Chief Complaint  Patient presents with   Medical Management of Chronic Issues    6 month f/u for HTN. Also needs 2nd shingle vaccine. Patient c/o of sinus headache for a couple of weeks    HISTORY OF PRESENT ILLNESS: This is a 63 y.o. female here for 103-month follow-up of chronic medical conditions Chronic pain much improved after starting Cymbalta 60 mg daily Sees rheumatologist on a regular basis for rheumatologic conditions including psoriatic arthritis Eating better and exercising more. No other complaints or medical concerns today BP Readings from Last 3 Encounters:  05/21/23 133/87  04/18/23 134/86  02/02/23 126/74   Wt Readings from Last 3 Encounters:  05/21/23 189 lb 9.6 oz (86 kg)  05/21/23 183 lb (83 kg)  04/18/23 187 lb (84.8 kg)     HPI   Prior to Admission medications   Medication Sig Start Date End Date Taking? Authorizing Provider  Acetaminophen (TYLENOL 8 HOUR PO) Take by mouth.   Yes [provider]  albuterol (PROVENTIL) (2.5 MG/3ML) 0.083% nebulizer solution Take 3 mLs (2.5 mg total) by nebulization every 6 (six) hours as needed for wheezing or shortness of breath. 02/01/22  Yes Tarika Mckethan, Eilleen Kempf, MD  albuterol (VENTOLIN HFA) 108 (90 Base) MCG/ACT inhaler Inhale 2 puffs into the lungs every 6 (six) hours as needed for wheezing.   Yes [provider]  amLODipine (NORVASC) 5 MG tablet TAKE 1 TABLET (5 MG TOTAL) BY MOUTH DAILY. 04/27/23  Yes SagardiaEilleen Kempf, MD  ARMOUR THYROID 15 MG tablet Take 15 mg by mouth daily. 10/28/19  Yes [provider]  BD PEN NEEDLE NANO 2ND GEN 32G X 4 MM MISC USE ONCE A WEEK 04/26/20  Yes Helane Rima, DO  blood glucose meter kit and supplies KIT Dispense based on patient and insurance preference. Use up to four times daily as directed. 03/16/21  Yes Whitmire, Dawn, FNP  buPROPion (WELLBUTRIN XL) 150 MG 24 hr tablet Take 1 tablet (150 mg total) by mouth daily.  08/08/22  Yes Ladd Cen, Eilleen Kempf, MD  cetirizine (ZYRTEC) 10 MG tablet Take 10 mg by mouth daily.   Yes [provider]  clobetasol ointment (TEMOVATE) 0.05 % Apply 1 application topically 2 (two) times daily. 08/11/19  Yes [provider]  DULoxetine (CYMBALTA) 60 MG capsule Take 1 capsule (60 mg total) by mouth daily. 05/21/23  Yes Fanny Dance, MD  Estradiol 10 MCG TABS vaginal tablet Place 1 tablet (10 mcg total) vaginally 2 (two) times a week. 04/19/23  Yes Chrzanowski, Jami B, NP  fluticasone (FLONASE) 50 MCG/ACT nasal spray Place 1 spray into both nostrils daily. 03/30/23  Yes Cayman Brogden, Eilleen Kempf, MD  folic acid (FOLVITE) 800 MCG tablet Take 800 mcg by mouth daily.    Yes [provider]  methotrexate (RHEUMATREX) 2.5 MG tablet Take 20 mg by mouth every Sunday. Caution:Chemotherapy. Protect from light.   Yes [provider]  montelukast (SINGULAIR) 10 MG tablet TAKE 1 TABLET BY MOUTH EVERYDAY AT BEDTIME 01/05/23  Yes Zion Lint, Eilleen Kempf, MD  phentermine (ADIPEX-P) 37.5 MG tablet 1 tablet before breakfast by mouth Once a day for 30 days 08/10/22  Yes [provider]  Secukinumab 150 MG/ML SOSY Inject 300 mg into the skin every 30 (thirty) days.   Yes [provider]  tiZANidine (ZANAFLEX) 2 MG tablet Take 1 tablet (2 mg total) by mouth every 8 (eight) hours as needed for muscle spasms. 05/21/23  Yes  Fanny Dance, MD  topiramate (TOPAMAX) 25 MG tablet TAKE 1 TABLET BY MOUTH EVERY DAY for 90   Yes [provider]  Vitamin D, Ergocalciferol, (DRISDOL) 1.25 MG (50000 UNIT) CAPS capsule Take 1 capsule (50,000 Units total) by mouth every 7 (seven) days. 09/08/21  Yes Alois Cliche, PA-C  WIXELA INHUB 250-50 MCG/ACT AEPB INHALE 1 PUFF INTO THE LUNGS IN THE MORNING AND AT BEDTIME. 03/31/23  Yes Reyes Aldaco, Eilleen Kempf, MD    Allergies  Allergen Reactions   Dust Mite Extract Shortness Of Breath   Grass Extracts [Gramineae Pollens]  Shortness Of Breath   Tree Extract Shortness Of Breath    Certain tree allergies    Patient Active Problem List   Diagnosis Date Noted   Seasonal allergies 01/04/2022   Primary osteoarthritis involving multiple joints 01/04/2022   Chronic idiopathic constipation 09/21/2021   Gastro-esophageal reflux disease without esophagitis 09/21/2021   Cervical spondylosis without myelopathy 03/07/2021   Joint pain 03/07/2021   Osteoarthritis 03/07/2021   Fibromyalgia 03/07/2021   H/O total knee replacement, 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   Depression 04/05/2020   Other specified anxiety disorders 04/05/2020   Chronic pain of right knee 04/05/2020   Prediabetes 02/09/2020   History of endometriosis 02/06/2020   Osteoporosis 01/06/2020   Nonallopathic lesion of cervical region 04/24/2016   Nonallopathic lesion of rib cage 04/24/2016   SI (sacroiliac) joint dysfunction 03/21/2016   Nonallopathic lesion of sacral region 03/21/2016   Nonallopathic lesion of lumbosacral region 03/21/2016   Nonallopathic lesion of thoracic region 03/21/2016   Rheumatoid bursitis, 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/2015   Psoriatic arthritis (HCC) 04/07/2015   Chronic pain syndrome 04/07/2015   Psoriasis 08/10/2013   Vitamin D deficiency 08/10/2013   Thyroid nodule 05/02/2013   Essential hypertension 03/06/2013   Acquired hypothyroidism 03/06/2013   Extrinsic asthma, mild, intermittent 03/06/2013   Class 2 severe obesity with serious comorbidity and body mass index (BMI) of 36.0 to 36.9 in adult [facility]) 03/06/2013    Past Medical History:  Diagnosis Date   Allergy    takes Zyrtec daily   Anemia    Anxiety    Arthritis    psorriatric arthritis   Asthma    Albuterol as needed as well as neb as needed   Back pain    Bilateral swelling of feet    Chronic back pain    pinched nerve   Constipation    Cough    Depression    Family history of adverse reaction to anesthesia    pts dad gets sick with anesthesia   Fibromyalgia     takes Effexor daily   Fibromyalgia    Gallbladder problem    GERD (gastroesophageal reflux disease)    Headache    seasonal    History of bronchitis 2016   History of shingles    Hypertension    takes Amlodipine daily   Hypothyroid    Infertility, female    Joint pain    Joint pain    Joint swelling    Muscle spasm    takes Robaxin as needed   Osteoarthritis    Osteoarthritis    Palpitations    Pneumonia    hx of-10+ yrs ago   Prediabetes    Psoriasis    Psoriatic arthritis (HCC)    Shortness of breath    Vitamin D deficiency     Past Surgical History:  Procedure Laterality Date   APPENDECTOMY     BIOPSY THYROID  2014   results  benign   CHOLECYSTECTOMY  2008   DIAGNOSTIC LAPAROSCOPY     d/c endometriosis   DILATION AND CURETTAGE OF UTERUS     INSERTION OF MESH N/A 06/15/2016   Procedure: INSERTION OF MESH;  Surgeon: Chevis Pretty III, MD;  Location: [facility] OR;  Service: General;  Laterality: N/A;   KNEE ARTHROSCOPY Bilateral 2011   MASS EXCISION Right 04/21/2014   Procedure: EXCISION OF RIGHT BACK MASS;  Surgeon: Abigail Miyamoto, MD;  Location:  SURGERY CENTER;  Service: General;  Laterality: Right;   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   REPLACEMENT TOTAL KNEE Left    05/2022   TONSILLECTOMY     VENTRAL HERNIA REPAIR  06/15/2016   VENTRAL HERNIA REPAIR N/A 06/15/2016   Procedure: LAPAROSCOPIC VENTRAL HERNIA WITH MESH;  Surgeon: Chevis Pretty III, MD;  Location: [facility] OR;  Service: General;  Laterality: N/A;    Social History   Socioeconomic History   Marital status: Married    Spouse name: Not on file   Number of children: 1   Years of education: Not on file   Highest education level: Not on file  Occupational History   Not on file  Tobacco Use   Smoking status: Never    Passive exposure: Past   Smokeless tobacco: Never  Vaping Use   Vaping status: Never Used  Substance and Sexual Activity   Alcohol use: Yes    Comment: occ   Drug use: No    Sexual activity: Not Currently    Partners: Male    Birth control/protection: Post-menopausal    Comment: menarche 62yo, sexual debut 62yo  Other Topics Concern   Not on file  Social History Narrative   Not on file   Social Determinants of Health   Financial Resource Strain: Not on file  Food Insecurity: Not on file  Transportation Needs: Not on file  Physical Activity: Not on file  Stress: Not on file  Social Connections: Not on file  Intimate Partner Violence: Not on file    Family History  Problem Relation Age of Onset   Breast cancer Mother    Diabetes Mother    Hypertension Mother    Hyperlipidemia Mother    Heart disease Mother    Stroke Mother    Kidney disease Mother    Thyroid disease Mother    Obesity Mother    Hypertension Father      Review of Systems  Constitutional: Negative.  Negative for chills and fever.  HENT:  Positive for congestion. Negative for sore throat.   Respiratory: Negative.  Negative for cough and shortness of breath.   Cardiovascular: Negative.  Negative for chest pain and palpitations.  Gastrointestinal:  Negative for abdominal pain, diarrhea, nausea and vomiting.  Genitourinary: Negative.  Negative for dysuria and hematuria.  Musculoskeletal:  Positive for joint pain.  Skin: Negative.  Negative for rash.  Neurological:  Positive for headaches.  All other systems reviewed and are negative.   Today's Vitals   05/21/23 1302  BP: 128/78  Pulse: 77  Temp: 98.2 F (36.8 C)  TempSrc: Oral  SpO2: 96%  Weight: 189 lb 9.6 oz (86 kg)  Height: 5' 3.5" (1.613 m)   Body mass index is 33.06 kg/m.   Physical Exam Vitals reviewed.  Constitutional:      Appearance: Normal appearance.  HENT:     Head: Normocephalic.     Mouth/Throat:     Mouth: Mucous membranes are moist.     Pharynx: Oropharynx  is clear.  Eyes:     Extraocular Movements: Extraocular movements intact.     Pupils: Pupils are equal, round, and reactive to light.   Cardiovascular:     Rate and Rhythm: Normal rate and regular rhythm.     Pulses: Normal pulses.     Heart sounds: Normal heart sounds.  Pulmonary:     Effort: Pulmonary effort is normal.     Breath sounds: Normal breath sounds.  Musculoskeletal:     Cervical back: No tenderness.     Right lower leg: No edema.     Left lower leg: No edema.  Lymphadenopathy:     Cervical: No cervical adenopathy.  Skin:    General: Skin is warm and dry.     Findings: Rash present.     Comments: Psoriasis rashes to elbow areas  Neurological:     Mental Status: She is alert and oriented to person, place, and time.  Psychiatric:        Mood and Affect: Mood normal.        Behavior: Behavior normal.      ASSESSMENT & PLAN: A total of 45 minutes was spent with the patient and counseling/coordination of care regarding preparing for this visit, review of most recent office visit notes, review of multiple chronic medical conditions under management, review of all medications, review of most recent blood work results, cardiovascular risks associated with hypertension and prediabetes, education on nutrition, pain management, review of health maintenance items including need for colon cancer screening, prognosis, documentation, and need for follow-up.  Problem List Items Addressed This Visit       Cardiovascular and Mediastinum   Essential hypertension - Primary    Well-controlled hypertension Continue amlodipine 5 mg daily Cardiovascular risks associated with hypertension discussed Dietary approaches to stop hypertension discussed Benefits of exercise discussed      Relevant Orders   CBC with Differential/Platelet   Comprehensive metabolic panel   Lipid panel     Digestive   Gastro-esophageal reflux disease without esophagitis    Clinically stable and asymptomatic.  No concerns        Endocrine   Acquired hypothyroidism    Clinically euthyroid. TSH done today Continues Armour Thyroid 15  mg daily      Relevant Orders   TSH     Musculoskeletal and Integument   Psoriatic arthritis (HCC)    Stable and well-controlled Follows up with rheumatologist on a regular basis Rheumatology medications handled by their office      Relevant Orders   CBC with Differential/Platelet   Primary osteoarthritis involving multiple joints    Stable and well-controlled.        Other   Chronic pain syndrome    Much improved after starting Cymbalta 60 mg daily      Prediabetes    Diet and nutrition discussed Hemoglobin A1c done today Advised to continue decreasing amount of daily carbohydrate intake and daily calories and increase amount of plant-based protein in her diet      Relevant Orders   Hemoglobin A1c   Comprehensive metabolic panel   Lipid panel   Other Visit Diagnoses     Screening for colon cancer       Relevant Orders   Ambulatory referral to Gastroenterology      Patient Instructions  Hypertension, Adult High blood pressure (hypertension) is when the force of blood pumping through the arteries is too strong. The arteries are the blood vessels that carry blood from the heart  throughout the body. Hypertension forces the heart to work harder to pump blood and may cause arteries to become narrow or stiff. Untreated or uncontrolled hypertension can lead to a heart attack, heart failure, a stroke, kidney disease, and other problems. A blood pressure reading consists of a higher number over a lower number. Ideally, your blood pressure should be below 120/80. The first ("top") number is called the systolic pressure. It is a measure of the pressure in your arteries as your heart beats. The second ("bottom") number is called the diastolic pressure. It is a measure of the pressure in your arteries as the heart relaxes. What are the causes? The exact cause of this condition is not known. There are some conditions that result in high blood pressure. What increases the  risk? Certain factors may make you more likely to develop high blood pressure. Some of these risk factors are under your control, including: Smoking. Not getting enough exercise or physical activity. Being overweight. Having too much fat, sugar, calories, or salt (sodium) in your diet. Drinking too much alcohol. Other risk factors include: Having a personal history of heart disease, diabetes, high cholesterol, or kidney disease. Stress. Having a family history of high blood pressure and high cholesterol. Having obstructive sleep apnea. Age. The risk increases with age. What are the signs or symptoms? High blood pressure may not cause symptoms. Very high blood pressure (hypertensive crisis) may cause: Headache. Fast or irregular heartbeats (palpitations). Shortness of breath. Nosebleed. Nausea and vomiting. Vision changes. Severe chest pain, dizziness, and seizures. How is this diagnosed? This condition is diagnosed by measuring your blood pressure while you are seated, with your arm resting on a flat surface, your legs uncrossed, and your feet flat on the floor. The cuff of the blood pressure monitor will be placed directly against the skin of your upper arm at the level of your heart. Blood pressure should be measured at least twice using the same arm. Certain conditions can cause a difference in blood pressure between your right and left arms. If you have a high blood pressure reading during one visit or you have normal blood pressure with other risk factors, you may be asked to: Return on a different day to have your blood pressure checked again. Monitor your blood pressure at home for 1 week or longer. If you are diagnosed with hypertension, you may have other blood or imaging tests to help your health care provider understand your overall risk for other conditions. How is this treated? This condition is treated by making healthy lifestyle changes, such as eating healthy foods,  exercising more, and reducing your alcohol intake. You may be referred for counseling on a healthy diet and physical activity. Your health care provider may prescribe medicine if lifestyle changes are not enough to get your blood pressure under control and if: Your systolic blood pressure is above 130. Your diastolic blood pressure is above 80. Your personal target blood pressure may vary depending on your medical conditions, your age, and other factors. Follow these instructions at home: Eating and drinking  Eat a diet that is high in fiber and potassium, and low in sodium, added sugar, and fat. An example of this eating plan is called the DASH diet. DASH stands for Dietary Approaches to Stop Hypertension. To eat this way: Eat plenty of fresh fruits and vegetables. Try to fill one half of your plate at each meal with fruits and vegetables. Eat whole grains, such as whole-wheat pasta, brown rice,  or whole-grain bread. Fill about one fourth of your plate with whole grains. Eat or drink low-fat dairy products, such as skim milk or low-fat yogurt. Avoid fatty cuts of meat, processed or cured meats, and poultry with skin. Fill about one fourth of your plate with lean proteins, such as fish, chicken without skin, beans, eggs, or tofu. Avoid pre-made and processed foods. These tend to be higher in sodium, added sugar, and fat. Reduce your daily sodium intake. Many people with hypertension should eat less than 1,500 mg of sodium a day. Do not drink alcohol if: Your health care provider tells you not to drink. You are pregnant, may be pregnant, or are planning to become pregnant. If you drink alcohol: Limit how much you have to: 0-1 drink a day for women. 0-2 drinks a day for men. Know how much alcohol is in your drink. In the [place] one drink equals one 12 oz bottle of beer (355 mL), one 5 oz glass of wine (148 mL), or one 1 oz glass of hard liquor (44 mL). Lifestyle  Work with your health care  provider to maintain a healthy body weight or to lose weight. Ask what an ideal weight is for you. Get at least 30 minutes of exercise that causes your heart to beat faster (aerobic exercise) most days of the week. Activities may include walking, swimming, or biking. Include exercise to strengthen your muscles (resistance exercise), such as Pilates or lifting weights, as part of your weekly exercise routine. Try to do these types of exercises for 30 minutes at least 3 days a week. Do not use any products that contain nicotine or tobacco. These products include cigarettes, chewing tobacco, and vaping devices, such as e-cigarettes. If you need help quitting, ask your health care provider. Monitor your blood pressure at home as told by your health care provider. Keep all follow-up visits. This is important. Medicines Take over-the-counter and prescription medicines only as told by your health care provider. Follow directions carefully. Blood pressure medicines must be taken as prescribed. Do not skip doses of blood pressure medicine. Doing this puts you at risk for problems and can make the medicine less effective. Ask your health care provider about side effects or reactions to medicines that you should watch for. Contact a health care provider if you: Think you are having a reaction to a medicine you are taking. Have headaches that keep coming back (recurring). Feel dizzy. Have swelling in your ankles. Have trouble with your vision. Get help right away if you: Develop a severe headache or confusion. Have unusual weakness or numbness. Feel faint. Have severe pain in your chest or abdomen. Vomit repeatedly. Have trouble breathing. These symptoms may be an emergency. Get help right away. Call 911. Do not wait to see if the symptoms will go away. Do not drive yourself to the hospital. Summary Hypertension is when the force of blood pumping through your arteries is too strong. If this condition  is not controlled, it may put you at risk for serious complications. Your personal target blood pressure may vary depending on your medical conditions, your age, and other factors. For most people, a normal blood pressure is less than 120/80. Hypertension is treated with lifestyle changes, medicines, or a combination of both. Lifestyle changes include losing weight, eating a healthy, low-sodium diet, exercising more, and limiting alcohol. This information is not intended to replace advice given to you by your health care provider. Make sure you discuss any questions you  have with your health care provider. Document Revised: 05/03/2021 Document Reviewed: 05/03/2021 Elsevier Patient Education  2024 [employer]     Edwina Barth, MD [facility]

## 2023-05-21 NOTE — Assessment & Plan Note (Signed)
Stable and well controlled. 

## 2023-05-21 NOTE — Assessment & Plan Note (Signed)
Diet and nutrition discussed Hemoglobin A1c done today Advised to continue decreasing amount of daily carbohydrate intake and daily calories and increase amount of plant-based protein in her diet

## 2023-05-21 NOTE — Assessment & Plan Note (Signed)
Clinically euthyroid. TSH done today Continues Armour Thyroid 15 mg daily

## 2023-07-03 ENCOUNTER — Other Ambulatory Visit: Payer: Self-pay | Admitting: Emergency Medicine

## 2023-07-10 ENCOUNTER — Telehealth: Payer: BC Managed Care – PPO | Admitting: Family Medicine

## 2023-07-10 ENCOUNTER — Ambulatory Visit: Payer: Self-pay | Admitting: Emergency Medicine

## 2023-07-10 ENCOUNTER — Encounter: Payer: Self-pay | Admitting: Emergency Medicine

## 2023-07-10 DIAGNOSIS — J019 Acute sinusitis, unspecified: Secondary | ICD-10-CM

## 2023-07-10 MED ORDER — AMOXICILLIN-POT CLAVULANATE 875-125 MG PO TABS: 1.0000 | ORAL_TABLET | Freq: Two times a day (BID) | ORAL | 0 refills | Status: AC

## 2023-07-10 NOTE — Patient Instructions (Signed)
 Natasha Lyons, thank you for joining Roosvelt Mater, PA-C for today's virtual visit.  While this provider is not your primary care provider (PCP), if your PCP is located in our provider database this encounter information will be shared with them immediately following your visit.   A [facility] MyChart account gives you access to today's visit and all your visits, tests, and labs performed at [facility]  click here if you don't have a [facility] MyChart account or go to [email or web address]  Consent: (Patient) Natasha Lyons provided verbal consent for this virtual visit at the beginning of the encounter.  Current Medications:  Current Outpatient Medications:    amoxicillin -clavulanate (AUGMENTIN ) 875-125 MG tablet, Take 1 tablet by mouth 2 (two) times daily for 7 days., Disp: 14 tablet, Rfl: 0   Acetaminophen  (TYLENOL  8 HOUR PO), Take by mouth., Disp: , Rfl:    albuterol  (PROVENTIL ) (2.5 MG/3ML) 0.083% nebulizer solution, Take 3 mLs (2.5 mg total) by nebulization every 6 (six) hours as needed for wheezing or shortness of breath., Disp: 75 mL, Rfl: 0   albuterol  (VENTOLIN  HFA) 108 (90 Base) MCG/ACT inhaler, Inhale 2 puffs into the lungs every 6 (six) hours as needed for wheezing., Disp: , Rfl:    amLODipine  (NORVASC ) 5 MG tablet, TAKE 1 TABLET (5 MG TOTAL) BY MOUTH DAILY., Disp: 90 tablet, Rfl: 1   ARMOUR THYROID  15 MG tablet, Take 15 mg by mouth daily., Disp: , Rfl:    BD PEN NEEDLE NANO 2ND GEN 32G X 4 MM MISC, USE ONCE A WEEK, Disp: 100 each, Rfl: 0   blood glucose meter kit and supplies KIT, Dispense based on patient and insurance preference. Use up to four times daily as directed., Disp: 1 each, Rfl: 0   buPROPion  (WELLBUTRIN  XL) 150 MG 24 hr tablet, Take 1 tablet (150 mg total) by mouth daily., Disp: 90 tablet, Rfl: 3   cetirizine (ZYRTEC) 10 MG tablet, Take 10 mg by mouth daily., Disp: , Rfl:    clobetasol  ointment (TEMOVATE ) 0.05 %, Apply 1  application topically 2 (two) times daily., Disp: , Rfl:    DULoxetine  (CYMBALTA ) 60 MG capsule, Take 1 capsule (60 mg total) by mouth daily., Disp: 90 capsule, Rfl: 3   Estradiol  10 MCG TABS vaginal tablet, Place 1 tablet (10 mcg total) vaginally 2 (two) times a week., Disp: 24 tablet, Rfl: 3   fluticasone  (FLONASE ) 50 MCG/ACT nasal spray, Place 1 spray into both nostrils daily., Disp: 11.1 mL, Rfl: 1   folic acid (FOLVITE) 800 MCG tablet, Take 800 mcg by mouth daily. , Disp: , Rfl:    methotrexate (RHEUMATREX) 2.5 MG tablet, Take 20 mg by mouth every Sunday. Caution:Chemotherapy. Protect from light., Disp: , Rfl:    montelukast  (SINGULAIR ) 10 MG tablet, TAKE 1 TABLET BY MOUTH EVERYDAY AT BEDTIME, Disp: 90 tablet, Rfl: 1   phentermine  (ADIPEX-P ) 37.5 MG tablet, 1 tablet before breakfast by mouth Once a day for 30 days, Disp: , Rfl:    Secukinumab 150 MG/ML SOSY, Inject 300 mg into the skin every 30 (thirty) days., Disp: , Rfl:    tiZANidine  (ZANAFLEX ) 2 MG tablet, Take 1 tablet (2 mg total) by mouth every 8 (eight) hours as needed for muscle spasms., Disp: 90 tablet, Rfl: 2   topiramate  (TOPAMAX ) 25 MG tablet, TAKE 1 TABLET BY MOUTH EVERY DAY for 90, Disp: , Rfl:    Vitamin D , Ergocalciferol , (DRISDOL ) 1.25 MG (50000 UNIT) CAPS capsule, Take 1 capsule (50,000 Units total)  by mouth every 7 (seven) days., Disp: 12 capsule, Rfl: 0   WIXELA INHUB 250-50 MCG/ACT AEPB, INHALE 1 PUFF INTO THE LUNGS IN THE MORNING AND AT BEDTIME., Disp: 60 each, Rfl: 7   Medications ordered in this encounter:  Meds ordered this encounter  Medications   amoxicillin -clavulanate (AUGMENTIN ) 875-125 MG tablet    Sig: Take 1 tablet by mouth 2 (two) times daily for 7 days.    Dispense:  14 tablet    Refill:  0     *If you need refills on other medications prior to your next appointment, please contact your pharmacy*  Follow-Up: Call back or seek an in-person evaluation if the symptoms worsen or if the condition fails to  improve as anticipated.  [facility] [phone]  Other Instructions Sinus Infection, Adult A sinus infection, also called sinusitis, is inflammation of your sinuses. Sinuses are hollow spaces in the bones around your face. Your sinuses are located: Around your eyes. In the middle of your forehead. Behind your nose. In your cheekbones. Mucus normally drains out of your sinuses. When your nasal tissues become inflamed or swollen, mucus can become trapped or blocked. This allows bacteria, viruses, and fungi to grow, which leads to infection. Most infections of the sinuses are caused by a virus. A sinus infection can develop quickly. It can last for up to 4 weeks (acute) or for more than 12 weeks (chronic). A sinus infection often develops after a cold. What are the causes? This condition is caused by anything that creates swelling in the sinuses or stops mucus from draining. This includes: Allergies. Asthma. Infection from bacteria or viruses. Deformities or blockages in your nose or sinuses. Abnormal growths in the nose (nasal polyps). Pollutants, such as chemicals or irritants in the air. Infection from fungi. This is rare. What increases the risk? You are more likely to develop this condition if you: Have a weak body defense system (immune system). Do a lot of swimming or diving. Overuse nasal sprays. Smoke. What are the signs or symptoms? The main symptoms of this condition are pain and a feeling of pressure around the affected sinuses. Other symptoms include: Stuffy nose or congestion that makes it difficult to breathe through your nose. Thick yellow or greenish drainage from your nose. Tenderness, swelling, and warmth over the affected sinuses. A cough that may get worse at night. Decreased sense of smell and taste. Extra mucus that collects in the throat or the back of the nose (postnasal drip) causing a sore throat or bad breath. Tiredness  (fatigue). Fever. How is this diagnosed? This condition is diagnosed based on: Your symptoms. Your medical history. A physical exam. Tests to find out if your condition is acute or chronic. This may include: Checking your nose for nasal polyps. Viewing your sinuses using a device that has a light (endoscope). Testing for allergies or bacteria. Imaging tests, such as an MRI or CT scan. In rare cases, a bone biopsy may be done to rule out more serious types of fungal sinus disease. How is this treated? Treatment for a sinus infection depends on the cause and whether your condition is chronic or acute. If caused by a virus, your symptoms should go away on their own within 10 days. You may be given medicines to relieve symptoms. They include: Medicines that shrink swollen nasal passages (decongestants). A spray that eases inflammation of the nostrils (topical intranasal corticosteroids). Rinses that help get rid of thick mucus in your nose (  nasal saline washes). Medicines that treat allergies (antihistamines). Over-the-counter pain relievers. If caused by bacteria, your health care provider may recommend waiting to see if your symptoms improve. Most bacterial infections will get better without antibiotic medicine. You may be given antibiotics if you have: A severe infection. A weak immune system. If caused by narrow nasal passages or nasal polyps, surgery may be needed. Follow these instructions at home: Medicines Take, use, or apply over-the-counter and prescription medicines only as told by your health care provider. These may include nasal sprays. If you were prescribed an antibiotic medicine, take it as told by your health care provider. Do not stop taking the antibiotic even if you start to feel better. Hydrate and humidify  Drink enough fluid to keep your urine pale yellow. Staying hydrated will help to thin your mucus. Use a cool mist humidifier to keep the humidity level in your  home above 50%. Inhale steam for 10-15 minutes, 3-4 times a day, or as told by your health care provider. You can do this in the bathroom while a hot shower is running. Limit your exposure to cool or dry air. Rest Rest as much as possible. Sleep with your head raised (elevated). Make sure you get enough sleep each night. General instructions  Apply a warm, moist washcloth to your face 3-4 times a day or as told by your health care provider. This will help with discomfort. Use nasal saline washes as often as told by your health care provider. Wash your hands often with soap and water to reduce your exposure to germs. If soap and water are not available, use hand sanitizer. Do not smoke. Avoid being around people who are smoking (secondhand smoke). Keep all follow-up visits. This is important. Contact a health care provider if: You have a fever. Your symptoms get worse. Your symptoms do not improve within 10 days. Get help right away if: You have a severe headache. You have persistent vomiting. You have severe pain or swelling around your face or eyes. You have vision problems. You develop confusion. Your neck is stiff. You have trouble breathing. These symptoms may be an emergency. Get help right away. Call 911. Do not wait to see if the symptoms will go away. Do not drive yourself to the hospital. Summary A sinus infection is soreness and inflammation of your sinuses. Sinuses are hollow spaces in the bones around your face. This condition is caused by nasal tissues that become inflamed or swollen. The swelling traps or blocks the flow of mucus. This allows bacteria, viruses, and fungi to grow, which leads to infection. If you were prescribed an antibiotic medicine, take it as told by your health care provider. Do not stop taking the antibiotic even if you start to feel better. Keep all follow-up visits. This is important. This information is not intended to replace advice given to  you by your health care provider. Make sure you discuss any questions you have with your health care provider. Document Revised: 05/31/2021 Document Reviewed: 05/31/2021 Elsevier Patient Education  2024 Elsevier Inc.    If you have been instructed to have an in-person evaluation today at a local Urgent Care facility, please use the link below. It will take you to a list of all of our available [facility], including address, phone Lyons and hours of operation. Please do not delay care.  [facility]  If you or a family member do not have a primary care provider, use the  link below to schedule a visit and establish care. When you choose a [facility] primary care physician or advanced practice provider, you gain a long-term partner in health. Find a Primary Care Provider  Learn more about [facility]'s in-office and virtual care options: [facility] - Get Care Now

## 2023-07-10 NOTE — Telephone Encounter (Signed)
  Chief Complaint: Sinus Pain Symptoms: Sinus pain and pressure, cough, headache, body aches, chills Frequency: 2 days Pertinent Negatives: Patient denies SOB, NVD, visual changes Disposition: [] ED /[] Urgent Care (no appt availability in office) / [x] Appointment(In office/virtual)/ []  [facility]/ [] Home Care/ [] Refused Recommended Disposition /[] [facility]/ []  Follow-up with PCP Additional Notes: Patient called stating she was treated for sinus infection 3-4 weeks ago. She states that symptoms mostly resolved with the exception of the cough. States yesterday she began feeling ill again, reports sinus pressure, headache, productive cough with yellow phlegm, fever (states 98.39F), chills and body aches. States she has been using tylenol  cold, neti pot, cough medication without relief. Denies SOB, visual changes. Per protocol, pt to be evaluated within 24 hours. Pt request virtual visit, scheduled for today at 1915. Care advice reviewed, pt verbalized understanding. Alerting provider for review.  opied from CRM [id number]. Topic: Clinical - Red Word Triage >> Jul 10, 2023  8:44 AM Gerardine PARAS wrote: Red Word that prompted transfer to Nurse Triage: Patient is experiencing severe sinus pain, was given z pack that helped until sinus pressure returned. Has a lot coughing, phlegm and body aches as well as on and off fever and headaches, was advised to get scheduled virtually Reason for Disposition  [1] Sinus pain (not just congestion) AND [2] fever  Answer Assessment - Initial Assessment Questions 1. LOCATION: Where does it hurt?      Middle of forehead, back of pressure 2. ONSET: When did the sinus pain start?  (e.g., hours, days)      Initially 3-4 weeks ago, took abx and improved, started feeling worse again yesterday 3. SEVERITY: How bad is the pain?   (Scale 1-10; mild, moderate or severe)   - MILD (1-3): doesn't interfere with normal activities    - MODERATE (4-7):  interferes with normal activities (e.g., work or school) or awakens from sleep   - SEVERE (8-10): excruciating pain and patient unable to do any normal activities        7/10 4. RECURRENT SYMPTOM: Have you ever had sinus problems before? If Yes, ask: When was the last time? and What happened that time?      Yes, 3 weeks 5. NASAL CONGESTION: Is the nose blocked? If Yes, ask: Can you open it or must you breathe through your mouth?     Congestion 6. NASAL DISCHARGE: Do you have discharge from your nose? If so ask, What color?     None at this time 7. FEVER: Do you have a fever? If Yes, ask: What is it, how was it measured, and when did it start?      States yes, yesterday but states it was 98.8 8. OTHER SYMPTOMS: Do you have any other symptoms? (e.g., sore throat, cough, earache, difficulty breathing)     Cough- productive yellow phlegm, fever, ear pressure on R side  Protocols used: Sinus Pain or Congestion-A-AH

## 2023-07-10 NOTE — Progress Notes (Signed)
 Virtual Visit Consent   Christian Treadway, you are scheduled for a virtual visit with a [facility] provider today. Just as with appointments in the office, your consent must be obtained to participate. Your consent will be active for this visit and any virtual visit you may have with one of our providers in the next 365 days. If you have a MyChart account, a copy of this consent can be sent to you electronically.  As this is a virtual visit, video technology does not allow for your provider to perform a traditional examination. This may limit your provider's ability to fully assess your condition. If your provider identifies any concerns that need to be evaluated in person or the need to arrange testing (such as labs, EKG, etc.), we will make arrangements to do so. Although advances in technology are sophisticated, we cannot ensure that it will always work on either your end or our end. If the connection with a video visit is poor, the visit may have to be switched to a telephone visit. With either a video or telephone visit, we are not always able to ensure that we have a secure connection.  By engaging in this virtual visit, you consent to the provision of healthcare and authorize for your insurance to be billed (if applicable) for the services provided during this visit. Depending on your insurance coverage, you may receive a charge related to this service.  I need to obtain your verbal consent now. Are you willing to proceed with your visit today? Tascha Vanissa Strength has provided verbal consent on 07/10/2023 for a virtual visit (video or telephone). Natasha Lyons, [place]  Date: 07/10/2023 7:23 PM  Virtual Visit via Video Note   I, Natasha Lyons, connected with  Natasha Lyons  ([id number], [birth year]) on 07/10/23 at  7:15 PM EST by a video-enabled telemedicine application and verified that I am speaking with the correct person using two identifiers.  Location: Patient: Virtual Visit  Location Patient: Home Provider: Virtual Visit Location Provider: Home Office   I discussed the limitations of evaluation and management by telemedicine and the availability of in person appointments. The patient expressed understanding and agreed to proceed.    History of Present Illness: Natasha Lyons is a 62 y.o. who identifies as a female who was assigned female at birth, and is being seen today for c/o struggling with a bad sinus issue.  Pt states 3-4 weeks ago she was prescribed a z-pack and thought she was getting better but has now feeling worse.  Pt states taking Tylenol , saline solution.  Pt states she has sinus pressure and cough and right ear pain. Pt states a light brown phlegm and very congested nose. Pt states using Flonase  and netty pot.   HPI: HPI  Problems:  Patient Active Problem List   Diagnosis Date Noted   Seasonal allergies 01/04/2022   Primary osteoarthritis involving multiple joints 01/04/2022   Chronic idiopathic constipation 09/21/2021   Gastro-esophageal reflux disease without esophagitis 09/21/2021   Cervical spondylosis without myelopathy 03/07/2021   Joint pain 03/07/2021   Osteoarthritis 03/07/2021   Fibromyalgia 03/07/2021   H/O total knee replacement, 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   Depression 04/05/2020   Other specified anxiety disorders 04/05/2020   Chronic pain of right knee 04/05/2020   Prediabetes 02/09/2020   History of endometriosis 02/06/2020   Osteoporosis 01/06/2020   Nonallopathic lesion of cervical region 04/24/2016   Nonallopathic lesion of rib cage 04/24/2016   SI (sacroiliac) joint dysfunction 03/21/2016  Nonallopathic lesion of sacral region 03/21/2016   Nonallopathic lesion of lumbosacral region 03/21/2016   Nonallopathic lesion of thoracic region 03/21/2016   Rheumatoid bursitis, 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/2015   Psoriatic arthritis (HCC) 04/07/2015   Chronic pain syndrome 04/07/2015   Psoriasis 08/10/2013   Vitamin D  deficiency  08/10/2013   Thyroid  nodule 05/02/2013   Essential hypertension 03/06/2013   Acquired hypothyroidism 03/06/2013   Extrinsic asthma, mild, intermittent 03/06/2013   Class 2 severe obesity with serious comorbidity and body mass index (BMI) of 36.0 to 36.9 in adult [facility]) 03/06/2013    Allergies:  Allergies  Allergen Reactions   Dust Mite Extract Shortness Of Breath   Grass Extracts [Gramineae Pollens] Shortness Of Breath   Tree Extract Shortness Of Breath    Certain tree allergies   Medications:  Current Outpatient Medications:    amoxicillin -clavulanate (AUGMENTIN ) 875-125 MG tablet, Take 1 tablet by mouth 2 (two) times daily for 7 days., Disp: 14 tablet, Rfl: 0   Acetaminophen  (TYLENOL  8 HOUR PO), Take by mouth., Disp: , Rfl:    albuterol  (PROVENTIL ) (2.5 MG/3ML) 0.083% nebulizer solution, Take 3 mLs (2.5 mg total) by nebulization every 6 (six) hours as needed for wheezing or shortness of breath., Disp: 75 mL, Rfl: 0   albuterol  (VENTOLIN  HFA) 108 (90 Base) MCG/ACT inhaler, Inhale 2 puffs into the lungs every 6 (six) hours as needed for wheezing., Disp: , Rfl:    amLODipine  (NORVASC ) 5 MG tablet, TAKE 1 TABLET (5 MG TOTAL) BY MOUTH DAILY., Disp: 90 tablet, Rfl: 1   ARMOUR THYROID  15 MG tablet, Take 15 mg by mouth daily., Disp: , Rfl:    BD PEN NEEDLE NANO 2ND GEN 32G X 4 MM MISC, USE ONCE A WEEK, Disp: 100 each, Rfl: 0   blood glucose meter kit and supplies KIT, Dispense based on patient and insurance preference. Use up to four times daily as directed., Disp: 1 each, Rfl: 0   buPROPion  (WELLBUTRIN  XL) 150 MG 24 hr tablet, Take 1 tablet (150 mg total) by mouth daily., Disp: 90 tablet, Rfl: 3   cetirizine (ZYRTEC) 10 MG tablet, Take 10 mg by mouth daily., Disp: , Rfl:    clobetasol  ointment (TEMOVATE ) 0.05 %, Apply 1 application topically 2 (two) times daily., Disp: , Rfl:    DULoxetine  (CYMBALTA ) 60 MG capsule, Take 1 capsule (60 mg total) by mouth daily., Disp: 90 capsule, Rfl: 3    Estradiol  10 MCG TABS vaginal tablet, Place 1 tablet (10 mcg total) vaginally 2 (two) times a week., Disp: 24 tablet, Rfl: 3   fluticasone  (FLONASE ) 50 MCG/ACT nasal spray, Place 1 spray into both nostrils daily., Disp: 11.1 mL, Rfl: 1   folic acid (FOLVITE) 800 MCG tablet, Take 800 mcg by mouth daily. , Disp: , Rfl:    methotrexate (RHEUMATREX) 2.5 MG tablet, Take 20 mg by mouth every Sunday. Caution:Chemotherapy. Protect from light., Disp: , Rfl:    montelukast  (SINGULAIR ) 10 MG tablet, TAKE 1 TABLET BY MOUTH EVERYDAY AT BEDTIME, Disp: 90 tablet, Rfl: 1   phentermine  (ADIPEX-P ) 37.5 MG tablet, 1 tablet before breakfast by mouth Once a day for 30 days, Disp: , Rfl:    Secukinumab 150 MG/ML SOSY, Inject 300 mg into the skin every 30 (thirty) days., Disp: , Rfl:    tiZANidine  (ZANAFLEX ) 2 MG tablet, Take 1 tablet (2 mg total) by mouth every 8 (eight) hours as needed for muscle spasms., Disp: 90 tablet, Rfl: 2   topiramate  (TOPAMAX ) 25 MG tablet,  TAKE 1 TABLET BY MOUTH EVERY DAY for 90, Disp: , Rfl:    Vitamin D , Ergocalciferol , (DRISDOL ) 1.25 MG (50000 UNIT) CAPS capsule, Take 1 capsule (50,000 Units total) by mouth every 7 (seven) days., Disp: 12 capsule, Rfl: 0   WIXELA INHUB 250-50 MCG/ACT AEPB, INHALE 1 PUFF INTO THE LUNGS IN THE MORNING AND AT BEDTIME., Disp: 60 each, Rfl: 7  Observations/Objective: Patient is well-developed, well-nourished in no acute distress.  Resting comfortably at home.  Head is normocephalic, atraumatic.  No labored breathing.  Speech is clear and coherent with logical content.  Patient is alert and oriented at baseline.    Assessment and Plan: 1. Acute sinusitis, recurrence not specified, unspecified location (Primary) - amoxicillin -clavulanate (AUGMENTIN ) 875-125 MG tablet; Take 1 tablet by mouth 2 (two) times daily for 7 days.  Dispense: 14 tablet; Refill: 0  -Pt declined starting steroids and she states she does not do well with them  -Advised patient if  worsening symptoms to follow up in person with urgent care or PCP  Follow Up Instructions: I discussed the assessment and treatment plan with the patient. The patient was provided an opportunity to ask questions and all were answered. The patient agreed with the plan and demonstrated an understanding of the instructions.  A copy of instructions were sent to the patient via MyChart unless otherwise noted below.    The patient was advised to call back or seek an in-person evaluation if the symptoms worsen or if the condition fails to improve as anticipated.    Natasha Mater, PA-C

## 2023-07-31 ENCOUNTER — Other Ambulatory Visit: Payer: Self-pay | Admitting: Emergency Medicine

## 2023-08-07 ENCOUNTER — Other Ambulatory Visit: Payer: Self-pay | Admitting: Physical Medicine & Rehabilitation

## 2023-08-17 LAB — HM DIABETES EYE EXAM

## 2023-10-03 ENCOUNTER — Encounter: Payer: Self-pay | Admitting: Emergency Medicine

## 2023-10-15 ENCOUNTER — Encounter: Payer: Self-pay | Admitting: Emergency Medicine

## 2023-10-16 ENCOUNTER — Encounter: Payer: Self-pay | Admitting: Emergency Medicine

## 2023-10-16 ENCOUNTER — Ambulatory Visit (INDEPENDENT_AMBULATORY_CARE_PROVIDER_SITE_OTHER): Admitting: Emergency Medicine

## 2023-10-16 VITALS — BP 138/78 | HR 90 | Temp 98.4°F | Ht 63.5 in | Wt 200.0 lb

## 2023-10-16 DIAGNOSIS — J329 Chronic sinusitis, unspecified: Secondary | ICD-10-CM | POA: Insufficient documentation

## 2023-10-16 NOTE — Progress Notes (Signed)
 Natasha Lyons 63 y.o.   Chief Complaint  Patient presents with   Ear Pain    Patient states this has happened before last year. Issues with both ears. She did she ENT last year but they said there was nothing there. She is having a sharpe pain moving down her neck. She also mentions that her taste has changed     HISTORY OF PRESENT ILLNESS: This is a 63 y.o. female with history of chronic sinusitis complaining of chronic sinus congestions for the last couple months. At times she has pain to both fevers radiating down her neck.  No other associated symptoms. No other complaints or medical concerns today.  HPI   Prior to Admission medications   Medication Sig Start Date End Date Taking? Authorizing Provider  Acetaminophen (TYLENOL 8 HOUR PO) Take by mouth.   Yes [provider]  albuterol (PROVENTIL) (2.5 MG/3ML) 0.083% nebulizer solution Take 3 mLs (2.5 mg total) by nebulization every 6 (six) hours as needed for wheezing or shortness of breath. 02/01/22  Yes Tiye Huwe, Eilleen Kempf, MD  albuterol (VENTOLIN HFA) 108 (90 Base) MCG/ACT inhaler Inhale 2 puffs into the lungs every 6 (six) hours as needed for wheezing.   Yes [provider]  amLODipine (NORVASC) 5 MG tablet TAKE 1 TABLET (5 MG TOTAL) BY MOUTH DAILY. 04/27/23  Yes SagardiaEilleen Kempf, MD  ARMOUR THYROID 15 MG tablet Take 15 mg by mouth daily. 10/28/19  Yes [provider]  BD PEN NEEDLE NANO 2ND GEN 32G X 4 MM MISC USE ONCE A WEEK 04/26/20  Yes Helane Rima, DO  blood glucose meter kit and supplies KIT Dispense based on patient and insurance preference. Use up to four times daily as directed. 03/16/21  Yes Whitmire, Dawn, FNP  buPROPion (WELLBUTRIN XL) 150 MG 24 hr tablet Take 1 tablet (150 mg total) by mouth daily. 08/08/22  Yes Genine Beckett, Eilleen Kempf, MD  cetirizine (ZYRTEC) 10 MG tablet Take 10 mg by mouth daily.   Yes [provider]  clobetasol ointment (TEMOVATE) 0.05 % Apply 1  application topically 2 (two) times daily. 08/11/19  Yes [provider]  DULoxetine (CYMBALTA) 60 MG capsule Take 1 capsule (60 mg total) by mouth daily. 05/21/23  Yes Fanny Dance, MD  Estradiol 10 MCG TABS vaginal tablet Place 1 tablet (10 mcg total) vaginally 2 (two) times a week. 04/19/23  Yes Chrzanowski, Jami B, NP  fluticasone (FLONASE) 50 MCG/ACT nasal spray SPRAY 1 SPRAY INTO BOTH NOSTRILS DAILY. 07/31/23  Yes Lino Wickliff, Eilleen Kempf, MD  folic acid (FOLVITE) 800 MCG tablet Take 800 mcg by mouth daily.    Yes [provider]  methotrexate (RHEUMATREX) 2.5 MG tablet Take 20 mg by mouth every Sunday. Caution:Chemotherapy. Protect from light.   Yes [provider]  montelukast (SINGULAIR) 10 MG tablet TAKE 1 TABLET BY MOUTH EVERYDAY AT BEDTIME 07/03/23  Yes Jessalynn Mccowan, Eilleen Kempf, MD  phentermine (ADIPEX-P) 37.5 MG tablet 1 tablet before breakfast by mouth Once a day for 30 days 08/10/22  Yes [provider]  Secukinumab 150 MG/ML SOSY Inject 300 mg into the skin every 30 (thirty) days.   Yes [provider]  tiZANidine (ZANAFLEX) 2 MG tablet TAKE 1 TABLET BY MOUTH EVERY 8 HOURS AS NEEDED FOR MUSCLE SPASMS. 08/07/23  Yes Fanny Dance, MD  topiramate (TOPAMAX) 25 MG tablet TAKE 1 TABLET BY MOUTH EVERY DAY for 90   Yes [provider]  Vitamin D, Ergocalciferol, (DRISDOL) 1.25 MG (50000  UNIT) CAPS capsule Take 1 capsule (50,000 Units total) by mouth every 7 (seven) days. 09/08/21  Yes Alois Cliche, PA-C  WIXELA INHUB 250-50 MCG/ACT AEPB INHALE 1 PUFF INTO THE LUNGS IN THE MORNING AND AT BEDTIME. 03/31/23  Yes Anira Senegal, Eilleen Kempf, MD    Allergies  Allergen Reactions   Dust Mite Extract Shortness Of Breath   Grass Extracts [Gramineae Pollens] Shortness Of Breath   Tree Extract Shortness Of Breath    Certain tree allergies    Patient Active Problem List   Diagnosis Date Noted   Seasonal allergies 01/04/2022   Primary osteoarthritis  involving multiple joints 01/04/2022   Chronic idiopathic constipation 09/21/2021   Gastro-esophageal reflux disease without esophagitis 09/21/2021   Cervical spondylosis without myelopathy 03/07/2021   Joint pain 03/07/2021   Osteoarthritis 03/07/2021   Fibromyalgia 03/07/2021   H/O total knee replacement, 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   Depression 04/05/2020   Other specified anxiety disorders 04/05/2020   Chronic pain of right knee 04/05/2020   Prediabetes 02/09/2020   History of endometriosis 02/06/2020   Osteoporosis 01/06/2020   Nonallopathic lesion of cervical region 04/24/2016   Nonallopathic lesion of rib cage 04/24/2016   SI (sacroiliac) joint dysfunction 03/21/2016   Nonallopathic lesion of sacral region 03/21/2016   Nonallopathic lesion of lumbosacral region 03/21/2016   Nonallopathic lesion of thoracic region 03/21/2016   Rheumatoid bursitis, 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/2015   Psoriatic arthritis (HCC) 04/07/2015   Chronic pain syndrome 04/07/2015   Psoriasis 08/10/2013   Vitamin D deficiency 08/10/2013   Thyroid nodule 05/02/2013   Essential hypertension 03/06/2013   Acquired hypothyroidism 03/06/2013   Extrinsic asthma, mild, intermittent 03/06/2013   Class 2 severe obesity with serious comorbidity and body mass index (BMI) of 36.0 to 36.9 in adult [facility]) 03/06/2013    Past Medical History:  Diagnosis Date   Allergy    takes Zyrtec daily   Anemia    Anxiety    Arthritis    psorriatric arthritis   Asthma    Albuterol as needed as well as neb as needed   Back pain    Bilateral swelling of feet    Chronic back pain    pinched nerve   Constipation    Cough    Depression    Family history of adverse reaction to anesthesia    pts dad gets sick with anesthesia   Fibromyalgia    takes Effexor daily   Fibromyalgia    Gallbladder problem    GERD (gastroesophageal reflux disease)    Headache    seasonal    History of bronchitis 2016   History of shingles     Hypertension    takes Amlodipine daily   Hypothyroid    Infertility, female    Joint pain    Joint pain    Joint swelling    Muscle spasm    takes Robaxin as needed   Osteoarthritis    Osteoarthritis    Palpitations    Pneumonia    hx of-10+ yrs ago   Prediabetes    Psoriasis    Psoriatic arthritis (HCC)    Shortness of breath    Vitamin D deficiency     Past Surgical History:  Procedure Laterality Date   APPENDECTOMY     BIOPSY THYROID  2014   results benign   CHOLECYSTECTOMY  2008   DIAGNOSTIC LAPAROSCOPY     d/c endometriosis   DILATION AND CURETTAGE OF UTERUS     INSERTION OF MESH N/A 06/15/2016  Procedure: INSERTION OF MESH;  Surgeon: Chevis Pretty III, MD;  Location: [facility] OR;  Service: General;  Laterality: N/A;   KNEE ARTHROSCOPY Bilateral 2011   MASS EXCISION Right 04/21/2014   Procedure: EXCISION OF RIGHT BACK MASS;  Surgeon: Abigail Miyamoto, MD;  Location: [facility];  Service: General;  Laterality: Right;   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   REPLACEMENT TOTAL KNEE Left    05/2022   TONSILLECTOMY     VENTRAL HERNIA REPAIR  06/15/2016   VENTRAL HERNIA REPAIR N/A 06/15/2016   Procedure: LAPAROSCOPIC VENTRAL HERNIA WITH MESH;  Surgeon: Chevis Pretty III, MD;  Location: [facility] OR;  Service: General;  Laterality: N/A;    Social History   Socioeconomic History   Marital status: Married    Spouse name: Not on file   Number of children: 1   Years of education: Not on file   Highest education level: Not on file  Occupational History   Not on file  Tobacco Use   Smoking status: Never    Passive exposure: Past   Smokeless tobacco: Never  Vaping Use   Vaping status: Never Used  Substance and Sexual Activity   Alcohol use: Yes    Comment: occ   Drug use: No   Sexual activity: Not Currently    Partners: Male    Birth control/protection: Post-menopausal    Comment: menarche 63yo, sexual debut 63yo  Other Topics Concern   Not on file  Social  History Narrative   Not on file   Social Drivers of Health   Financial Resource Strain: Not on file  Food Insecurity: Not on file  Transportation Needs: Not on file  Physical Activity: Not on file  Stress: Not on file  Social Connections: Not on file  Intimate Partner Violence: Not on file    Family History  Problem Relation Age of Onset   Breast cancer Mother    Diabetes Mother    Hypertension Mother    Hyperlipidemia Mother    Heart disease Mother    Stroke Mother    Kidney disease Mother    Thyroid disease Mother    Obesity Mother    Hypertension Father      Review of Systems  Constitutional: Negative.   HENT:  Positive for congestion. Negative for sore throat.   Respiratory: Negative.  Negative for cough and shortness of breath.   Cardiovascular:  Negative for chest pain and palpitations.  Gastrointestinal:  Negative for abdominal pain, diarrhea, nausea and vomiting.  Genitourinary: Negative.  Negative for dysuria and hematuria.  Skin: Negative.  Negative for rash.  Neurological: Negative.  Negative for dizziness and headaches.  All other systems reviewed and are negative.   Vitals:   10/16/23 1544  BP: 138/78  Pulse: 90  Temp: 98.4 F (36.9 C)  SpO2: 96%    Physical Exam Vitals reviewed.  Constitutional:      Appearance: Normal appearance.  HENT:     Head: Normocephalic.     Right Ear: Tympanic membrane, ear canal and external ear normal.     Left Ear: Tympanic membrane, ear canal and external ear normal.     Mouth/Throat:     Mouth: Mucous membranes are moist.     Pharynx: Oropharynx is clear.  Eyes:     Extraocular Movements: Extraocular movements intact.     Conjunctiva/sclera: Conjunctivae normal.     Pupils: Pupils are equal, round, and reactive to light.  Neck:     Vascular: No  carotid bruit.  Cardiovascular:     Rate and Rhythm: Normal rate and regular rhythm.     Pulses: Normal pulses.     Heart sounds: Normal heart sounds.   Pulmonary:     Effort: Pulmonary effort is normal.     Breath sounds: Normal breath sounds.  Musculoskeletal:     Cervical back: No tenderness.  Lymphadenopathy:     Cervical: No cervical adenopathy.  Skin:    General: Skin is warm and dry.     Capillary Refill: Capillary refill takes less than 2 seconds.  Neurological:     General: No focal deficit present.     Mental Status: She is alert and oriented to person, place, and time.  Psychiatric:        Mood and Affect: Mood normal.        Behavior: Behavior normal.      ASSESSMENT & PLAN: A total of 32 minutes was spent with the patient and counseling/coordination of care regarding preparing for this visit, review of most recent office visit notes, diagnosis of chronic sinusitis and need for ENT evaluation, need for diagnostic imaging with CT of sinuses, symptom management, prognosis, documentation and need for follow-up.  Problem List Items Addressed This Visit       Respiratory   Chronic sinusitis - Primary   Chronic and affecting quality of life Very symptomatic Has seen ENT in the past.  No significant findings. Recommend CT scan of sinuses with ENT follow-up.      Relevant Orders   CT SINUS WO CONTRAST   Chronic congestion of paranasal sinus   Symptom regimen discussed Recommend daily frequent saline nasal sprays Recommend CT scan of sinuses and follow-up with ENT      Patient Instructions  Sinus Infection, Adult A sinus infection is soreness and swelling (inflammation) of your sinuses. Sinuses are hollow spaces in the bones around your face. They are located: Around your eyes. In the middle of your forehead. Behind your nose. In your cheekbones. Your sinuses and nasal passages are lined with a fluid called mucus. Mucus drains out of your sinuses. Swelling can trap mucus in your sinuses. This lets germs (bacteria, virus, or fungus) grow, which leads to infection. Most of the time, this condition is caused by a  virus. What are the causes? Allergies. Asthma. Germs. Things that block your nose or sinuses. Growths in the nose (nasal polyps). Chemicals or irritants in the air. A fungus. This is rare. What increases the risk? Having a weak body defense system (immune system). Doing a lot of swimming or diving. Using nasal sprays too much. Smoking. What are the signs or symptoms? The main symptoms of this condition are pain and a feeling of pressure around the sinuses. Other symptoms include: Stuffy nose (congestion). This may make it hard to breathe through your nose. Runny nose (drainage). Soreness, swelling, and warmth in the sinuses. A cough that may get worse at night. Being unable to smell and taste. Mucus that collects in the throat or the back of the nose (postnasal drip). This may cause a sore throat or bad breath. Being very tired (fatigued). A fever. How is this diagnosed? Your symptoms. Your medical history. A physical exam. Tests to find out if your condition is short-term (acute) or long-term (chronic). Your doctor may: Check your nose for growths (polyps). Check your sinuses using a tool that has a light on one end (endoscope). Check for allergies or germs. Do imaging tests, such as an  MRI or CT scan. How is this treated? Treatment for this condition depends on the cause and whether it is short-term or long-term. If caused by a virus, your symptoms should go away on their own within 10 days. You may be given medicines to relieve symptoms. They include: Medicines that shrink swollen tissue in the nose. A spray that treats swelling of the nostrils. Rinses that help get rid of thick mucus in your nose (nasal saline washes). Medicines that treat allergies (antihistamines). Over-the-counter pain relievers. If caused by bacteria, your doctor may wait to see if you will get better without treatment. You may be given antibiotic medicine if you have: A very bad infection. A weak  body defense system. If caused by growths in the nose, surgery may be needed. Follow these instructions at home: Medicines Take, use, or apply over-the-counter and prescription medicines only as told by your doctor. These may include nasal sprays. If you were prescribed an antibiotic medicine, take it as told by your doctor. Do not stop taking it even if you start to feel better. Hydrate and humidify  Drink enough water to keep your pee (urine) pale yellow. Use a cool mist humidifier to keep the humidity level in your home above 50%. Breathe in steam for 10-15 minutes, 3-4 times a day, or as told by your doctor. You can do this in the bathroom while a hot shower is running. Try not to spend time in cool or dry air. Rest Rest as much as you can. Sleep with your head raised (elevated). Make sure you get enough sleep each night. General instructions  Put a warm, moist washcloth on your face 3-4 times a day, or as often as told by your doctor. Use nasal saline washes as often as told by your doctor. Wash your hands often with soap and water. If you cannot use soap and water, use hand sanitizer. Do not smoke. Avoid being around people who are smoking (secondhand smoke). Keep all follow-up visits. Contact a doctor if: You have a fever. Your symptoms get worse. Your symptoms do not get better within 10 days. Get help right away if: You have a very bad headache. You cannot stop vomiting. You have very bad pain or swelling around your face or eyes. You have trouble seeing. You feel confused. Your neck is stiff. You have trouble breathing. These symptoms may be an emergency. Get help right away. Call 911. Do not wait to see if the symptoms will go away. Do not drive yourself to the hospital. Summary A sinus infection is swelling of your sinuses. Sinuses are hollow spaces in the bones around your face. This condition is caused by tissues in your nose that become inflamed or swollen. This  traps germs. These can lead to infection. If you were prescribed an antibiotic medicine, take it as told by your doctor. Do not stop taking it even if you start to feel better. Keep all follow-up visits. This information is not intended to replace advice given to you by your health care provider. Make sure you discuss any questions you have with your health care provider. Document Revised: 05/31/2021 Document Reviewed: 05/31/2021 Elsevier Patient Education  2024 Elsevier Inc.     Edwina Barth, MD [facility]

## 2023-10-16 NOTE — Patient Instructions (Signed)

## 2023-10-16 NOTE — Assessment & Plan Note (Signed)
 Symptom regimen discussed Recommend daily frequent saline nasal sprays Recommend CT scan of sinuses and follow-up with ENT

## 2023-10-16 NOTE — Assessment & Plan Note (Signed)
 Chronic and affecting quality of life Very symptomatic Has seen ENT in the past.  No significant findings. Recommend CT scan of sinuses with ENT follow-up.

## 2023-10-25 ENCOUNTER — Ambulatory Visit
Admission: RE | Admit: 2023-10-25 | Discharge: 2023-10-25 | Disposition: A | Discharge status: Home / Self Care | Source: Ambulatory Visit | Attending: Emergency Medicine | Admitting: Emergency Medicine

## 2023-10-25 DIAGNOSIS — J329 Chronic sinusitis, unspecified: Secondary | ICD-10-CM

## 2023-10-29 ENCOUNTER — Encounter: Payer: Self-pay | Admitting: Emergency Medicine

## 2023-10-31 NOTE — Telephone Encounter (Signed)
 Copied from CRM [id number]. Topic: Clinical - Lab/Test Results >> Oct 31, 2023  7:55 AM Kita Perish H wrote: Reason for CRM: Patient is calling following up on message sent to provider regarding her CT results, please reach out to patient, thanks.  Miachel Adams [phone]

## 2023-11-19 ENCOUNTER — Encounter: Payer: Self-pay | Admitting: Emergency Medicine

## 2023-11-21 ENCOUNTER — Other Ambulatory Visit: Payer: Self-pay | Admitting: Emergency Medicine

## 2023-11-23 ENCOUNTER — Encounter: Payer: BC Managed Care – PPO | Admitting: Physical Medicine & Rehabilitation

## 2023-11-26 MED ORDER — ALBUTEROL SULFATE (2.5 MG/3ML) 0.083% IN NEBU: 2.5000 mg | INHALATION_SOLUTION | Freq: Four times a day (QID) | RESPIRATORY_TRACT | 0 refills | Status: AC | PRN

## 2023-11-26 MED ORDER — ALBUTEROL SULFATE HFA 108 (90 BASE) MCG/ACT IN AERS: 2.0000 | INHALATION_SPRAY | Freq: Four times a day (QID) | RESPIRATORY_TRACT | 1 refills | Status: DC | PRN

## 2023-11-26 MED ORDER — CLOBETASOL PROPIONATE 0.05 % EX OINT: TOPICAL_OINTMENT | CUTANEOUS | 1 refills | Status: DC

## 2023-11-29 ENCOUNTER — Ambulatory Visit: Payer: BC Managed Care – PPO | Admitting: Emergency Medicine

## 2023-12-13 ENCOUNTER — Encounter (INDEPENDENT_AMBULATORY_CARE_PROVIDER_SITE_OTHER): Payer: Self-pay | Admitting: Otolaryngology

## 2023-12-13 ENCOUNTER — Ambulatory Visit (INDEPENDENT_AMBULATORY_CARE_PROVIDER_SITE_OTHER): Payer: Self-pay | Admitting: Otolaryngology

## 2023-12-13 VITALS — BP 145/84 | HR 76 | Ht 64.0 in | Wt 201.0 lb

## 2023-12-13 DIAGNOSIS — J310 Chronic rhinitis: Secondary | ICD-10-CM | POA: Diagnosis not present

## 2023-12-13 DIAGNOSIS — H6980 Other specified disorders of Eustachian tube, unspecified ear: Secondary | ICD-10-CM | POA: Diagnosis not present

## 2023-12-13 DIAGNOSIS — H6121 Impacted cerumen, right ear: Secondary | ICD-10-CM

## 2023-12-13 DIAGNOSIS — H6983 Other specified disorders of Eustachian tube, bilateral: Secondary | ICD-10-CM

## 2023-12-13 DIAGNOSIS — H9203 Otalgia, bilateral: Secondary | ICD-10-CM | POA: Diagnosis not present

## 2023-12-13 DIAGNOSIS — R0981 Nasal congestion: Secondary | ICD-10-CM | POA: Diagnosis not present

## 2023-12-16 DIAGNOSIS — H6983 Other specified disorders of Eustachian tube, bilateral: Secondary | ICD-10-CM | POA: Insufficient documentation

## 2023-12-16 DIAGNOSIS — H6121 Impacted cerumen, right ear: Secondary | ICD-10-CM | POA: Insufficient documentation

## 2023-12-16 NOTE — Progress Notes (Signed)
 Patient ID: Natasha Lyons, female   DOB: October 25, 1960, 63 y.o.   MRN: [id number]  CC: Bilateral ear pain, facial pain  HPI: The patient is a 63 year old female who presents today complaining of bilateral ear pain for the past year.  The patient was last seen in 2023.  At that time, she was noted to have referred otalgia and eustachian tube dysfunction.  She was treated with Flonase , Zyrtec, and Valsalva exercise.  According to the patient, she has noted increasing bilateral otalgia over the past few months.  The pain is worse with eating and chewing.  She has also noted occasional facial pain.  Her sinus CT scan in April 2025 was negative for any sinusitis.  Nasal septal deviation was noted.  She denies any otorrhea, vertigo, or recent change in her hearing.  The patient has a history of psoriatic arthritis and osteoarthritis.  She is currently on methotrexate.  Exam: General: Communicates without difficulty, well nourished, no acute distress. Head: Normocephalic, no evidence injury, no tenderness, facial buttresses intact without stepoff. Face/sinus: No tenderness to palpation and percussion. Facial movement is normal and symmetric. Eyes: PERRL, EOMI. No scleral icterus, conjunctivae clear. Neuro: CN II exam reveals vision grossly intact.  No nystagmus at any point of gaze. Ears: Auricles well formed without lesions.  Right ear cerumen impaction.  Bilateral TMJ crepitus.  Nose: External evaluation reveals normal support and skin without lesions.  Dorsum is intact.  Anterior rhinoscopy reveals congested mucosa over anterior aspect of inferior turbinates and intact septum.  No purulence noted. Oral:  Oral cavity and oropharynx are intact, symmetric, without erythema or edema.  Mucosa is moist without lesions. Neck: Full range of motion without pain.  There is no significant lymphadenopathy.  No masses palpable.  Thyroid  bed within normal limits to palpation.  Parotid glands and submandibular glands equal  bilaterally without mass.  Trachea is midline. Neuro:  CN 2-12 grossly intact.   Procedure: Right ear cerumen disimpaction Anesthesia: None Description: Under the operating microscope, the cerumen is carefully removed with a combination of cerumen currette, alligator forceps, and suction catheters.  After the cerumen is removed, the TMs are noted to be normal.  No mass, erythema, or lesions. The patient tolerated the procedure well.    Assessment:  1.  Right ear cerumen impaction.  After the cerumen disimpaction procedure, both tympanic membranes and middle ear spaces are noted to be normal.  No middle ear effusion or infection is noted. 2.  Bilateral referred otalgia.  This is likely secondary to musculoskeletal causes, such as TMJ dysfunction. 3.  Chronic rhinitis with nasal mucosal congestion and eustachian tube dysfunction.  Plan: 1.  Otomicroscopy with right ear cerumen disimpaction. 2.  The physical exam findings are reviewed with the patient. 3.  The patient is reassured that no infection is noted today. 4.  NSAIDs as needed to treat the referred otalgia. 5.  The patient is encouraged to use a mouthguard. 6.  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

## 2023-12-21 ENCOUNTER — Encounter: Payer: Self-pay | Admitting: Emergency Medicine

## 2023-12-22 ENCOUNTER — Other Ambulatory Visit: Payer: Self-pay | Admitting: Emergency Medicine

## 2023-12-24 ENCOUNTER — Other Ambulatory Visit: Payer: Self-pay | Admitting: Radiology

## 2023-12-24 DIAGNOSIS — E039 Hypothyroidism, unspecified: Secondary | ICD-10-CM

## 2023-12-24 MED ORDER — ARMOUR THYROID 15 MG PO TABS: 15.0000 mg | ORAL_TABLET | Freq: Every day | ORAL | 3 refills | Status: AC

## 2023-12-24 NOTE — Telephone Encounter (Signed)
 Okay to refill.  Thanks.

## 2023-12-25 ENCOUNTER — Other Ambulatory Visit: Payer: Self-pay | Admitting: Emergency Medicine

## 2024-02-04 ENCOUNTER — Other Ambulatory Visit: Payer: Self-pay | Admitting: Emergency Medicine

## 2024-04-11 ENCOUNTER — Encounter: Attending: Physical Medicine & Rehabilitation | Admitting: Physical Medicine & Rehabilitation

## 2024-04-11 ENCOUNTER — Encounter: Payer: Self-pay | Admitting: Physical Medicine & Rehabilitation

## 2024-04-11 VITALS — BP 135/85 | HR 71 | Ht 64.0 in | Wt 198.4 lb

## 2024-04-11 DIAGNOSIS — M25551 Pain in right hip: Secondary | ICD-10-CM | POA: Insufficient documentation

## 2024-04-11 DIAGNOSIS — M79672 Pain in left foot: Secondary | ICD-10-CM | POA: Diagnosis not present

## 2024-04-11 DIAGNOSIS — G8929 Other chronic pain: Secondary | ICD-10-CM | POA: Insufficient documentation

## 2024-04-11 DIAGNOSIS — R52 Pain, unspecified: Secondary | ICD-10-CM | POA: Insufficient documentation

## 2024-04-11 DIAGNOSIS — M797 Fibromyalgia: Secondary | ICD-10-CM | POA: Diagnosis not present

## 2024-04-11 DIAGNOSIS — M545 Low back pain, unspecified: Secondary | ICD-10-CM | POA: Insufficient documentation

## 2024-04-11 MED ORDER — SUZETRIGINE 50 MG PO TABS: 50.0000 mg | ORAL_TABLET | Freq: Two times a day (BID) | ORAL | 0 refills | Status: AC | PRN

## 2024-04-11 NOTE — Progress Notes (Addendum)
 Subjective:    Patient ID: Natasha Lyons Number, female    DOB: 1961-03-12, 63 y.o.   MRN: [id number]  HPI   Natasha Lyons is a 63 y.o. year old female  who  has a past medical history of Allergy, Anemia, Anxiety, Arthritis, Asthma, Back pain, Bilateral swelling of feet, Chronic back pain, Constipation, Cough, Depression, Family history of adverse reaction to anesthesia, Fibromyalgia, Fibromyalgia, Gallbladder problem, GERD (gastroesophageal reflux disease), Headache, History of bronchitis (2016), History of shingles, Hypertension, Hypothyroid, Infertility, female, Joint pain, Joint pain, Joint swelling, Muscle spasm, Osteoarthritis, Osteoarthritis, Palpitations, Pneumonia, Prediabetes, Psoriasis, Psoriatic arthritis (HCC), Shortness of breath, and Vitamin D  deficiency.   They are presenting to PM&R clinic as a new patient for pain management evaluation.  [name] reports a history of psoriatic arthritis.  She has a lot of pain in her shoulders and back..  She has pain that radiates down her legs pain began about 20 years ago but has been worsening over time.  Her rheumatologist has also diagnosed with fibromyalgia.  She has had a prior TKR on the left and also on the right knee.  Pain is worsened by cold.  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  Her activity is decreased since her knee surgeries.  She reports some weight gain due to decreased activity.  Mood is often decreased.  She sleeps poorly most nights.  Has issues with occasional constipation.     No red flags for back pain endorsed in Hx or ROS, saddle anesthesia, loss of bowel or bladder continence, new weakness, new numbness/tingling, and pain waking up at nighttime.   Medications tried: Topical medications voltaren  gel Nsaids Naproxen - did not help, celebrex- minimal help, Voltaren  gel helps Tylenol  -takes all the time, helps a little Gabapentin - did not help Lyrica -appears she  had a low-dose of Lyrica  in the past TCAs  -amitriptyline made her sleepy SNRIs  -Has not tried  Other  Tizanidine  - does help a little, helps her sleep   Other treatments: PT/OT - after surgery, helped Accupuncture/chiropractor/massage - chiro treatment helped lower back, didn't feel comfortable on neck TENs unit - Used it a long time ago Injections -  Cortisone injection in back- helped for a long time  Surgery - TKA- both sides Other - heating bad helps     Interval History 11/02/22 She is here today for follow-up regarding her pain.  Reports her overall pain is doing better with duloxetine .  She initially had difficulty taking this medication is causing insomnia when she took it in the afternoon.  She started taking it in the morning and has been doing better.  She continues to have neck and lower back pain.  She also has joint pain in her knees and shoulders.  Her overall pain however is doing better since she started on this medicine.  She has been able to exercise more, doing stretches walking her dog and using an exercise bike.  She reports her mood is a little better as well.     Interval history 02/02/2023 Patient is here for follow-up regarding chronic pain and fibromyalgia.  Patient reports she continues to have soreness throughout multiple areas of her body.  She continues to have pain in her lower back and neck.  She does feel duloxetine  is helping reduce her overall pain.  She is not having any side effects with duloxetine .  While she continues to have pain,  she reports she is more active and doing a lot more than she used to previously.  TENS unit is also helping her overall pain.  She feels that her mood is doing okay overall.  She says she has lost weight.   Interval history 05/21/2023 Ms. Natasha Lyons reports that and the nerve type pain throughout her body is much decreased.  She feels like the duloxetine  is helping control this type of pain.  She is not having any side effects with  duloxetine .  She also continues to use her TENS unit to reduce her pain level.  She continues to have pain in her lower back and neck, however it is not limiting her activities and under control overall.  She continues to work on walking and doing band exercises with her upper extremities at home.  She has been successful in losing more weight since last visit.  Interval history 04/11/24 Hip/Back Pain: Reports new-onset pain for the past couple of weeks involving hips, but primarily the right side. Pain radiates from the right hip/low back down the right leg to the knee. It does not go past the knee. Pain is worse when standing up from a seated position and with initial ambulation. Reports a history of left-sided sciatica, but states this new right-sided pain is different and more severe. Also reports significant back tightness, which is chronic but feels worse and localized to the posterior hip/lower back joint area. Uses a TENS unit which provides some relief. Pain can sometimes radiate into the right groin.  Foot Pain: Reports Left foot pain after an acute twisting injury while exercising to prevent a fall. The pain is localized to the pinky toe, which has been wrapped for a few days. Notes swelling of the entire foot and ankle. Pain is spreading. Reports a history of plantar fasciitis, but current pain is different.   Medications: - Cymbalta : Reports it is working well muscle pains in arms and legs - Tizanidine : Takes at night. Helps with sleep, but has difficulty sleeping on the right side due to pain and numbness in the leg. - Tramadol : Reports it causes nausea and prefers to avoid it. - Prefers to avoid opioid medications. - Uses topical treatments like Biofreeze and patches.   Pain Inventory Average Pain 8 Pain Right Now 7 My pain is sharp, stabbing and aching  In the last 24 hours, has pain interfered with the following? General activity 8 Relation with others 10 Enjoyment of life  10 What TIME of day is your pain at its worst? morning  Sleep (in general) poor  Pain is worse with: standing and other Pain improves with: heat/ice, therapy/exercise, and TENS Relief from Meds: 5  Family History  Problem Relation Age of Onset   Breast cancer Mother    Diabetes Mother    Hypertension Mother    Hyperlipidemia Mother    Heart disease Mother    Stroke Mother    Kidney disease Mother    Thyroid  disease Mother    Obesity Mother    Hypertension Father    Social History   Socioeconomic History   Marital status: Married    Spouse name: Not on file   Number of children: 1   Years of education: Not on file   Highest education level: Not on file  Occupational History   Not on file  Tobacco Use   Smoking status: Never    Passive exposure: Past   Smokeless tobacco: Never  Vaping Use   Vaping status:  Never Used  Substance and Sexual Activity   Alcohol use: Yes    Comment: occ   Drug use: No   Sexual activity: Not Currently    Partners: Male    Birth control/protection: Post-menopausal    Comment: menarche 63yo, sexual debut 63yo  Other Topics Concern   Not on file  Social History Narrative   Not on file   Social Drivers of Health   Financial Resource Strain: Not on file  Food Insecurity: Not on file  Transportation Needs: Not on file  Physical Activity: Not on file  Stress: Not on file  Social Connections: Not on file   Past Surgical History:  Procedure Laterality Date   APPENDECTOMY     BIOPSY THYROID   2014   results benign   CHOLECYSTECTOMY  2008   DIAGNOSTIC LAPAROSCOPY     d/c endometriosis   DILATION AND CURETTAGE OF UTERUS     INSERTION OF MESH N/A 06/15/2016   Procedure: INSERTION OF MESH;  Surgeon: Deward Null III, MD;  Location: [facility];  Service: General;  Laterality: N/A;   KNEE ARTHROSCOPY Bilateral 2011   MASS EXCISION Right 04/21/2014   Procedure: EXCISION OF RIGHT BACK MASS;  Surgeon: Vicenta Poli, MD;  Location: [facility];  Service: General;  Laterality: Right;   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   REPLACEMENT TOTAL KNEE Left    05/2022   TONSILLECTOMY     VENTRAL HERNIA REPAIR  06/15/2016   VENTRAL HERNIA REPAIR N/A 06/15/2016   Procedure: LAPAROSCOPIC VENTRAL HERNIA WITH MESH;  Surgeon: Deward Null III, MD;  Location: [facility];  Service: General;  Laterality: N/A;   Past Surgical History:  Procedure Laterality Date   APPENDECTOMY     BIOPSY THYROID   2014   results benign   CHOLECYSTECTOMY  2008   DIAGNOSTIC LAPAROSCOPY     d/c endometriosis   DILATION AND CURETTAGE OF UTERUS     INSERTION OF MESH N/A 06/15/2016   Procedure: INSERTION OF MESH;  Surgeon: Deward Null III, MD;  Location: [facility];  Service: General;  Laterality: N/A;   KNEE ARTHROSCOPY Bilateral 2011   MASS EXCISION Right 04/21/2014   Procedure: EXCISION OF RIGHT BACK MASS;  Surgeon: Vicenta Poli, MD;  Location: [facility];  Service: General;  Laterality: Right;   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   REPLACEMENT TOTAL KNEE Left    05/2022   TONSILLECTOMY     VENTRAL HERNIA REPAIR  06/15/2016   VENTRAL HERNIA REPAIR N/A 06/15/2016   Procedure: LAPAROSCOPIC VENTRAL HERNIA WITH MESH;  Surgeon: Deward Null III, MD;  Location: [facility];  Service: General;  Laterality: N/A;   Past Medical History:  Diagnosis Date   Allergy    takes Zyrtec daily   Anemia    Anxiety    Arthritis    psorriatric arthritis   Asthma    Albuterol  as needed as well as neb as needed   Back pain    Bilateral swelling of feet    Chronic back pain    pinched nerve   Constipation    Cough    Depression    Family history of adverse reaction to anesthesia    pts dad gets sick with anesthesia   Fibromyalgia    takes Effexor  daily   Fibromyalgia    Gallbladder problem    GERD (gastroesophageal reflux disease)    Headache    seasonal    History of bronchitis 2016   History  of shingles    Hypertension    takes Amlodipine   daily   Hypothyroid    Infertility, female    Joint pain    Joint pain    Joint swelling    Muscle spasm    takes Robaxin  as needed   Osteoarthritis    Osteoarthritis    Palpitations    Pneumonia    hx of-10+ yrs ago   Prediabetes    Psoriasis    Psoriatic arthritis (HCC)    Shortness of breath    Vitamin D  deficiency    BP 135/85 (BP Location: Left Arm, Patient Position: Sitting, Cuff Size: Large)   Pulse 71   Ht 5' 4 (1.626 m)   Wt 198 lb 6.4 oz (90 kg)   LMP 07/10/2018   SpO2 98%   BMI 34.06 kg/m   Opioid Risk Score:   Fall Risk Score:  `1  Depression screen [facility] 2/9     04/11/2024    3:25 PM 10/16/2023    3:54 PM 05/21/2023    1:06 PM 05/21/2023   10:08 AM 02/02/2023   10:07 AM 11/15/2022    3:39 PM 11/02/2022    9:44 AM  Depression screen PHQ 2/9  Decreased Interest 0 1 0 0 0 0 0  Down, Depressed, Hopeless 0 0 0 0 0 0 0  PHQ - 2 Score 0 1 0 0 0 0 0      Review of Systems  Musculoskeletal:  Positive for back pain, myalgias and neck pain.       B/L shoulder pain  All other systems reviewed and are negative.      Objective:   Physical Exam  Gen: no distress, normal appearing HEENT: oral mucosa pink and moist, NCAT Chest: normal effort, normal rate of breathing Abd: soft, non-distended Ext: no edema Psych: pleasant, normal affect Skin: intact Neuro: Alert and oriented, follows commands, cranial nerves II through XII grossly intact, normal speech and language Strength 5/5 B/L UE and LE Sensation tact light touch in all 4 extremities Musculoskeletal:  Slump test negative Spurling's negative Back/Hip: - Tenderness to palpation over the posterior right hip and Right lateral low back. -Mild TTP over R greater trochanter  - Pain with external rotation of the right hip. Internal rotation provides some relief but still causes pressure. - Straight leg raise on the right elicits a muscle pull sensation but does not radiate down the leg. - Lumbar extension  is not very  painful. - Axial loading does not reproduce pain. - Antalgic gait observed (she reports Audible clicking in the hip with walking)   Foot/Ankle: - No significant swelling noted L  foot, pinky toe, and ankle. -Tenderness over the left foot greatest around 5th MTP and distal lateral foot.    MRI L spine 12/23/21 IMPRESSION: Multilevel degenerative changes as detailed above. No high-grade canal stenosis. Facet arthropathy with prominent joint effusions at L4-L5. There is increased mild to moderate foraminal narrowing at L4-L5. Similar moderate left foraminal narrowing at L5-S1 and potential displacement of the extraforaminal left L5 nerve root at this level.   MRI C spine 11/24/21 IMPRESSION: Multilevel degenerative changes as detailed above with minor progression since the prior examination. No significant canal narrowing. Foraminal narrowing is again greatest on the right at C6-C7.        Assessment & Plan:   Assessment and Plan: Conya Ellinwood is a 63 y.o. year old female  who  has a past medical history of Allergy, Anemia,  Anxiety, Arthritis, Asthma, Back pain, Bilateral swelling of feet, Chronic back pain, Constipation, Cough, Depression, Family history of adverse reaction to anesthesia, Fibromyalgia, Fibromyalgia, Gallbladder problem, GERD (gastroesophageal reflux disease), Headache, History of bronchitis (2016), History of shingles, Hypertension, Hypothyroid, Infertility, female, Joint pain, Joint pain, Joint swelling, Muscle spasm, Osteoarthritis, Osteoarthritis, Palpitations, Pneumonia, Prediabetes, Psoriasis, Psoriatic arthritis (HCC), Shortness of breath, and Vitamin D  deficiency.   They are presenting to PM&R clinic as a new patient for treatment of chronic pain syndrome   Fibromyalgia Continue gradual progressive low impact exercises Consider trying yoga or thai chi Continue Nexwave We previously discussed Lyrica , pt did not try due to concern of wt  gain Continue duloxetine  to 60 mg daily Discussed Theracane prior visit Discussed food options that can help pain prior visit Consider shockwave therapy at later visit Imaging: - X-ray of the left foot. - X-ray of the right hip and pelvis. - Orders placed for [facility]. Counseled to call ahead for walk-in availability. -Prescribed Journavx for acute pain flare, to be taken every 12 hours as needed for a short term use. Discussed risks and benefit    Chronic back and neck pain with lumbar and cervical spondylosis Consider consult physical therapy at later time if she has time to do this Previously had improvements with ESI -Continue Tizanidine  PRN -ordered -diagnostic medial branch block procedure for facet joint pain if physical therapy is not effective - Referral to physical therapy for low back and hip pain to address muscle imbalances and gait. - Referral placed for [place] [facility]) based on patient's work location. Patient will call to switch locations if a closer, preferred facility is found. -Suspect R lower back/Hip pain may be due to facet joint disease, think less likely hip joint related. Check xray hip as above. Consider [facility] later visit   Psoriatic arthritis -Continue follow-up with rheumatology, she is currently on methotrexate   Mood disorder -She is currently on bupropion  -Continue duloxetine  60mg  as above   05/07/24, Called patient, reviewed foot xray results. She has already seen podiatry. Scheduled for PT. Continues to have hip pain. Planning to pick up journavx soon.

## 2024-04-12 ENCOUNTER — Encounter: Payer: Self-pay | Admitting: Physical Medicine & Rehabilitation

## 2024-04-12 DIAGNOSIS — M79672 Pain in left foot: Secondary | ICD-10-CM

## 2024-04-12 DIAGNOSIS — M797 Fibromyalgia: Secondary | ICD-10-CM

## 2024-04-12 DIAGNOSIS — M25551 Pain in right hip: Secondary | ICD-10-CM

## 2024-04-12 DIAGNOSIS — G8929 Other chronic pain: Secondary | ICD-10-CM

## 2024-04-22 ENCOUNTER — Other Ambulatory Visit: Payer: Self-pay | Admitting: Emergency Medicine

## 2024-05-02 ENCOUNTER — Ambulatory Visit
Admission: RE | Admit: 2024-05-02 | Discharge: 2024-05-02 | Disposition: A | Discharge status: Home / Self Care | Source: Ambulatory Visit | Attending: Physical Medicine & Rehabilitation | Admitting: Physical Medicine & Rehabilitation

## 2024-05-02 DIAGNOSIS — M79672 Pain in left foot: Secondary | ICD-10-CM

## 2024-05-02 DIAGNOSIS — M25551 Pain in right hip: Secondary | ICD-10-CM

## 2024-05-06 ENCOUNTER — Ambulatory Visit: Admitting: Podiatry

## 2024-05-06 ENCOUNTER — Ambulatory Visit: Payer: Self-pay | Admitting: Physical Medicine & Rehabilitation

## 2024-05-06 DIAGNOSIS — S92515D Nondisplaced fracture of proximal phalanx of left lesser toe(s), subsequent encounter for fracture with routine healing: Secondary | ICD-10-CM

## 2024-05-06 DIAGNOSIS — M7752 Other enthesopathy of left foot: Secondary | ICD-10-CM | POA: Diagnosis not present

## 2024-05-06 NOTE — Progress Notes (Signed)
 Subjective:  Patient ID: Natasha Lyons, female    DOB: 02/19/1961,  MRN: [id number]  Chief Complaint  Patient presents with   Toe Pain    5th toe pain. Pt stated that she had xrays taken on 10/24     63 y.o. female presents with the above complaint.  Patient presents with left first metatarsophalangeal joint capsulitis as well as right fifth digit fracture.  She wanted to get it evaluated hurts with ambulation or shoe pressure and her big toe started to hurt she has a history of psoriatic arthritis has not seen anyone as prior to seeing me it is more painful and swollen.  She also has 1/5 digit fracture after she sustained a couple of weeks ago from falling down.  She wanted to discuss treatment options for this she had an x-ray done at the urgent care which shows fracture of the fifth toe on the left side.   Review of Systems: Negative except as noted in the HPI. Denies N/V/F/Ch.  Past Medical History:  Diagnosis Date   Allergy    takes Zyrtec daily   Anemia    Anxiety    Arthritis    psorriatric arthritis   Asthma    Albuterol  as needed as well as neb as needed   Back pain    Bilateral swelling of feet    Chronic back pain    pinched nerve   Constipation    Cough    Depression    Family history of adverse reaction to anesthesia    pts dad gets sick with anesthesia   Fibromyalgia    takes Effexor  daily   Fibromyalgia    Gallbladder problem    GERD (gastroesophageal reflux disease)    Headache    seasonal    History of bronchitis 2016   History of shingles    Hypertension    takes Amlodipine  daily   Hypothyroid    Infertility, female    Joint pain    Joint pain    Joint swelling    Muscle spasm    takes Robaxin  as needed   Osteoarthritis    Osteoarthritis    Palpitations    Pneumonia    hx of-10+ yrs ago   Prediabetes    Psoriasis    Psoriatic arthritis (HCC)    Shortness of breath    Vitamin D  deficiency     Current Outpatient Medications:     Acetaminophen  (TYLENOL  8 HOUR PO), Take by mouth., Disp: , Rfl:    albuterol  (PROVENTIL ) (2.5 MG/3ML) 0.083% nebulizer solution, Take 3 mLs (2.5 mg total) by nebulization every 6 (six) hours as needed for wheezing or shortness of breath., Disp: 75 mL, Rfl: 0   albuterol  (VENTOLIN  HFA) 108 (90 Base) MCG/ACT inhaler, INHALE 2 PUFFS INTO THE LUNGS EVERY 6 HOURS AS NEEDED FOR WHEEZE, Disp: 8.5 each, Rfl: 1   amLODipine  (NORVASC ) 5 MG tablet, TAKE 1 TABLET (5 MG TOTAL) BY MOUTH DAILY., Disp: 90 tablet, Rfl: 3   ARMOUR THYROID  15 MG tablet, Take 1 tablet (15 mg total) by mouth daily., Disp: 90 tablet, Rfl: 3   BD PEN NEEDLE NANO 2ND GEN 32G X 4 MM MISC, USE ONCE A WEEK, Disp: 100 each, Rfl: 0   blood glucose meter kit and supplies KIT, Dispense based on patient and insurance preference. Use up to four times daily as directed., Disp: 1 each, Rfl: 0   buPROPion  (WELLBUTRIN  XL) 150 MG 24 hr tablet, Take 1 tablet (150  mg total) by mouth daily., Disp: 90 tablet, Rfl: 3   cetirizine (ZYRTEC) 10 MG tablet, Take 10 mg by mouth daily., Disp: , Rfl:    clobetasol  ointment (TEMOVATE ) 0.05 %, APPLY 1 APPLICATION TOPICALLY 2 TIMES DAILY FOR 7 DAYS, THEN 1 APPLICATION 2 TIMES DAILY FOR 7 DAYS, Disp: 30 g, Rfl: 1   DULoxetine  (CYMBALTA ) 60 MG capsule, Take 1 capsule (60 mg total) by mouth daily., Disp: 90 capsule, Rfl: 3   Estradiol  10 MCG TABS vaginal tablet, Place 1 tablet (10 mcg total) vaginally 2 (two) times a week., Disp: 24 tablet, Rfl: 3   fluticasone  (FLONASE ) 50 MCG/ACT nasal spray, SPRAY 1 SPRAY INTO BOTH NOSTRILS DAILY., Disp: 16 mL, Rfl: 1   folic acid (FOLVITE) 800 MCG tablet, Take 800 mcg by mouth daily. , Disp: , Rfl:    methotrexate (RHEUMATREX) 2.5 MG tablet, Take 20 mg by mouth every Sunday. Caution:Chemotherapy. Protect from light., Disp: , Rfl:    montelukast  (SINGULAIR ) 10 MG tablet, TAKE 1 TABLET BY MOUTH EVERYDAY AT BEDTIME, Disp: 90 tablet, Rfl: 1   phentermine  (ADIPEX-P ) 37.5 MG tablet, 1 tablet  before breakfast by mouth Once a day for 30 days, Disp: , Rfl:    Secukinumab 150 MG/ML SOSY, Inject 300 mg into the skin every 30 (thirty) days., Disp: , Rfl:    Suzetrigine 50 MG TABS, Take 50 mg by mouth every 12 (twelve) hours as needed. Take two 50mg  capsules for your first dose at the start of a pain episode on an empty stomach, followed by 50mg  (1 capsule)  with or without food every 12 hours until pain episode is improved, Disp: 29 tablet, Rfl: 0   tiZANidine  (ZANAFLEX ) 2 MG tablet, TAKE 1 TABLET BY MOUTH EVERY 8 HOURS AS NEEDED FOR MUSCLE SPASMS., Disp: 90 tablet, Rfl: 2   topiramate  (TOPAMAX ) 25 MG tablet, TAKE 1 TABLET BY MOUTH EVERY DAY for 90, Disp: , Rfl:    Vitamin D , Ergocalciferol , (DRISDOL ) 1.25 MG (50000 UNIT) CAPS capsule, Take 1 capsule (50,000 Units total) by mouth every 7 (seven) days., Disp: 12 capsule, Rfl: 0   WIXELA INHUB 250-50 MCG/ACT AEPB, INHALE 1 PUFF INTO THE LUNGS IN THE MORNING AND AT BEDTIME., Disp: 60 each, Rfl: 7  Social History   Tobacco Use  Smoking Status Never   Passive exposure: Past  Smokeless Tobacco Never    Allergies  Allergen Reactions   Dust Mite Extract Shortness Of Breath   Grass Extracts [Gramineae Pollens] Shortness Of Breath   Tree Extract Shortness Of Breath    Certain tree allergies   Objective:  There were no vitals filed for this visit. There is no height or weight on file to calculate BMI. Constitutional Well developed. Well nourished.  Vascular Dorsalis pedis pulses palpable bilaterally. Posterior tibial pulses palpable bilaterally. Capillary refill normal to all digits.  No cyanosis or clubbing noted. Pedal hair growth normal.  Neurologic Normal speech. Oriented to person, place, and time. Epicritic sensation to light touch grossly present bilaterally.  Dermatologic Nails well groomed and normal in appearance. No open wounds. No skin lesions.  Orthopedic: Pain on palpation left fifth digit pain with range of motion of  the joint mild pain at the fifth metatarsophalangeal joint.  No ecchymosis bruising noted mild swelling noted  Left first MTP capsulitis pain with range of motion of the joint pain with end range of motion mild crepitus noted.  Swollen joint noted no redness noted.   Radiographs: 3 views of skeletally  mature adult left foot: Fifth probably proximal phalanx racture noted mild osteoarthritis of the first metatarsophalangeal joint noted. Assessment:   1. Closed nondisplaced fracture of proximal phalanx of lesser toe of left foot with routine healing, subsequent encounter   2. Capsulitis of metatarsophalangeal (MTP) joint of left foot    Plan:  Patient was evaluated and treated and all questions answered.  Left fifth proximal phalanx fracture nondisplaced - All questions and concerns were discussed with the patient in extensive detail at this time I encouraged her to wear surgical shoe and do buddy splinting as she was in her regular shoes after the fracture.  She states understanding will do so immediately - Surgical shoe was dispensed   Left first MTP capsulitis - All questions and concerns were discussed with the patient in extensive detail given the amount of pain that she is having should benefit from steroid injection patient agrees with plan of to proceed with steroid injection -A steroid injection was performed at left first MTP using 1% plain Lidocaine  and 10 mg of Kenalog . This was well tolerated.   No follow-ups on file.

## 2024-05-07 NOTE — Telephone Encounter (Signed)
 Patient returned your call.

## 2024-05-23 ENCOUNTER — Encounter: Admitting: Physical Medicine & Rehabilitation

## 2024-06-01 ENCOUNTER — Other Ambulatory Visit: Payer: Self-pay | Admitting: Physical Medicine & Rehabilitation

## 2024-06-03 ENCOUNTER — Other Ambulatory Visit: Payer: Self-pay | Admitting: Radiology

## 2024-06-03 DIAGNOSIS — N952 Postmenopausal atrophic vaginitis: Secondary | ICD-10-CM

## 2024-06-03 NOTE — Telephone Encounter (Signed)
 Med refill request:   Estradiol  10 MCG TABS vaginal tablet  Start:  04/19/23 Disp:  24 tablets Refills:  3  Last AEX:  04/18/23 Next AEX:  Not yet scheduled *Front desk has been asked to contact Pt to schedule an annual visit. Last MMG (if hormonal med): 09/06/22 Refill authorized? Please Advise.

## 2024-06-26 ENCOUNTER — Encounter: Admitting: Physical Medicine & Rehabilitation

## 2024-07-07 ENCOUNTER — Encounter: Admitting: Emergency Medicine

## 2024-07-17 ENCOUNTER — Encounter: Admitting: Physical Medicine & Rehabilitation

## 2024-07-22 ENCOUNTER — Encounter: Admitting: Emergency Medicine

## 2024-07-22 DIAGNOSIS — E66812 Obesity, class 2: Secondary | ICD-10-CM

## 2024-08-07 ENCOUNTER — Encounter: Admitting: Emergency Medicine

## 2024-08-08 ENCOUNTER — Encounter: Admitting: Physical Medicine & Rehabilitation

## 2024-08-18 ENCOUNTER — Encounter: Admitting: Emergency Medicine

## 2024-08-27 ENCOUNTER — Ambulatory Visit: Admitting: Radiology

## 2024-08-28 ENCOUNTER — Encounter: Admitting: Physical Medicine & Rehabilitation
